# Patient Record
Sex: Female | Born: 1935 | Race: Black or African American | Hispanic: No | Smoking: Former smoker
Health system: Southern US, Community
[De-identification: ages and names within clinical notes are randomized; demographics above are authoritative.]

## PROBLEM LIST (undated history)

## (undated) ENCOUNTER — Emergency Department (HOSPITAL_COMMUNITY): Admission: EM | Payer: Medicare Other

## (undated) DIAGNOSIS — F0390 Unspecified dementia without behavioral disturbance: Secondary | ICD-10-CM

## (undated) DIAGNOSIS — H5450 Low vision, one eye, unspecified eye: Secondary | ICD-10-CM

## (undated) DIAGNOSIS — I2510 Atherosclerotic heart disease of native coronary artery without angina pectoris: Secondary | ICD-10-CM

## (undated) DIAGNOSIS — M1990 Unspecified osteoarthritis, unspecified site: Secondary | ICD-10-CM

## (undated) DIAGNOSIS — I10 Essential (primary) hypertension: Secondary | ICD-10-CM

## (undated) DIAGNOSIS — E785 Hyperlipidemia, unspecified: Secondary | ICD-10-CM

## (undated) DIAGNOSIS — H5440 Blindness, one eye, unspecified eye: Secondary | ICD-10-CM

## (undated) HISTORY — DX: Blindness, one eye, unspecified eye: H54.40

## (undated) HISTORY — DX: Low vision, one eye, unspecified eye: H54.50

## (undated) HISTORY — DX: Essential (primary) hypertension: I10

## (undated) HISTORY — DX: Hyperlipidemia, unspecified: E78.5

## (undated) HISTORY — PX: CARDIAC CATHETERIZATION: SHX172

## (undated) HISTORY — PX: TONSILLECTOMY: SUR1361

## (undated) HISTORY — PX: EYE SURGERY: SHX253

## (undated) HISTORY — PX: OCULAR PROSTHESIS REMOVAL: SHX364

## (undated) HISTORY — DX: Atherosclerotic heart disease of native coronary artery without angina pectoris: I25.10

## (undated) HISTORY — PX: DILATION AND CURETTAGE OF UTERUS: SHX78

---

## 1998-10-12 ENCOUNTER — Emergency Department (HOSPITAL_COMMUNITY): Admission: EM | Admit: 1998-10-12 | Discharge: 1998-10-12 | Payer: Self-pay | Admitting: Emergency Medicine

## 1998-10-23 ENCOUNTER — Ambulatory Visit (HOSPITAL_COMMUNITY): Admission: RE | Admit: 1998-10-23 | Discharge: 1998-10-23 | Payer: Self-pay | Admitting: Family Medicine

## 1998-10-23 ENCOUNTER — Encounter: Payer: Self-pay | Admitting: Family Medicine

## 2000-01-20 ENCOUNTER — Emergency Department (HOSPITAL_COMMUNITY): Admission: EM | Admit: 2000-01-20 | Discharge: 2000-01-20 | Payer: Self-pay | Admitting: Internal Medicine

## 2000-02-29 ENCOUNTER — Ambulatory Visit (HOSPITAL_COMMUNITY): Admission: RE | Admit: 2000-02-29 | Discharge: 2000-02-29 | Payer: Self-pay | Admitting: Family Medicine

## 2000-02-29 ENCOUNTER — Encounter: Payer: Self-pay | Admitting: Family Medicine

## 2003-02-16 ENCOUNTER — Encounter: Payer: Self-pay | Admitting: Emergency Medicine

## 2003-02-16 ENCOUNTER — Emergency Department (HOSPITAL_COMMUNITY): Admission: EM | Admit: 2003-02-16 | Discharge: 2003-02-16 | Payer: Self-pay | Admitting: Emergency Medicine

## 2003-04-06 ENCOUNTER — Encounter: Payer: Self-pay | Admitting: Emergency Medicine

## 2003-04-06 ENCOUNTER — Emergency Department (HOSPITAL_COMMUNITY): Admission: EM | Admit: 2003-04-06 | Discharge: 2003-04-06 | Payer: Self-pay | Admitting: Emergency Medicine

## 2003-07-28 ENCOUNTER — Emergency Department (HOSPITAL_COMMUNITY): Admission: EM | Admit: 2003-07-28 | Discharge: 2003-07-29 | Payer: Self-pay | Admitting: Emergency Medicine

## 2003-08-02 ENCOUNTER — Emergency Department (HOSPITAL_COMMUNITY): Admission: EM | Admit: 2003-08-02 | Discharge: 2003-08-02 | Payer: Self-pay | Admitting: Emergency Medicine

## 2003-08-09 ENCOUNTER — Emergency Department (HOSPITAL_COMMUNITY): Admission: EM | Admit: 2003-08-09 | Discharge: 2003-08-09 | Payer: Self-pay | Admitting: Emergency Medicine

## 2003-08-31 ENCOUNTER — Ambulatory Visit (HOSPITAL_COMMUNITY): Admission: RE | Admit: 2003-08-31 | Discharge: 2003-08-31 | Payer: Self-pay | Admitting: Specialist

## 2003-10-12 ENCOUNTER — Encounter: Admission: RE | Admit: 2003-10-12 | Discharge: 2003-10-31 | Payer: Self-pay | Admitting: Specialist

## 2004-03-14 ENCOUNTER — Ambulatory Visit (HOSPITAL_COMMUNITY): Admission: RE | Admit: 2004-03-14 | Discharge: 2004-03-14 | Payer: Self-pay | Admitting: Specialist

## 2007-04-03 ENCOUNTER — Inpatient Hospital Stay (HOSPITAL_COMMUNITY): Admission: EM | Admit: 2007-04-03 | Discharge: 2007-04-04 | Payer: Self-pay | Admitting: Emergency Medicine

## 2007-04-03 ENCOUNTER — Ambulatory Visit: Payer: Self-pay | Admitting: Hospitalist

## 2007-04-03 ENCOUNTER — Ambulatory Visit: Payer: Self-pay | Admitting: Cardiology

## 2007-04-09 ENCOUNTER — Ambulatory Visit: Payer: Self-pay

## 2007-04-22 ENCOUNTER — Encounter (INDEPENDENT_AMBULATORY_CARE_PROVIDER_SITE_OTHER): Payer: Self-pay | Admitting: Internal Medicine

## 2007-04-22 ENCOUNTER — Ambulatory Visit: Payer: Self-pay | Admitting: Internal Medicine

## 2007-04-22 ENCOUNTER — Ambulatory Visit: Payer: Self-pay | Admitting: Cardiology

## 2007-04-22 DIAGNOSIS — Z8679 Personal history of other diseases of the circulatory system: Secondary | ICD-10-CM | POA: Insufficient documentation

## 2007-04-22 DIAGNOSIS — H409 Unspecified glaucoma: Secondary | ICD-10-CM | POA: Insufficient documentation

## 2007-04-22 DIAGNOSIS — E785 Hyperlipidemia, unspecified: Secondary | ICD-10-CM | POA: Insufficient documentation

## 2007-04-22 DIAGNOSIS — I10 Essential (primary) hypertension: Secondary | ICD-10-CM | POA: Insufficient documentation

## 2007-04-22 LAB — CONVERTED CEMR LAB
BUN: 16 mg/dL (ref 6–23)
CO2: 26 meq/L (ref 19–32)
Calcium: 8.9 mg/dL (ref 8.4–10.5)
Creatinine, Ser: 0.98 mg/dL (ref 0.40–1.20)
Glucose, Bld: 91 mg/dL (ref 70–99)

## 2007-04-30 ENCOUNTER — Encounter (INDEPENDENT_AMBULATORY_CARE_PROVIDER_SITE_OTHER): Payer: Self-pay | Admitting: Internal Medicine

## 2007-04-30 ENCOUNTER — Ambulatory Visit: Payer: Self-pay | Admitting: Internal Medicine

## 2007-04-30 LAB — CONVERTED CEMR LAB
ALT: 16 units/L (ref 0–35)
AST: 16 units/L (ref 0–37)
Alkaline Phosphatase: 132 units/L — ABNORMAL HIGH (ref 39–117)
Cholesterol: 172 mg/dL (ref 0–200)
Creatinine, Ser: 1.03 mg/dL (ref 0.40–1.20)
LDL Cholesterol: 95 mg/dL (ref 0–99)
Sodium: 141 meq/L (ref 135–145)
Total Bilirubin: 0.5 mg/dL (ref 0.3–1.2)
Total CHOL/HDL Ratio: 2.9
Total Protein: 7 g/dL (ref 6.0–8.3)
VLDL: 17 mg/dL (ref 0–40)

## 2007-05-12 ENCOUNTER — Ambulatory Visit: Payer: Self-pay | Admitting: Internal Medicine

## 2007-05-12 ENCOUNTER — Encounter (INDEPENDENT_AMBULATORY_CARE_PROVIDER_SITE_OTHER): Payer: Self-pay | Admitting: Internal Medicine

## 2007-05-12 LAB — CONVERTED CEMR LAB
BUN: 19 mg/dL (ref 6–23)
Potassium: 3.8 meq/L (ref 3.5–5.3)
Sodium: 143 meq/L (ref 135–145)

## 2007-06-15 ENCOUNTER — Ambulatory Visit: Payer: Self-pay | Admitting: *Deleted

## 2007-06-24 ENCOUNTER — Encounter: Admission: RE | Admit: 2007-06-24 | Discharge: 2007-08-26 | Payer: Self-pay | Admitting: Ophthalmology

## 2007-10-05 ENCOUNTER — Encounter (INDEPENDENT_AMBULATORY_CARE_PROVIDER_SITE_OTHER): Payer: Self-pay | Admitting: Infectious Diseases

## 2007-10-05 ENCOUNTER — Ambulatory Visit: Payer: Self-pay | Admitting: Hospitalist

## 2007-10-05 LAB — CONVERTED CEMR LAB
AST: 21 units/L (ref 0–37)
Albumin: 4.3 g/dL (ref 3.5–5.2)
Alkaline Phosphatase: 110 units/L (ref 39–117)
Calcium: 9.5 mg/dL (ref 8.4–10.5)
Chloride: 107 meq/L (ref 96–112)
Glucose, Bld: 89 mg/dL (ref 70–99)
Potassium: 3.8 meq/L (ref 3.5–5.3)
Sodium: 144 meq/L (ref 135–145)
Total Protein: 7 g/dL (ref 6.0–8.3)

## 2008-03-29 ENCOUNTER — Ambulatory Visit: Payer: Self-pay | Admitting: Infectious Diseases

## 2008-03-29 DIAGNOSIS — G4700 Insomnia, unspecified: Secondary | ICD-10-CM | POA: Insufficient documentation

## 2008-03-30 ENCOUNTER — Telehealth: Payer: Self-pay | Admitting: *Deleted

## 2008-04-04 ENCOUNTER — Ambulatory Visit (HOSPITAL_COMMUNITY): Admission: RE | Admit: 2008-04-04 | Discharge: 2008-04-04 | Payer: Self-pay | Admitting: Internal Medicine

## 2008-04-04 ENCOUNTER — Encounter (INDEPENDENT_AMBULATORY_CARE_PROVIDER_SITE_OTHER): Payer: Self-pay | Admitting: Internal Medicine

## 2008-04-20 ENCOUNTER — Encounter (INDEPENDENT_AMBULATORY_CARE_PROVIDER_SITE_OTHER): Payer: Self-pay | Admitting: Internal Medicine

## 2008-06-02 ENCOUNTER — Telehealth (INDEPENDENT_AMBULATORY_CARE_PROVIDER_SITE_OTHER): Payer: Self-pay | Admitting: Internal Medicine

## 2008-06-10 ENCOUNTER — Ambulatory Visit: Payer: Self-pay | Admitting: Internal Medicine

## 2008-10-07 ENCOUNTER — Encounter (INDEPENDENT_AMBULATORY_CARE_PROVIDER_SITE_OTHER): Payer: Self-pay | Admitting: Internal Medicine

## 2008-10-07 ENCOUNTER — Ambulatory Visit: Payer: Self-pay | Admitting: Internal Medicine

## 2008-10-10 ENCOUNTER — Ambulatory Visit: Payer: Self-pay | Admitting: *Deleted

## 2008-10-10 ENCOUNTER — Telehealth (INDEPENDENT_AMBULATORY_CARE_PROVIDER_SITE_OTHER): Payer: Self-pay | Admitting: Internal Medicine

## 2008-10-10 LAB — CONVERTED CEMR LAB
AST: 18 units/L (ref 0–37)
Albumin: 4.4 g/dL (ref 3.5–5.2)
Alkaline Phosphatase: 113 units/L (ref 39–117)
LDL Cholesterol: 114 mg/dL — ABNORMAL HIGH (ref 0–99)
MCHC: 33.7 g/dL (ref 30.0–36.0)
Potassium: 4.1 meq/L (ref 3.5–5.3)
RBC: 4.5 M/uL (ref 3.87–5.11)
RDW: 13.4 % (ref 11.5–15.5)
Sodium: 142 meq/L (ref 135–145)
Total Protein: 7.2 g/dL (ref 6.0–8.3)

## 2009-02-14 ENCOUNTER — Ambulatory Visit: Payer: Self-pay | Admitting: Internal Medicine

## 2009-02-14 ENCOUNTER — Encounter (INDEPENDENT_AMBULATORY_CARE_PROVIDER_SITE_OTHER): Payer: Self-pay | Admitting: *Deleted

## 2009-02-14 DIAGNOSIS — I739 Peripheral vascular disease, unspecified: Secondary | ICD-10-CM | POA: Insufficient documentation

## 2009-02-14 LAB — CONVERTED CEMR LAB
ALT: 13 units/L (ref 0–35)
CO2: 26 meq/L (ref 19–32)
Calcium: 9 mg/dL (ref 8.4–10.5)
Chloride: 103 meq/L (ref 96–112)
GFR calc Af Amer: 52 mL/min — ABNORMAL LOW (ref >60)
HDL: 61 mg/dL (ref >39)
LDL Cholesterol: 80 mg/dL (ref 0–99)
Potassium: 3.4 meq/L — ABNORMAL LOW (ref 3.5–5.3)
Sodium: 143 meq/L (ref 135–145)
Total Protein: 7.5 g/dL (ref 6.0–8.3)

## 2009-02-20 ENCOUNTER — Encounter (INDEPENDENT_AMBULATORY_CARE_PROVIDER_SITE_OTHER): Payer: Self-pay | Admitting: *Deleted

## 2009-02-20 ENCOUNTER — Ambulatory Visit: Payer: Self-pay | Admitting: Vascular Surgery

## 2009-02-20 ENCOUNTER — Ambulatory Visit (HOSPITAL_COMMUNITY): Admission: RE | Admit: 2009-02-20 | Discharge: 2009-02-20 | Payer: Self-pay | Admitting: *Deleted

## 2009-03-01 ENCOUNTER — Encounter: Payer: Self-pay | Admitting: Internal Medicine

## 2009-03-01 ENCOUNTER — Ambulatory Visit: Payer: Self-pay | Admitting: Internal Medicine

## 2009-03-01 LAB — CONVERTED CEMR LAB
CO2: 25 meq/L (ref 19–32)
Calcium: 9.6 mg/dL (ref 8.4–10.5)
Chloride: 103 meq/L (ref 96–112)
Sodium: 142 meq/L (ref 135–145)

## 2009-03-24 ENCOUNTER — Ambulatory Visit: Payer: Self-pay | Admitting: Internal Medicine

## 2009-03-24 LAB — CONVERTED CEMR LAB
ALT: 29 units/L (ref 0–35)
CO2: 24 meq/L (ref 19–32)
Calcium: 9.3 mg/dL (ref 8.4–10.5)
Chloride: 104 meq/L (ref 96–112)
Creatinine, Ser: 1.01 mg/dL (ref 0.40–1.20)

## 2009-04-04 ENCOUNTER — Encounter: Admission: RE | Admit: 2009-04-04 | Discharge: 2009-05-04 | Payer: Self-pay | Admitting: Internal Medicine

## 2009-04-19 ENCOUNTER — Encounter: Payer: Self-pay | Admitting: Internal Medicine

## 2009-05-03 ENCOUNTER — Encounter: Payer: Self-pay | Admitting: Internal Medicine

## 2009-05-25 ENCOUNTER — Ambulatory Visit: Payer: Self-pay | Admitting: Internal Medicine

## 2009-10-10 ENCOUNTER — Ambulatory Visit: Payer: Self-pay | Admitting: Internal Medicine

## 2009-10-10 LAB — CONVERTED CEMR LAB
CO2: 26 meq/L (ref 19–32)
Calcium: 9.7 mg/dL (ref 8.4–10.5)
Cholesterol: 181 mg/dL (ref 0–200)
HDL: 70 mg/dL (ref >39)
Sodium: 142 meq/L (ref 135–145)
Total CHOL/HDL Ratio: 2.6
Triglycerides: 77 mg/dL (ref <150)

## 2009-10-25 ENCOUNTER — Ambulatory Visit (HOSPITAL_COMMUNITY): Admission: RE | Admit: 2009-10-25 | Discharge: 2009-10-25 | Payer: Self-pay | Admitting: Internal Medicine

## 2009-11-01 ENCOUNTER — Telehealth: Payer: Self-pay | Admitting: Internal Medicine

## 2009-12-27 ENCOUNTER — Telehealth: Payer: Self-pay | Admitting: Internal Medicine

## 2009-12-29 ENCOUNTER — Ambulatory Visit: Payer: Self-pay | Admitting: Internal Medicine

## 2010-03-22 ENCOUNTER — Ambulatory Visit: Payer: Self-pay | Admitting: Internal Medicine

## 2010-03-22 DIAGNOSIS — G5600 Carpal tunnel syndrome, unspecified upper limb: Secondary | ICD-10-CM | POA: Insufficient documentation

## 2010-04-25 ENCOUNTER — Telehealth: Payer: Self-pay | Admitting: Internal Medicine

## 2010-06-28 ENCOUNTER — Telehealth: Payer: Self-pay | Admitting: Internal Medicine

## 2010-08-10 ENCOUNTER — Ambulatory Visit: Payer: Self-pay | Admitting: Internal Medicine

## 2010-08-10 DIAGNOSIS — R42 Dizziness and giddiness: Secondary | ICD-10-CM | POA: Insufficient documentation

## 2010-09-25 NOTE — Assessment & Plan Note (Signed)
Summary: increased BP/pcp-magick/hla   Vital Signs:  Patient profile:   75 year old female Height:      64 inches (162.56 cm) Weight:      171.06 pounds (77.75 kg) BMI:     29.47 Temp:     97 degrees F (36.11 degrees C) oral BP sitting:   142 / 78  (right arm)  Vitals Entered By: Angelina Ok RN (Dec 29, 2009 1:57 PM) CC: Depression Is Patient Diabetic? No Pain Assessment Patient in pain? no      Nutritional Status BMI of 25 - 29 = overweight  Have you ever been in a relationship where you felt threatened, hurt or afraid?No   Does patient need assistance? Functional Status Self care Comments Gets stressed when she leaves the hous fro anything.  Had gone to [facility] the day her B/P was up.  Says that her head feels funny.  Here to have blood pressure checked.   Primary Care Provider:  Mliss Sax MD  CC:  Depression.  History of Present Illness: 15 year woman with pmh signifcant for HTN, HLD, Glaucoma, and non-obstructive CAD (nl myoview 2008) who presents to the clinic for the following.  Patient checked her BP at [facility] recently and her BP was is 150's and got scared and scheduled an appointment. She denies any other complaints. She reports that she has an appointment with ocularis this money and reports that she will get a new eye ball to her righteye.  No other complaints or concerns.   Depression History:      The patient denies a depressed mood most of the day and a diminished interest in her usual daily activities.        The patient denies that she feels like life is not worth living, denies that she wishes that she were dead, and denies that she has thought about ending her life.        Comments:  Down about blindness but goes on.   Preventive Screening-Counseling & Management  Alcohol-Tobacco     Smoking Status: quit     Year Quit: 22 YEARS AGO     Passive Smoke Exposure: no  Problems Prior to Update: 1)  Onychomycosis  (ICD-110.1) 2)  Intermittent  Claudication, Left Leg  (ICD-443.9) 3)  Uri  (ICD-465.9) 4)  Plantar Fasciitis, Bilateral  (ICD-728.71) 5)  Insomnia  (ICD-780.52) 6)  Back Pain  (ICD-724.5) 7)  Postmenopausal Status  (ICD-627.2) 8)  Chest Pain, Hx of  (ICD-V12.50) 9)  Glaucoma Nos  (ICD-365.9) 10)  Hypertension  (ICD-401.9) 11)  Hyperlipidemia  (ICD-272.4) 12)  Aftercare, Long-term Use, Medications Nec  (ICD-V58.69)  Medications Prior to Update: 1)  Hydrochlorothiazide 25 Mg  Tabs (Hydrochlorothiazide) .... Take 1 Tablet By Mouth Once A Day 2)  Atenolol 25 Mg  Tabs (Atenolol) .... Take 1 Tablet By Mouth Once A Day 3)  Vytorin 10-20 Mg Tabs (Ezetimibe-Simvastatin) .... Take 1 Tablet By Mouth Once A Day 4)  Alphagan P 0.1 %  Soln (Brimonidine Tartrate) .[name].. 1 Drop On Each Eye Twice A Day 5)  Pataday 0.2 %  Soln (Olopatadine Hcl) .... One Dropin Ight Eye Once A Day 6)  Cosopt 2-0.5 %  Soln (Dorzolamide-Timolol) .[name].. 1 Drop in Left Eye Twice A Day 7)  Lumigan 0.03 %  Soln (Bimatoprost) .[name].. 1 Drop in Left Eye Bedtime 8)  Nitroquick 0.4 Mg  Subl (Nitroglycerin) .... As Needed For Chest Pain Every 5 Minutes 9)  Norvasc 5 Mg  Tabs (Amlodipine Besylate) .... Take 1 Tablet By Mouth Once A Day 10)  Tylenol Extra Strength 500 Mg Tabs (Acetaminophen) .... Take One Pill Every 6 Hours As Needed For Pain. 11)  Flexeril 5 Mg Tabs (Cyclobenzaprine Hcl) .... Take One Pill Every By Mouth At Bedtime 12)  Arthrotec 75 75-200 Mg-Mcg Tabs (Diclofenac-Misoprostol) .... Take 1 Tablet By Mouth Two Times A Day 13)  Terbinafine Hcl 1 % Crea (Terbinafine Hcl) .... Please Apply A Small Amount Under Nail of Both Thumbs, Rub in Thoroughly Twice Daily.  Current Medications (verified): 1)  Hydrochlorothiazide 25 Mg  Tabs (Hydrochlorothiazide) .... Take 1 Tablet By Mouth Once A Day 2)  Atenolol 25 Mg  Tabs (Atenolol) .... Take 1 Tablet By Mouth Once A Day 3)  Vytorin 10-20 Mg Tabs (Ezetimibe-Simvastatin) .... Take 1 Tablet By Mouth Once A Day 4)   Alphagan P 0.1 %  Soln (Brimonidine Tartrate) .[name].. 1 Drop On Each Eye Twice A Day 5)  Pataday 0.2 %  Soln (Olopatadine Hcl) .... One Dropin Ight Eye Once A Day 6)  Cosopt 2-0.5 %  Soln (Dorzolamide-Timolol) .[name].. 1 Drop in Left Eye Twice A Day 7)  Lumigan 0.03 %  Soln (Bimatoprost) .[name].. 1 Drop in Left Eye Bedtime 8)  Nitroquick 0.4 Mg  Subl (Nitroglycerin) .... As Needed For Chest Pain Every 5 Minutes 9)  Norvasc 5 Mg  Tabs (Amlodipine Besylate) .... Take 1 Tablet By Mouth Once A Day 10)  Tylenol Extra Strength 500 Mg Tabs (Acetaminophen) .... Take One Pill Every 6 Hours As Needed For Pain. 11)  Flexeril 5 Mg Tabs (Cyclobenzaprine Hcl) .... Take One Pill Every By Mouth At Bedtime 12)  Arthrotec 75 75-200 Mg-Mcg Tabs (Diclofenac-Misoprostol) .... Take 1 Tablet By Mouth Two Times A Day 13)  Terbinafine Hcl 1 % Crea (Terbinafine Hcl) .... Please Apply A Small Amount Under Nail of Both Thumbs, Rub in Thoroughly Twice Daily.  Allergies (verified): 1)  ! Asa 2)  ! Codeine  Past History:  Risk Factors: Exercise: no (05/25/2009)  Risk Factors: Smoking Status: quit (12/29/2009) Passive Smoke Exposure: no (12/29/2009)  Past Medical History: Reviewed history from 06/15/2007 and no changes required. Hyperlipidemia Hypertension Nonobstructive CAD - myoview nl 03/2007 by [facility], EF = 75%  Review of Systems      See HPI  Physical Exam  General:  alert and well-developed.  legally blind, wearing subglasses.  Head:  normocephalic and atraumatic.   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 Mouth:  pharynx pink and moist.   Neck:  supple, full ROM, no masses, and no thyromegaly.   Lungs:  normal respiratory effort and normal breath sounds.   Heart:  normal rate, regular rhythm, no murmur, no gallop, no rub, and no JVD.   Abdomen:  soft, non-tender, normal bowel sounds, no distention, no masses, no guarding, and no rigidity.   Pulses:  R radial  normal.   Extremities:  no lower extremity edema  Neurologic:  alert & oriented X3, strength normal in all extremities, and gait normal.     Impression & Recommendations:  Problem # 1:  HYPERTENSION (ICD-401.9) Well controlled. Continue current regimen. Explained her about the positional changes in BP and adised her not to worry for values of 140or 150's.  Her updated medication list for this problem includes:    Hydrochlorothiazide 25 Mg Tabs (Hydrochlorothiazide) .[name]... Take 1 tablet by mouth once a day    Atenolol 25 Mg Tabs (  Atenolol) .[name]... Take 1 tablet by mouth once a day    Norvasc 5 Mg Tabs (Amlodipine besylate) .[name]... Take 1 tablet by mouth once a day  BP today: 142/78 Prior BP: 170/82 (10/10/2009)  Labs Reviewed: K+: 3.9 (10/10/2009) Creat: : 0.85 (10/10/2009)   Chol: 181 (10/10/2009)   HDL: 70 (10/10/2009)   LDL: 96 (10/10/2009)   TG: 77 (10/10/2009)  Problem # 2:  GLAUCOMA NOS (ICD-365.9) Follows with opthalmology in [place] and ocularis at [facility], No active issues.  Complete Medication List: 1)  Hydrochlorothiazide 25 Mg Tabs (Hydrochlorothiazide) .... Take 1 tablet by mouth once a day 2)  Atenolol 25 Mg Tabs (Atenolol) .... Take 1 tablet by mouth once a day 3)  Vytorin 10-20 Mg Tabs (Ezetimibe-simvastatin) .... Take 1 tablet by mouth once a day 4)  Alphagan P 0.1 % Soln (Brimonidine tartrate) .[name].. 1 drop on each eye twice a day 5)  Pataday 0.2 % Soln (Olopatadine hcl) .... One dropin ight eye once a day 6)  Cosopt 2-0.5 % Soln (Dorzolamide-timolol) .[name].. 1 drop in left eye twice a day 7)  Lumigan 0.03 % Soln (Bimatoprost) .[name].. 1 drop in left eye bedtime 8)  Nitroquick 0.4 Mg Subl (Nitroglycerin) .... As needed for chest pain every 5 minutes 9)  Norvasc 5 Mg Tabs (Amlodipine besylate) .... Take 1 tablet by mouth once a day 10)  Tylenol Extra Strength 500 Mg Tabs (Acetaminophen) .... Take one pill every 6 hours as needed for pain. 11)  Flexeril 5 Mg Tabs  (Cyclobenzaprine hcl) .... Take one pill every by mouth at bedtime 12)  Arthrotec 75 75-200 Mg-mcg Tabs (Diclofenac-misoprostol) .... Take 1 tablet by mouth two times a day 13)  Terbinafine Hcl 1 % Crea (Terbinafine hcl) .... Please apply a small amount under nail of both thumbs, rub in thoroughly twice daily.  Patient Instructions: 1)  Please schedule a follow-up appointment in 3 months. 2)  Take all the medications as advised below.   Vital Signs:  Patient profile:   75 year old female Height:      64 inches (162.56 cm) Weight:      171.06 pounds (77.75 kg) BMI:     29.47 Temp:     97 degrees F (36.11 degrees C) oral BP sitting:   142 / 78  (right arm)  Vitals Entered By: Angelina Ok RN (Dec 29, 2009 1:57 PM)   Prevention & Chronic Care Immunizations   Influenza vaccine: Fluvax 3+  (05/25/2009)   Influenza vaccine deferral: Deferred  (03/01/2009)   Influenza vaccine due: 04/26/2009    Tetanus booster: Not documented   Td booster deferral: Deferred  (03/01/2009)    Pneumococcal vaccine: Not documented   Pneumococcal vaccine deferral: Deferred  (03/01/2009)    H. zoster vaccine: Not documented   H. zoster vaccine deferral: Deferred  (03/01/2009)  Colorectal Screening   Hemoccult: Not documented   Hemoccult action/deferral: Deferred  (03/01/2009)    Colonoscopy: Not documented   Colonoscopy action/deferral: Refused  (10/10/2009)  Other Screening   Pap smear: Not documented   Pap smear action/deferral: Refused  (10/10/2009)    Mammogram: ASSESSMENT: Negative - BI-RADS 1^MM DIGITAL SCREENING  (10/25/2009)   Mammogram action/deferral: Ordered  (10/10/2009)   Mammogram due: 03/2009    DXA bone density scan: Not documented   Smoking status: quit  (12/29/2009)  Lipids   Total Cholesterol: 181  (10/10/2009)   Lipid panel action/deferral: Lipid Panel ordered   LDL: 96  (10/10/2009)  LDL Direct: Not documented   HDL: 70  (10/10/2009)   Triglycerides: 77   (10/10/2009)    SGOT (AST): 25  (03/24/2009)   SGPT (ALT): 29  (03/24/2009)   Alkaline phosphatase: 114  (03/24/2009)   Total bilirubin: 0.4  (03/24/2009)  Hypertension   Last Blood Pressure: 142 / 78  (12/29/2009)   Serum creatinine: 0.85  (10/10/2009)   BMP action: Ordered   Serum potassium 3.9  (10/10/2009)  Self-Management Support :   Personal Goals (by the next clinic visit) :      Personal blood pressure goal: 140/90  (10/10/2009)     Personal LDL goal: 70  (10/10/2009)    Patient will work on the following items until the next clinic visit to reach self-care goals:     Medications and monitoring: take my medicines every day, bring all of my medications to every visit  (12/29/2009)     Eating: drink diet soda or water instead of juice or soda, eat more vegetables, use fresh or frozen vegetables, eat foods that are low in salt, eat baked foods instead of fried foods, eat fruit for snacks and desserts, limit or avoid alcohol  (12/29/2009)     Activity: take a 30 minute walk every day  (12/29/2009)     Other: trying to watch salt but really misses it- treadmill for 1 hour a day  (05/25/2009)    Hypertension self-management support: Written self-care plan, Education handout, Pre-printed educational material, Resources for patients handout  (12/29/2009)   Hypertension self-care plan printed.   Hypertension education handout printed    Lipid self-management support: Written self-care plan, Education handout, Pre-printed educational material, Resources for patients handout  (12/29/2009)   Lipid self-care plan printed.   Lipid education handout printed      Resource handout printed.

## 2010-09-25 NOTE — Progress Notes (Signed)
Summary: immobilizer removal/ hla  Phone Note Call from Patient   Summary of Call: pt presents for appt scheduled 8/30, she states she thought it was today, she states this was a f/u from appt 7/28, back to remove immobilizer from left hand/forearm. she states hand is much improved, denies pain today, states a twinge every now and then but " no where near what it was", she desires to take immobilizer off, spoke w/ dr Josem Kaufmann, will allow pt to remove immobilizer w/ instructions that if pain starts again or any other problems arise w/ L hand and f/a she is to call clinic or go to ED or urg care. if no problems she will f/u w/ pcp at next available clinic day, that appt is scheduled today. pt is agreeable. she removed the immobilizer herself as i observed and states it feels fine. she is happy with not seeing a physician today. Initial call taken by: Marin Roberts RN,  April 25, 2010 3:40 PM  Follow-up for Phone Call        I spoke with Ms. Alfredo Bach concerning Ms. Hlavaty.  The plan for the visit yesterday was to reassess and possibly discontinue the shoulder immobilizer.  Ms. Kage reportedly is symptomatically improved so it is OK to discontinue it and follow symptoms.  If her shoulder pain recurs she should make an appointment to be seen in the clinic.  Otherwise she can follow-up with her PCP at the next available appointment. Follow-up by: Doneen Poisson MD,  April 25, 2010 4:56 PM

## 2010-09-25 NOTE — Progress Notes (Signed)
Summary: refill/ hla  Phone Note Refill Request Message from:  Fax from Pharmacy on November 01, 2009 2:04 PM  Refills Requested: Medication #1:  ARTHROTEC 75 75-200 MG-MCG TABS Take 1 tablet by mouth two times a day   Last Refilled: 2/15  Medication #2:  FLEXERIL 5 MG TABS Take one pill every by mouth at bedtime   Last Refilled: 2/15 last visit 2/15  Initial call taken by: Marin Roberts RN,  November 01, 2009 2:04 PM  Follow-up for Phone Call        completed Follow-up by: Mliss Sax MD,  November 01, 2009 2:50 PM    Prescriptions: FLEXERIL 5 MG TABS (CYCLOBENZAPRINE HCL) Take one pill every by mouth at bedtime  #14 x 3   Entered and Authorized by:   Mliss Sax MD   Signed by:   Mliss Sax MD on 11/01/2009   Method used:   Electronically to        [facility] [address] [id number]* (retail)       [address] [place], [place]  [address]       Ph: [phone]       Fax: [phone]   RxID:   [id number] ARTHROTEC 75 75-200 MG-MCG TABS (DICLOFENAC-MISOPROSTOL) Take 1 tablet by mouth two times a day  #30 x 3   Entered and Authorized by:   Mliss Sax MD   Signed by:   Mliss Sax MD on 11/01/2009   Method used:   Electronically to        [facility] [address] [id number]* (retail)       [address] [place], [place]  [address]       Ph: [phone]       Fax: [phone]   RxID:   [id number]

## 2010-09-25 NOTE — Assessment & Plan Note (Signed)
Summary: ACUTE-BACK PAIN/LEG PAIN(MAGICK)/CFB   Vital Signs:  Patient profile:   75 year old female Height:      64 inches (162.56 cm) Weight:      167.8 pounds (76.27 kg) BMI:     28.91 Temp:     98.6 degrees F (37.00 degrees C) Pulse rate:   72 / minute BP sitting:   170 / 82  (left arm) Cuff size:   large  Vitals Entered By: Krystal Eaton Duncan Dull) (October 10, 2009 10:58 AM) CC: pt c/o low back/right hip pain x5-6 days Is Patient Diabetic? No Pain Assessment Patient in pain? yes     Location: right hip/low back Intensity: 4 Type: dull Onset of pain  intermittent  x 5-6 days mostly with activity Nutritional Status BMI of 25 - 29 = overweight  Have you ever been in a relationship where you felt threatened, hurt or afraid?No   Does patient need assistance? Functional Status Cook/clean, Shopping Ambulation Impaired:Risk for fall Comments almost completely blind   Primary Care Provider:  Carlus Pavlov MD  CC:  pt c/o low back/right hip pain x5-6 days.  History of Present Illness: [name] is a 75 year old woman with pmh signifcant for HTN, HLD, Glaucoma, and non-obstructive CAD (nl myoview 2008) who presents today for general check up and back pain.  Patient has a long-standing history of back pain and has received PT in the past. Pt reports she was cleaning her closet and felt she had strained it. Injury occured 10/05/2009. Pt states the pain has improved, but is still causing discomfort. She also complains of skin irritation and cracking under her thumbs. She reports she washes her hands quite often and that it has been getting worse when she washes her hands.   No other complaints or concerns.   Preventive Screening-Counseling & Management  Alcohol-Tobacco     Smoking Status: quit     Year Quit: 22 YEARS AGO     Passive Smoke Exposure: no  Current Medications (verified): 1)  Hydrochlorothiazide 25 Mg  Tabs (Hydrochlorothiazide) .... Take 1/2 Tablet By  Mouth Once A Day 2)  Atenolol 25 Mg  Tabs (Atenolol) .... Take 1 Tablet By Mouth Once A Day 3)  Vytorin 10-20 Mg Tabs (Ezetimibe-Simvastatin) .... Take 1 Tablet By Mouth Once A Day 4)  Alphagan P 0.1 %  Soln (Brimonidine Tartrate) .[name].. 1 Drop On Each Eye Twice A Day 5)  Pataday 0.2 %  Soln (Olopatadine Hcl) .... One Dropin Ight Eye Once A Day 6)  Cosopt 2-0.5 %  Soln (Dorzolamide-Timolol) .[name].. 1 Drop in Left Eye Twice A Day 7)  Lumigan 0.03 %  Soln (Bimatoprost) .[name].. 1 Drop in Left Eye Bedtime 8)  Nitroquick 0.4 Mg  Subl (Nitroglycerin) .... As Needed For Chest Pain Every 5 Minutes 9)  Norvasc 5 Mg  Tabs (Amlodipine Besylate) .... Take 1 Tablet By Mouth Once A Day 10)  Tylenol Extra Strength 500 Mg Tabs (Acetaminophen) .... Take One Pill Every 6 Hours As Needed For Pain. 11)  Flexeril 5 Mg Tabs (Cyclobenzaprine Hcl) .... Take One Pill Every By Mouth At Bedtime 12)  Arthrotec 75 75-200 Mg-Mcg Tabs (Diclofenac-Misoprostol) .... Take 1 Tablet By Mouth Two Times A Day  Allergies: 1)  ! Asa 2)  ! Codeine  Past History:  Past Medical History: Last updated: 06/15/2007 Hyperlipidemia Hypertension Nonobstructive CAD - myoview nl 03/2007 by Valera [facility], EF = 75%  Risk Factors: Exercise: no (05/25/2009)  Risk Factors: Smoking Status:  quit (10/10/2009) Passive Smoke Exposure: no (10/10/2009)  Review of Systems CV:  Denies chest pain or discomfort, difficulty breathing at night, difficulty breathing while lying down, palpitations, and swelling of feet. GI:  Denies abdominal pain, constipation, diarrhea, nausea, and vomiting. MS:  Complains of low back pain. Derm:  Complains of changes in nail beds. Neuro:  Denies brief paralysis, falling down, numbness, tingling, and weakness.  Physical Exam  General:  alert and well-developed.   Head:  normocephalic and atraumatic.   Eyes:  glaucoma bilaterally, complete vision loss in right left, very little vision in left eye  Mouth:  pharynx pink  and moist.   Neck:  supple, full ROM, no masses, and no thyromegaly.   Lungs:  normal respiratory effort and normal breath sounds.   Heart:  normal rate, regular rhythm, no murmur, no gallop, no rub, and no JVD.   Abdomen:  soft, non-tender, normal bowel sounds, no distention, no masses, no guarding, and no rigidity.   Msk:  decreased ROM 2/2 to pain no joint swelling, no joint warmth, and decreased ROM.   Pulses:  R radial normal and L radial normal.   Extremities:  no lower extremity edema  Neurologic:  alert & oriented X3, cranial nerves II-XII intact, and strength normal in all extremities.   Skin:  cracking of skin under thumbs, under nail bed, no discolouration of nails   Impression & Recommendations:  Problem # 1:  BACK PAIN (ICD-724.5) Assessment Deteriorated Back pain 2/2 lumbar strain. No neurologic deficits. Advised patient to maintain activity, avoid complete bed rest, heat and ice packs. Will also give patient flexeril and arthotec as patient states these medications have helped in the past for acute back pain.   Her updated medication list for this problem includes:    Tylenol Extra Strength 500 Mg Tabs (Acetaminophen) .[name]... Take one pill every 6 hours as needed for pain.    Flexeril 5 Mg Tabs (Cyclobenzaprine hcl) .[name]... Take one pill every by mouth at bedtime    Arthrotec 75 75-200 Mg-mcg Tabs (Diclofenac-misoprostol) .[name]... Take 1 tablet by mouth two times a day  Problem # 2:  HYPERTENSION (ICD-401.9) Assessment: Deteriorated BP manually rechecked 140/90. Blood pressure is more elevated than baseline and this is likely due to pain. I instructed patient to go back to taking 1 tablet of HCTZ as opposed to 1/2 tab that she was taking before. Will check BMet for renal function and K.   Her updated medication list for this problem includes:    Hydrochlorothiazide 25 Mg Tabs (Hydrochlorothiazide) .[name]... Take 1 tablet by mouth once a day    Atenolol 25 Mg Tabs (Atenolol) .[name]...  Take 1 tablet by mouth once a day    Norvasc 5 Mg Tabs (Amlodipine besylate) .[name]... Take 1 tablet by mouth once a day  Orders: T-Basic Metabolic Panel [id number])  Problem # 3:  ONYCHOMYCOSIS (ICD-110.1) Assessment: New Cracked skin under nail bed of both thumbs. This is likely due to onychomycosis, however presents atypically, as there is no other fingernail involvement and there is no discolouration of nails. Will treat with topical terbinafine, and if it does not resolve will proceed to oral terbinafine.   Her updated medication list for this problem includes:    Terbinafine Hcl 1 % Crea (Terbinafine hcl) .[name]... Please apply a small amount under nail of both thumbs, rub in thoroughly twice daily.  Problem # 4:  HYPERLIPIDEMIA (ICD-272.4) Assessment: Improved Lipids are at goal. Will check Lipid panel today.  Her updated medication list for this problem includes:    Vytorin 10-20 Mg Tabs (Ezetimibe-simvastatin) .[name]... Take 1 tablet by mouth once a day  Orders: T-Lipid Profile [id number])  Labs Reviewed: SGOT: 25 (03/24/2009)   SGPT: 29 (03/24/2009)   HDL:61 (02/14/2009), 74 (10/07/2008)  LDL:80 (02/14/2009), 114 (46/27/0350)  Chol:160 (02/14/2009), 208 (10/07/2008)  Trig:97 (02/14/2009), 100 (10/07/2008)  Problem # 5:  Preventive Health Care (ICD-V70.0) Mammogram will be scheduled for patient.   Complete Medication List: 1)  Hydrochlorothiazide 25 Mg Tabs (Hydrochlorothiazide) .... Take 1 tablet by mouth once a day 2)  Atenolol 25 Mg Tabs (Atenolol) .... Take 1 tablet by mouth once a day 3)  Vytorin 10-20 Mg Tabs (Ezetimibe-simvastatin) .... Take 1 tablet by mouth once a day 4)  Alphagan P 0.1 % Soln (Brimonidine tartrate) .[name].. 1 drop on each eye twice a day 5)  Pataday 0.2 % Soln (Olopatadine hcl) .... One dropin ight eye once a day 6)  Cosopt 2-0.5 % Soln (Dorzolamide-timolol) .[name].. 1 drop in left eye twice a day 7)  Lumigan 0.03 % Soln (Bimatoprost) .[name].. 1 drop in left eye  bedtime 8)  Nitroquick 0.4 Mg Subl (Nitroglycerin) .... As needed for chest pain every 5 minutes 9)  Norvasc 5 Mg Tabs (Amlodipine besylate) .... Take 1 tablet by mouth once a day 10)  Tylenol Extra Strength 500 Mg Tabs (Acetaminophen) .... Take one pill every 6 hours as needed for pain. 11)  Flexeril 5 Mg Tabs (Cyclobenzaprine hcl) .... Take one pill every by mouth at bedtime 12)  Arthrotec 75 75-200 Mg-mcg Tabs (Diclofenac-misoprostol) .... Take 1 tablet by mouth two times a day 13)  Terbinafine Hcl 1 % Crea (Terbinafine hcl) .... Please apply a small amount under nail of both thumbs, rub in thoroughly twice daily.  Other Orders: Mammogram (Screening) (Mammo)  Patient Instructions: 1)  Please schedule a follow-up appointment in 6 months. 2)  Check your Blood Pressure regularly. If it is above 170: you should make an appointment. Prescriptions: TERBINAFINE HCL 1 % CREA (TERBINAFINE HCL) Please apply a small amount under nail of both thumbs, rub in thoroughly twice daily.  #1 bottle x 0   Entered and Authorized by:   Melida Quitter MD   Signed by:   Melida Quitter MD on 10/10/2009   Method used:   Electronically to        [facility] #[id number]* (retail)       [address] [place], [place]  [address]       Ph: [phone]       Fax: [phone]   RxID:   [id number] HYDROCHLOROTHIAZIDE 25 MG  TABS (HYDROCHLOROTHIAZIDE) Take 1 tablet by mouth once a day  #31 x 11   Entered and Authorized by:   Melida Quitter MD   Signed by:   Melida Quitter MD on 10/10/2009   Method used:   Electronically to        [facility] #[id number]* (retail)       [address] [place], [place]  [address]       Ph: [phone]       Fax: [phone]   RxID:   [id number] NORVASC 5 MG  TABS (AMLODIPINE BESYLATE) Take 1 tablet by mouth once a day  #31 x 11   Entered and Authorized by:   Melida Quitter MD   Signed by:   Melida Quitter MD on  10/10/2009   Method used:    Electronically to        [id number]* (retail)       [address] [place], [place]  [address]       Ph: [phone]       Fax: [phone]   RxID:   [id number] VYTORIN 10-20 MG TABS (EZETIMIBE-SIMVASTATIN) Take 1 tablet by mouth once a day  #30 x 11   Entered and Authorized by:   Melida Quitter MD   Signed by:   Melida Quitter MD on 10/10/2009   Method used:   Electronically to        [facility] #[id number]* (retail)       [address] [place], [place]  [address]       Ph: [phone]       Fax: [phone]   RxID:   [id number] ARTHROTEC 75 75-200 MG-MCG TABS (DICLOFENAC-MISOPROSTOL) Take 1 tablet by mouth two times a day  #30 x 0   Entered and Authorized by:   Melida Quitter MD   Signed by:   Melida Quitter MD on 10/10/2009   Method used:   Electronically to        [id number]* (retail)       [address] [place], [place]  [address]       Ph: [phone]       Fax: [phone]   RxID:   [id number] FLEXERIL 5 MG TABS (CYCLOBENZAPRINE HCL) Take one pill every by mouth at bedtime  #14 x 0   Entered and Authorized by:   Melida Quitter MD   Signed by:   Melida Quitter MD on 10/10/2009   Method used:   Electronically to        [facility] #[id number]* (retail)       [address] [place], [place]  [address]       Ph: [phone]       Fax: [phone]   RxID:   [id number]  Process Orders Check Orders Results:     [facility]: Check successful Tests Sent for requisitioning (October 10, 2009 12:11 PM):     10/10/2009: [facility] -- T-Lipid Profile [phone] (signed)     10/10/2009: [facility] -- T-Basic Metabolic Panel [phone] (signed)    Prevention & Chronic Care Immunizations   Influenza vaccine: Fluvax 3+  (05/25/2009)   Influenza vaccine deferral: Deferred  (03/01/2009)   Influenza vaccine due: 04/26/2009    Tetanus  booster: Not documented   Td booster deferral: Deferred  (03/01/2009)    Pneumococcal vaccine: Not documented   Pneumococcal vaccine deferral: Deferred  (03/01/2009)    H. zoster vaccine: Not documented   H. zoster vaccine deferral: Deferred  (03/01/2009)  Colorectal Screening   Hemoccult: Not documented   Hemoccult action/deferral: Deferred  (03/01/2009)    Colonoscopy: Not documented   Colonoscopy action/deferral: Refused  (10/10/2009)  Other Screening   Pap smear: Not documented   Pap smear action/deferral: Refused  (10/10/2009)    Mammogram: No specific mammographic evidence of malignancy.  Assessment: BIRADS 1.   (04/04/2008)   Mammogram action/deferral: Ordered  (10/10/2009)   Mammogram due: 03/2009    DXA bone density scan: Not documented   Smoking status: quit  (10/10/2009)  Lipids   Total Cholesterol: 160  (  02/14/2009)   Lipid panel action/deferral: Lipid Panel ordered   LDL: 80  (02/14/2009)   LDL Direct: Not documented   HDL: 61  (02/14/2009)   Triglycerides: 97  (02/14/2009)    SGOT (AST): 25  (03/24/2009)   SGPT (ALT): 29  (03/24/2009)   Alkaline phosphatase: 114  (03/24/2009)   Total bilirubin: 0.4  (03/24/2009)    Lipid flowsheet reviewed?: Yes   Progress toward LDL goal: At goal  Hypertension   Last Blood Pressure: 170 / 82  (10/10/2009)   Serum creatinine: 1.01  (03/24/2009)   BMP action: Ordered   Serum potassium 3.2  (03/24/2009)    Hypertension flowsheet reviewed?: Yes   Progress toward BP goal: Deteriorated  Self-Management Support :   Personal Goals (by the next clinic visit) :      Personal blood pressure goal: 140/90  (10/10/2009)     Personal LDL goal: 70  (10/10/2009)    Patient will work on the following items until the next clinic visit to reach self-care goals:     Medications and monitoring: take my medicines every day  (10/10/2009)     Eating: eat more vegetables, eat foods that are low in salt, eat baked foods instead of  fried foods  (10/10/2009)     Other: trying to watch salt but really misses it- treadmill for 1 hour a day  (05/25/2009)    Hypertension self-management support: BP self-monitoring log, Written self-care plan  (10/10/2009)   Hypertension self-care plan printed.    Lipid self-management support: Written self-care plan  (10/10/2009)   Lipid self-care plan printed.   Nursing Instructions: Schedule screening mammogram (see order)    Process Orders Check Orders Results:     [facility]: Check successful Tests Sent for requisitioning (October 10, 2009 12:11 PM):     10/10/2009: [facility] -- T-Lipid Profile [phone] (signed)     10/10/2009: [facility] -- T-Basic Metabolic Panel [id number] (signed)

## 2010-09-25 NOTE — Progress Notes (Signed)
Summary: refill/gg  Phone Note Refill Request  on June 28, 2010 4:39 PM  Refills Requested: Medication #1:  NORVASC 5 MG  TABS Take 1 tablet by mouth once a day  Medication #2:  VYTORIN 10-20 MG TABS Take 1 tablet by mouth once a day  Medication #3:  ATENOLOL 25 MG  TABS Take 1 tablet by mouth once a day  Medication #4:  HYDROCHLOROTHIAZIDE 25 MG  TABS Take 1 tablet by mouth once a day **New mail order pharmacy   Method Requested: Electronic Initial call taken by: Merrie Roof RN,  June 28, 2010 4:42 PM  Follow-up for Phone Call        completed refill, thank you Iskra  Follow-up by: Mliss Sax MD,  June 29, 2010 8:34 AM    Prescriptions: NORVASC 5 MG  TABS (AMLODIPINE BESYLATE) Take 1 tablet by mouth once a day  #31 x 11   Entered and Authorized by:   Mliss Sax MD   Signed by:   Mliss Sax MD on 06/29/2010   Method used:   Electronically to        [facility] (mail-order)       [facility]       [address]       [place], [place]  [address]       Ph: [phone]       Fax: [phone]   RxID:   [id number] VYTORIN 10-20 MG TABS (EZETIMIBE-SIMVASTATIN) Take 1 tablet by mouth once a day  #30 x 3   Entered and Authorized by:   Mliss Sax MD   Signed by:   Mliss Sax MD on 06/29/2010   Method used:   Electronically to        [facility]* (mail-order)       [facility]       [address]       [place], [place]  [address]       Ph: [phone]       Fax: [phone]   RxID:   [id number] ATENOLOL 25 MG  TABS (ATENOLOL) Take 1 tablet by mouth once a day  #31 x 5   Entered and Authorized by:   Mliss Sax MD   Signed by:   Mliss Sax MD on 06/29/2010   Method used:   Electronically to        [facility]* (mail-order)       [facility]       [address]       [place], [place]  [address]       Ph: [phone]       Fax: [phone]  RxID:   [id number] HYDROCHLOROTHIAZIDE 25 MG  TABS (HYDROCHLOROTHIAZIDE) Take 1 tablet by mouth once a day  #31 x 11   Entered and Authorized by:   Mliss Sax MD   Signed by:   Mliss Sax MD on 06/29/2010   Method used:   Electronically to        [facility] (mail-order)       [facility]       [address]       [place], [place]  [address]       Ph: [phone]       Fax: [phone]   RxID:   [id number]

## 2010-09-25 NOTE — Progress Notes (Signed)
Summary: bp, visit/ hla  Phone Note Call from Patient   Summary of Call: pt calls to say she checked [facility] bp at [facility] and it was 150/90, wants appt, given for 5/6 Initial call taken by: Marin Roberts RN,  Dec 27, 2009 4:54 PM  Follow-up for Phone Call        OK. will follow up. Follow-up by: Blondell Reveal MD,  Dec 28, 2009 8:40 AM

## 2010-09-25 NOTE — Assessment & Plan Note (Signed)
Summary: left hand hurting/cfb/vega   Vital Signs:  Patient profile:   75 year old female Height:      64 inches (162.56 cm) Weight:      163.2 pounds (74.18 kg) BMI:     28.11 Temp:     97.0 degrees F (36.11 degrees C) oral Pulse rate:   78 / minute BP sitting:   151 / 83  (right arm)  Vitals Entered By: Stanton Kidney Ditzler RN (March 22, 2010 3:15 PM) Is Patient Diabetic? No Pain Assessment Patient in pain? yes     Location: left hand and wrist Intensity: 8 Type: dull-sharp Onset of pain  past 3 weeks Nutritional Status BMI of 25 - 29 = overweight Nutritional Status Detail appetite good  Have you ever been in a relationship where you felt threatened, hurt or afraid?denies   Does patient need assistance? Functional Status Self care Ambulation Impaired:Risk for fall Comments Blind - limited eye sight. Ck left hand and refills on meds. Out of BP med 2-3 days.   Primary Care Provider:  Mliss Sax MD   History of Present Illness: Pt is a 75 y/o woman with PMH/problems as outlined in EMR.  Pt comes to the clinic today with c/o  - L wrist and hand pain: x 2 weeks.         Dull pain at wrist at day, worsens at night. Sharp shooting pain starting from L wrist and going to hand and fingers which sometimes happens due to some movements of wrist,  and also at night.. She woke up due to pain from sleep twice in last 2 weeks. She is retired and doesn't do any work which requires repeated wrist movements.  No redness, swelling, trauma of wrist  or hand.  Denies any fever,cough,chest pain,SOB, abd pain, N/V,diarrhea, urinary abn, anorexia, wt loss, headache.     Depression History:      The patient denies a depressed mood most of the day and a diminished interest in her usual daily activities.         Preventive Screening-Counseling & Management  Alcohol-Tobacco     Smoking Status: quit     Year Quit: 22 YEARS AGO     Passive Smoke Exposure: no  Caffeine-Diet-Exercise  Does Patient Exercise: no     Type of exercise: WALKING     Times/week: 2  Problems Prior to Update: 1)  Carpal Tunnel Syndrome  (ICD-354.0) 2)  Intermittent Claudication, Left Leg  (ICD-443.9) 3)  Insomnia  (ICD-780.52) 4)  Back Pain  (ICD-724.5) 5)  Postmenopausal Status  (ICD-627.2) 6)  Chest Pain, Hx of  (ICD-V12.50) 7)  Glaucoma Nos  (ICD-365.9) 8)  Hypertension  (ICD-401.9) 9)  Hyperlipidemia  (ICD-272.4) 10)  Aftercare, Long-term Use, Medications Nec  (ICD-V58.69)  Medications Prior to Update: 1)  Hydrochlorothiazide 25 Mg  Tabs (Hydrochlorothiazide) .... Take 1 Tablet By Mouth Once A Day 2)  Atenolol 25 Mg  Tabs (Atenolol) .... Take 1 Tablet By Mouth Once A Day 3)  Vytorin 10-20 Mg Tabs (Ezetimibe-Simvastatin) .... Take 1 Tablet By Mouth Once A Day 4)  Alphagan P 0.1 %  Soln (Brimonidine Tartrate) .[name].. 1 Drop On Each Eye Twice A Day 5)  Pataday 0.2 %  Soln (Olopatadine Hcl) .... One Dropin Ight Eye Once A Day 6)  Cosopt 2-0.5 %  Soln (Dorzolamide-Timolol) .[name].. 1 Drop in Left Eye Twice A Day 7)  Lumigan 0.03 %  Soln (Bimatoprost) .[name].. 1 Drop in Left Eye Bedtime 8)  Nitroquick 0.4 Mg  Subl (Nitroglycerin) .... As Needed For Chest Pain Every 5 Minutes 9)  Norvasc 5 Mg  Tabs (Amlodipine Besylate) .... Take 1 Tablet By Mouth Once A Day 10)  Tylenol Extra Strength 500 Mg Tabs (Acetaminophen) .... Take One Pill Every 6 Hours As Needed For Pain. 11)  Flexeril 5 Mg Tabs (Cyclobenzaprine Hcl) .... Take One Pill Every By Mouth At Bedtime 12)  Arthrotec 75 75-200 Mg-Mcg Tabs (Diclofenac-Misoprostol) .... Take 1 Tablet By Mouth Two Times A Day 13)  Terbinafine Hcl 1 % Crea (Terbinafine Hcl) .... Please Apply A Small Amount Under Nail of Both Thumbs, Rub in Thoroughly Twice Daily.  Current Medications (verified): 1)  Hydrochlorothiazide 25 Mg  Tabs (Hydrochlorothiazide) .... Take 1 Tablet By Mouth Once A Day 2)  Atenolol 25 Mg  Tabs (Atenolol) .... Take 1 Tablet By Mouth Once A Day 3)   Vytorin 10-20 Mg Tabs (Ezetimibe-Simvastatin) .... Take 1 Tablet By Mouth Once A Day 4)  Alphagan P 0.1 %  Soln (Brimonidine Tartrate) .[name].. 1 Drop On Each Eye Twice A Day 5)  Pataday 0.2 %  Soln (Olopatadine Hcl) .... One Dropin Ight Eye Once A Day 6)  Cosopt 2-0.5 %  Soln (Dorzolamide-Timolol) .[name].. 1 Drop in Left Eye Twice A Day 7)  Lumigan 0.03 %  Soln (Bimatoprost) .[name].. 1 Drop in Left Eye Bedtime 8)  Nitroquick 0.4 Mg  Subl (Nitroglycerin) .... As Needed For Chest Pain Every 5 Minutes 9)  Norvasc 5 Mg  Tabs (Amlodipine Besylate) .... Take 1 Tablet By Mouth Once A Day 10)  Tylenol Extra Strength 500 Mg Tabs (Acetaminophen) .... Take One Pill Every 6 Hours As Needed For Pain. 11)  Flexeril 5 Mg Tabs (Cyclobenzaprine Hcl) .... Take One Pill Every By Mouth At Bedtime  Allergies: 1)  ! Asa 2)  ! Codeine  Review of Systems       as per HPI.[name]  Physical Exam  General:  alert and well-developed.  legally blind, wearing sunglasses.  Head:  normocephalic and atraumatic.   Lungs:  normal respiratory effort and normal breath sounds.   Heart:  normal rate, regular rhythm, no murmur, no gallop, no rub. Abdomen:  soft, non-tender, normal bowel sounds, no distention, no masses, no guarding, and no rigidity.   Msk:  normal ROM, no joint tenderness, and no joint swelling.   Neurologic:  alert & oriented X3.     Impression & Recommendations:  Problem # 1:  CARPAL TUNNEL SYNDROME (ICD-354.0) As per HPI, she has wrist and hand pain. She doesn't have hypothyroidism symptoms or any frequent wrist use, but even from her symptoms description, she most proably has CTS. Debra Ditzler gave her a wrist splint and educated her how to use it and she went out of clinic wearing that. Told her to use it especially at night, but if comfortable can also use at day.  Will f/u in 4 weeks and reassess her.  Problem # 2:  HYPERTENSION (ICD-401.9)  She was out of her meds since last 2 days and so probably her BP was a  bit high from the preious one. Refilled all required meds. Will recheck her BP at next visit and make appropriate changes if needed.  Her updated medication list for this problem includes:    Hydrochlorothiazide 25 Mg Tabs (Hydrochlorothiazide) .[name]... Take 1 tablet by mouth once a day    Atenolol 25 Mg Tabs (Atenolol) .[name]... Take 1 tablet by mouth once a day  Norvasc 5 Mg Tabs (Amlodipine besylate) .[name]... Take 1 tablet by mouth once a day  BP today: 151/83 Prior BP: 142/78 (12/29/2009)  Labs Reviewed: K+: 3.9 (10/10/2009) Creat: : 0.85 (10/10/2009)   Chol: 181 (10/10/2009)   HDL: 70 (10/10/2009)   LDL: 96 (10/10/2009)   TG: 77 (10/10/2009)  Complete Medication List: 1)  Hydrochlorothiazide 25 Mg Tabs (Hydrochlorothiazide) .... Take 1 tablet by mouth once a day 2)  Atenolol 25 Mg Tabs (Atenolol) .... Take 1 tablet by mouth once a day 3)  Vytorin 10-20 Mg Tabs (Ezetimibe-simvastatin) .... Take 1 tablet by mouth once a day 4)  Alphagan P 0.1 % Soln (Brimonidine tartrate) .[name].. 1 drop on each eye twice a day 5)  Pataday 0.2 % Soln (Olopatadine hcl) .... One dropin ight eye once a day 6)  Cosopt 2-0.5 % Soln (Dorzolamide-timolol) .[name].. 1 drop in left eye twice a day 7)  Lumigan 0.03 % Soln (Bimatoprost) .[name].. 1 drop in left eye bedtime 8)  Nitroquick 0.4 Mg Subl (Nitroglycerin) .... As needed for chest pain every 5 minutes 9)  Norvasc 5 Mg Tabs (Amlodipine besylate) .... Take 1 tablet by mouth once a day 10)  Tylenol Extra Strength 500 Mg Tabs (Acetaminophen) .... Take one pill every 6 hours as needed for pain. 11)  Flexeril 5 Mg Tabs (Cyclobenzaprine hcl) .... Take one pill every by mouth at bedtime  Patient Instructions: 1)  Please schedule a follow-up appointment in 1 month. 2)  For your wrist pain, please use the splint at night as explained. If your pain doesn't improve or gets worse we will do some tests to check for any problems. Prescriptions: TYLENOL EXTRA STRENGTH 500 MG TABS  (ACETAMINOPHEN) Take one pill every 6 hours as needed for pain.  #100 x 1   Entered and Authorized by:   Lyn Hollingshead   Signed by:   Lyn Hollingshead on 03/22/2010   Method used:   Electronically to        [id number]* (retail)       [address] [place], [place]  [address]       Ph: [phone]       Fax: [phone]   RxID:   [id number] VYTORIN 10-20 MG TABS (EZETIMIBE-SIMVASTATIN) Take 1 tablet by mouth once a day  #30 x 3   Entered and Authorized by:   Lyn Hollingshead   Signed by:   Lyn Hollingshead on 03/22/2010   Method used:   Electronically to        [id number]* (retail)       [address] [place], [place]  [address]       Ph: [phone]       Fax: [phone]   RxID:   [id number] ATENOLOL 25 MG  TABS (ATENOLOL) Take 1 tablet by mouth once a day  #31 x 5   Entered and Authorized by:   Lyn Hollingshead   Signed by:   Lyn Hollingshead on 03/22/2010   Method used:   Electronically to        [id number]* (retail)       [address] [place], [place]  [address]       Ph: [phone]       Fax: [phone]   RxID:   [id number]    Prevention & Chronic Care Immunizations  Influenza vaccine: Fluvax 3+  (05/25/2009)   Influenza vaccine deferral: Deferred  (03/01/2009)   Influenza vaccine due: 04/26/2009    Tetanus booster: Not documented   Td booster deferral: Deferred  (03/01/2009)    Pneumococcal vaccine: Not documented   Pneumococcal vaccine deferral: Deferred  (03/01/2009)    H. zoster vaccine: Not documented   H. zoster vaccine deferral: Deferred  (03/01/2009)  Colorectal Screening   Hemoccult: Not documented   Hemoccult action/deferral: Deferred  (03/01/2009)    Colonoscopy: Not documented   Colonoscopy action/deferral: Refused  (10/10/2009)  Other Screening   Pap smear: Not documented   Pap smear action/deferral: Refused  (10/10/2009)    Mammogram: ASSESSMENT: Negative - BI-RADS 1^MM DIGITAL  SCREENING  (10/25/2009)   Mammogram action/deferral: Ordered  (10/10/2009)   Mammogram due: 03/2009    DXA bone density scan: Not documented   Smoking status: quit  (03/22/2010)  Lipids   Total Cholesterol: 181  (10/10/2009)   Lipid panel action/deferral: Lipid Panel ordered   LDL: 96  (10/10/2009)   LDL Direct: Not documented   HDL: 70  (10/10/2009)   Triglycerides: 77  (10/10/2009)    SGOT (AST): 25  (03/24/2009)   SGPT (ALT): 29  (03/24/2009)   Alkaline phosphatase: 114  (03/24/2009)   Total bilirubin: 0.4  (03/24/2009)    Lipid flowsheet reviewed?: Yes   Progress toward LDL goal: Unchanged  Hypertension   Last Blood Pressure: 151 / 83  (03/22/2010)   Serum creatinine: 0.85  (10/10/2009)   BMP action: Ordered   Serum potassium 3.9  (10/10/2009)    Hypertension flowsheet reviewed?: Yes   Progress toward BP goal: Deteriorated  Self-Management Support :   Personal Goals (by the next clinic visit) :      Personal blood pressure goal: 140/90  (10/10/2009)     Personal LDL goal: 70  (10/10/2009)    Patient will work on the following items until the next clinic visit to reach self-care goals:     Medications and monitoring: take my medicines every day, bring all of my medications to every visit, weigh myself weekly  (03/22/2010)     Eating: eat more vegetables, use fresh or frozen vegetables, eat baked foods instead of fried foods, eat fruit for snacks and desserts, limit or avoid alcohol  (03/22/2010)     Activity: take a 30 minute walk every day, take the stairs instead of the elevator  (03/22/2010)     Other: trying to watch salt but really misses it- treadmill for 1 hour a day  (05/25/2009)    Hypertension self-management support: Resources for patients handout  (03/22/2010)    Lipid self-management support: Resources for patients handout  (03/22/2010)        Resource handout printed.

## 2010-09-27 NOTE — Assessment & Plan Note (Signed)
Summary: pharm change, norvasc change, dizziness,weak/pcp-magick/hla   Vital Signs:  Patient profile:   75 year old female Height:      64 inches Weight:      150.9 pounds BMI:     26.00 Temp:     98.0 degrees F oral Pulse rate:   62 / minute BP supine:   130 / 60 BP sitting:   138 / 71  (right arm) BP standing:   130 / 60  Vitals Entered ByFilomena Jungling NT II (August 10, 2010 3:30 PM) CC: ?medications-3 days Is Patient Diabetic? No Pain Assessment Patient in pain? no       Does patient need assistance? Functional Status Self care Ambulation Impaired:Risk for fall Comments patient has one eye that she has some vision in   Primary Care Provider:  Mliss Sax MD  CC:  ?medications-3 days.  History of Present Illness: Pt is a 75 y/o woman w. pmh outlined below presenting with the following:  -Lightheadedness - pt states that this has been ongoing for the past week after her pharmarcy switched her from "Norvasc to Amlodipine." She states she noticed this more when she goes from a sitting position to standing. However, she states it has been getting better since then and wants to make sure she didn't have anything else going on. She denies having any headaches, palpitations, fever, weakness, abnormal bleeding, dark stools, increased fatigue, recent trauma, h/o falls, or other systemic symptoms.  I explained to her that Amlodipine was the generic name for Norvasc and essentially they are the same medicine in terms of their pharmacology. She then explains to me that she's been under a tremendous amount of stress lately with personal issues at home. She has also started a new diet called "food lovers for life," where she's having to eat small portions every 2-3hrs. No other complaints today.    Current Problems (verified): 1)  Dizziness  (ICD-780.4) 2)  Carpal Tunnel Syndrome  (ICD-354.0) 3)  Intermittent Claudication, Left Leg  (ICD-443.9) 4)  Insomnia  (ICD-780.52) 5)   Back Pain  (ICD-724.5) 6)  Postmenopausal Status  (ICD-627.2) 7)  Chest Pain, Hx of  (ICD-V12.50) 8)  Glaucoma Nos  (ICD-365.9) 9)  Hypertension  (ICD-401.9) 10)  Hyperlipidemia  (ICD-272.4) 11)  Aftercare, Long-term Use, Medications Nec  (ICD-V58.69)  Current Medications (verified): 1)  Hydrochlorothiazide 25 Mg  Tabs (Hydrochlorothiazide) .... Take 1 Tablet By Mouth Once A Day 2)  Atenolol 25 Mg  Tabs (Atenolol) .... Take 1 Tablet By Mouth Once A Day 3)  Vytorin 10-20 Mg Tabs (Ezetimibe-Simvastatin) .... Take 1 Tablet By Mouth Once A Day 4)  Alphagan P 0.1 %  Soln (Brimonidine Tartrate) .[name].. 1 Drop On Each Eye Twice A Day 5)  Pataday 0.2 %  Soln (Olopatadine Hcl) .... One Dropin Ight Eye Once A Day 6)  Cosopt 2-0.5 %  Soln (Dorzolamide-Timolol) .[name].. 1 Drop in Left Eye Twice A Day 7)  Lumigan 0.03 %  Soln (Bimatoprost) .[name].. 1 Drop in Left Eye Bedtime 8)  Nitroquick 0.4 Mg  Subl (Nitroglycerin) .... As Needed For Chest Pain Every 5 Minutes 9)  Norvasc 5 Mg  Tabs (Amlodipine Besylate) .... Take 1 Tablet By Mouth Once A Day 10)  Tylenol Extra Strength 500 Mg Tabs (Acetaminophen) .... Take One Pill Every 6 Hours As Needed For Pain. 11)  Flexeril 5 Mg Tabs (Cyclobenzaprine Hcl) .... Take One Pill Every By Mouth At Bedtime  Allergies (verified): 1)  ! Asa 2)  !  Codeine  Past History:  Past Medical History: Last updated: 06/15/2007 Hyperlipidemia Hypertension Nonobstructive CAD - myoview nl 03/2007 by [facility], EF = 75%  Risk Factors: Exercise: no (03/22/2010)  Risk Factors: Smoking Status: quit (03/22/2010) Passive Smoke Exposure: no (03/22/2010)  Review of Systems      See HPI  Physical Exam  General:  alert, well-developed, well-nourished, and well-hydrated.  alert, well-developed, well-nourished, and well-hydrated.   Head:  normocephalic and atraumatic.  normocephalic and atraumatic.   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   Ears:  no external deformities.  no external deformities.   Nose:  no external deformity.   Lungs:  normal respiratory effort and normal breath sounds.  normal respiratory effort and normal breath sounds.   Heart:  normal rate and regular rhythm.  normal rate and regular rhythm.   Abdomen:  soft and non-tender.  soft and non-tender.   Pulses:  normal peripheral pulses Extremities:  no cyanosis or edema Neurologic:  alert & oriented X3, strength normal in all extremities, and sensation intact to light touch.  alert & oriented X3, strength normal in all extremities, and sensation intact to light touch.   Skin:  color normal.  color normal.   Psych:  normally interactive, not anxious appearing, and not depressed appearing.  normally interactive, not anxious appearing, and not depressed appearing.     Impression & Recommendations:  Problem # 1:  DIZZINESS (ICD-780.4) More likely a type of nonspecific dizziness associated with some anxiety from social stressors at home. Also could be excercabated by her visual impairment especially in this elderly patient. Her orthostatic vital signs are also negative. Explained to her that this is not likely due to her Amlodipine, encouraged her to do her best to limit stress, stay hydrated and to be observant of these symptoms. If it persists, then she needs to call the clinic and this can be further worked up in the event that there is an organic explanation.   Problem # 2:  HYPERTENSION (ICD-401.9)  Excellent control, continue current regimen.  Her updated medication list for this problem includes:    Hydrochlorothiazide 25 Mg Tabs (Hydrochlorothiazide) .[name]... Take 1 tablet by mouth once a day    Atenolol 25 Mg Tabs (Atenolol) .[name]... Take 1 tablet by mouth once a day    Norvasc 5 Mg Tabs (Amlodipine besylate) .[name]... Take 1 tablet by mouth once a day  BP today: 138/71 Prior BP: 151/83 (03/22/2010)  Labs Reviewed: K+: 3.9 (10/10/2009) Creat: :  0.85 (10/10/2009)   Chol: 181 (10/10/2009)   HDL: 70 (10/10/2009)   LDL: 96 (10/10/2009)   TG: 77 (10/10/2009)  Her updated medication list for this problem includes:    Hydrochlorothiazide 25 Mg Tabs (Hydrochlorothiazide) .[name]... Take 1 tablet by mouth once a day    Atenolol 25 Mg Tabs (Atenolol) .[name]... Take 1 tablet by mouth once a day    Norvasc 5 Mg Tabs (Amlodipine besylate) .[name]... Take 1 tablet by mouth once a day  Complete Medication List: 1)  Hydrochlorothiazide 25 Mg Tabs (Hydrochlorothiazide) .... Take 1 tablet by mouth once a day 2)  Atenolol 25 Mg Tabs (Atenolol) .... Take 1 tablet by mouth once a day 3)  Vytorin 10-20 Mg Tabs (Ezetimibe-simvastatin) .... Take 1 tablet by mouth once a day 4)  Alphagan P 0.1 % Soln (Brimonidine tartrate) .[name].. 1 drop on each eye twice a day 5)  Pataday 0.2 % Soln (Olopatadine hcl) .... One  dropin ight eye once a day 6)  Cosopt 2-0.5 % Soln (Dorzolamide-timolol) .[name].. 1 drop in left eye twice a day 7)  Lumigan 0.03 % Soln (Bimatoprost) .[name].. 1 drop in left eye bedtime 8)  Nitroquick 0.4 Mg Subl (Nitroglycerin) .... As needed for chest pain every 5 minutes 9)  Norvasc 5 Mg Tabs (Amlodipine besylate) .... Take 1 tablet by mouth once a day 10)  Tylenol Extra Strength 500 Mg Tabs (Acetaminophen) .... Take one pill every 6 hours as needed for pain. 11)  Flexeril 5 Mg Tabs (Cyclobenzaprine hcl) .... Take one pill every by mouth at bedtime  Patient Instructions: 1)  The good news is that your blood pressure is great and you are not orthostatic. Your stress may be causing you to feel a bit dizzy. Make sure to continue to stay hydrated and if your dizziness does not improve over the next 3 to 4 weeks, please call the clinic. 2)  Make a followup appointment with your PCP (Dr. Aldine Contes) at next available.   Orders Added: 1)  Est. Patient Level II [04540]

## 2010-10-08 ENCOUNTER — Encounter: Payer: Self-pay | Admitting: Internal Medicine

## 2010-10-09 ENCOUNTER — Encounter: Payer: Self-pay | Admitting: Internal Medicine

## 2010-10-10 ENCOUNTER — Encounter: Payer: Self-pay | Admitting: Internal Medicine

## 2010-10-12 ENCOUNTER — Encounter: Payer: Self-pay | Admitting: Internal Medicine

## 2010-10-12 ENCOUNTER — Ambulatory Visit (INDEPENDENT_AMBULATORY_CARE_PROVIDER_SITE_OTHER): Payer: Medicare Other | Admitting: Internal Medicine

## 2010-10-12 VITALS — BP 147/75 | HR 65 | Temp 97.0°F | Ht 64.0 in | Wt 148.7 lb

## 2010-10-12 DIAGNOSIS — I10 Essential (primary) hypertension: Secondary | ICD-10-CM

## 2010-10-12 DIAGNOSIS — E785 Hyperlipidemia, unspecified: Secondary | ICD-10-CM

## 2010-10-12 LAB — LIPID PANEL
HDL: 56 mg/dL (ref >39)
LDL Cholesterol: 91 mg/dL (ref 0–99)
Triglycerides: 81 mg/dL (ref <150)
VLDL: 16 mg/dL (ref 0–40)

## 2010-10-12 LAB — COMPREHENSIVE METABOLIC PANEL
Alkaline Phosphatase: 110 U/L (ref 39–117)
BUN: 18 mg/dL (ref 6–23)
Creat: 0.87 mg/dL (ref 0.40–1.20)
Glucose, Bld: 96 mg/dL (ref 70–99)
Total Bilirubin: 0.4 mg/dL (ref 0.3–1.2)

## 2010-10-12 NOTE — Progress Notes (Signed)
  Subjective:    Patient ID: [name], female    DOB: 1936-07-28, 75 y.o.   MRN: [id number]  HPI   patient is a 75 year old female with past medical history outlined below who presents to clinic for regular follow up on her blood pressure and cholesterol. She was seen here approximately one month ago for dizziness which she reports now is resolved. She reports compliance with her blood pressure and cholesterol medications and also reports compliance with recommended diet. She denies recent sicknesses or hospitalizations, no episodes of chest pain, no shortness of breath, no abdominal or urinary concerns. In addition she reports being independent carrying out activities of daily living as usual. No concerns at the time.  Review of Systems  Constitutional: Negative.   HENT: Negative.   Respiratory: Negative.   Cardiovascular: Negative.   Gastrointestinal: Negative.   Genitourinary: Negative.   Musculoskeletal: Negative.   Neurological: Negative.        Objective:   Physical Exam  Constitutional: She appears well-developed and well-nourished. No distress.  HENT:  Head: Normocephalic and atraumatic.  Right Ear: External ear normal.  Left Ear: External ear normal.  Nose: Nose normal.  Mouth/Throat: Oropharynx is clear and moist. No oropharyngeal exudate.  Eyes: Conjunctivae and EOM are normal. Pupils are equal, round, and reactive to light. Right eye exhibits no discharge. Left eye exhibits no discharge. No scleral icterus.  Cardiovascular: Normal rate, regular rhythm, normal heart sounds and intact distal pulses.  Exam reveals no gallop and no friction rub.   No murmur heard. Pulmonary/Chest: Effort normal and breath sounds normal. No respiratory distress. She has no wheezes. She has no rales. She exhibits no tenderness.  Abdominal: Soft. Bowel sounds are normal. She exhibits no distension and no mass. There is no tenderness. There is no rebound and no guarding.  Musculoskeletal:  Normal range of motion. She exhibits no edema and no tenderness.  Skin: Skin is warm and dry. No rash noted. She is not diaphoretic. No erythema. No pallor.  Psychiatric: She has a normal mood and affect. Her behavior is normal. Judgment and thought content normal.          Assessment & Plan:

## 2010-10-12 NOTE — Patient Instructions (Signed)
Please schedule follow up appointment in 6 months.  

## 2010-10-12 NOTE — Assessment & Plan Note (Signed)
Great control of cholesterol. Patient denies any known side effects of the medication. We'll check fasting lipid panel today and will readjust the regimen as indicated.

## 2010-10-12 NOTE — Assessment & Plan Note (Signed)
Slightly above goal today however, patient reports checking her blood pressure regularly and the numbers are usually within normal limits. Will not make any changes to her medication regimen today, I will continue to follow up on blood pressure control. Will check electrolyte panel today to ensure that her electrolytes are within normal limits.

## 2011-01-08 NOTE — Assessment & Plan Note (Signed)
[name]                            CARDIOLOGY OFFICE NOTE   [name], [name]                       MRN:          [id number]  DATE:04/22/2007                            DOB:          1935-11-02    [name] comes back today after being discharged from the hospital  with chest discomfort.  She ruled out for myocardial infarction.   She was arranged to have an outpatient Adenosine Myoview, which she had  on April 09, 2007.  EF was 75%, no ischemia.  No scar.  Normal  contractility and thickening in all areas of the myocardium.   Her risk factors include hypertension, hyperlipidemia.   CURRENT MEDICATIONS:  1. HCTZ 25 mg a day.  2. Atenolol 25 mg a day.  3. Vytorin 10/10 daily.  4. Potassium 20 mEq a day.  5. Prinivil 5 mg a day.   PHYSICAL EXAMINATION:  Her blood pressure today is 115/79, her pulse is  69 and regular.  Weight is 165.  HEENT:  She is legally blind, otherwise nothing remarkable.  NECK:  No JVD.  Carotid upstrokes are equal bilaterally without bruits.  LUNGS:  Clear.  HEART:  Normal S1 and S2.  ABDOMEN:  Soft with good bowel sounds.  EXTREMITIES:  Trace edema.  Pulses are intact.  NEURO:  Grossly intact.   EKG is normal today except for some nonspecific ST segment changes.   Chest x-ray at the hospital showed no infiltrate or heart failure.  She  had mild cardiomegaly.  She had a calcified aortic knob.   Her labs were unremarkable.  Of note, her total cholesterol was 140 and  triglycerides 67, HDL 47, LDL 80.   ASSESSMENT/PLAN:  [name] has nonobstructive coronary disease.  Her  symptoms of chest discomfort were probably not due to her heart.  I have  gone over the findings of her stress  Myoview with her and her daughter as well as modification of risk  factors and the importance of continuing her current meds.  We will see  her back on a p.r.n. basis.     Thomas C. Daleen Squibb, MD, [facility]  Electronically  Signed    TCW/MedQ  DD: 04/22/2007  DT: 04/23/2007  Job #: [id number]   cc:   [facility]

## 2011-01-08 NOTE — Consult Note (Signed)
[name], POSS              ACCOUNT NO.:  [id number]   MEDICAL RECORD NO.:  [id number]          PATIENT TYPE:  INP   LOCATION:  3740                         FACILITY:  [facility]   PHYSICIAN:  Gerrit Friends. Dietrich Pates, MD, FACCDATE OF BIRTH:  1936-01-15   DATE OF CONSULTATION:  DATE OF DISCHARGE:                                 CONSULTATION   PRIMARY CARE:  [facility] [employer].   HISTORY OF PRESENT ILLNESS:  The patient is a 75 year old woman referred  for assessment of chest pain.  [name] has no known cardiovascular  disease.  She experienced similar symptoms approximately 20 years ago,  prompting coronary angiography that was reportedly normal.  She has not  had significant cardiopulmonary symptoms since, has not been seen by a  cardiologist and has not undergone any subsequent cardiac testing.  She  does have risk factors including hyperlipidemia and hypertension that  have been adequately treated medically.  She has longstanding blindness  related to glaucoma.   She was in her usual state of good health sinus until this morning when  she experienced the sudden onset of substernal chest pressure while she  was taking her daily walk.  The symptoms are described as having been  fairly impressive with moderate-to-severe intensity and associated  dyspnea, diaphoresis and palpitations.  Nonetheless she finished her  walk, was transported to her primary care physician by her sister and  presented for what had been a previously scheduled appointment.  She was  given sublingual nitroglycerin with improvement of her symptoms, but the  discomfort subsequently recurred and waxed and waned.  She was  transported to the emergency department by ambulance where symptoms  persisted.  EKGs at both [facility] and in the emergency department  showed no acute changes.  She was given more nitroglycerin with some  benefit.  After a number of hours, the pain gradually resolved.  Initial  cardiac markers have been negative.  The patient was not given aspirin  due to GI intolerance.   PAST MEDICAL HISTORY:  Otherwise unremarkable.   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  No excessive alcohol.   FAMILY HISTORY:  Positive for colon cancer and Parkinson in her mother  and a fatal myocardial infarction in her father at age 68.  She has 3  siblings with hypertension.   REVIEW OF SYSTEMS:  Notable for some blurred vision, some dizziness,  urinary frequency and GERD symptoms.  All other systems were reviewed  and are negative.  She does not take GI medications chronically.   PHYSICAL EXAMINATION:  GENERAL:  Pleasant, well-appearing woman.  VITAL SIGNS:  Temperature is 97.3, heart rate 60 and regular,  respirations 18, blood pressure 140/75, O2 saturation 100% on room air.  HEENT:  Dark glasses; normal oral mucosa.  NECK:  No jugular venous distension; normal carotid upstrokes without  bruits.  ENDOCRINE:  No thyromegaly.  HEMATOPOIETIC:  No adenopathy.  SKIN:  No significant lesions.  PSYCHIATRIC:  Alert and oriented; normal affect.  LUNGS:  Minimal left basilar rales.  CARDIAC:  Normal first and second heart sounds; fourth heart sound  present.  ABDOMEN:  Soft and nontender; no organomegaly; no masses; no bruits.  EXTREMITIES:  Normal distal pulses; no edema.  NEUROMUSCULAR:  Symmetric strength and tone; normal cranial nerves.   CHEST X-RAY:  Mild apical pleural thickening; calcification in the  aortic arch; no acute abnormalities.  Mild cardiomegaly.   ELECTROCARDIOGRAM:  Normal sinus rhythm; minor nonspecific ST-T wave  abnormality.   LABORATORY:  Otherwise notable for negative cardiac markers, normal  hemoglobin and hematocrit and a normal chemistry profile.  D-dimer is   also normal.   IMPRESSION:  [name] presents with impressive symptoms but no EKG  changes even during chest discomfort, a benign exam and initial negative  markers.  The options of cardiac catheterization and stress testing were  presented to her.  She strongly prefers stress testing which is  reasonable as long as her cardiac markers remain normal and her EKG does  not change.  She will stay in the hospital overnight.  If test results  remain benign, she can be discharged in the morning with a prescription  for nitroglycerin and plans for an outpatient stress test and Cardiology  followup.   The patient is complaining of a severe headache.  Nitrates are probably  not offering her benefit and will be discontinued.      Gerrit Friends. Dietrich Pates, MD, [facility]  Electronically Signed     RMR/MEDQ  D:  04/03/2007  T:  04/05/2007  Job:  [id number]

## 2011-01-08 NOTE — Discharge Summary (Signed)
[name], [name]              ACCOUNT NO.:  [id number]   MEDICAL RECORD NO.:  [id number]          PATIENT TYPE:  INP   LOCATION:  3740                         FACILITY:  [facility]   PHYSICIAN:  Eliseo Gum, M.D.   DATE OF BIRTH:  Jun 05, 1936   DATE OF ADMISSION:  04/03/2007  DATE OF DISCHARGE:  04/04/2007                               DISCHARGE SUMMARY   DISCHARGE SUMMARY:  1. Stable angina.  2. Hypertension.  3. Hyperlipidemia.  4. Coma.   DISCHARGE MEDICATIONS:  1. Hydrochlorothiazide 25 mg p.o. daily.  2. Atenolol 25 mg p.o. daily.  3. Vytorin 10/10 p.o. daily.  4. Potassium 20 mEq p.o. daily.  5. Alphagan 1 drop one eye b.i.d.  6. Pataday 1 drop right eye once a day.  7. Cosopt 1 drop left eye twice a day.  8. Lumigan 1 drop in left eye q.h.s.  9. Aspirin 81 mg p.o. daily.  10.Nitroglycerin 0.4 mg sublingual x3 every 5 minutes as needed for      chest pain.   DISPOSITION AND FOLLOWUP:  At the time of discharge the patient had no  further events of chest pain.  Repeat EKGs were normal.  Serial cardiac  enzyme labs were negative.  A repeat, again, chest x-ray showed no  abnormalities and patient continued her cardioprotective medications of  hydrochlorothiazide, atenolol, Vytorin as well as her potassium.  Followup for patient will be with Dr. Robb Matar on the 27th of August at  10:30 in the [facility].  She will be contacted by the  clinic and Dr. Robb Matar will question her on whether or not her chest pain  has resolved and she has had her Cardiolite test.  Second followup will  be with [facility] for a Cardiolite stress test to further  investigate etiology of her chest pain.   IMAGES PERFORMED:  On April 03, 2007, chest x-ray showed no cardiomegaly  and a calcified aortic knob.   PROCEDURES PERFORMED:  EKG x3 showed normal sinus rhythm with no ST  elevations.   CONSULTATIONS:  Cardiology, Dr. Dietrich Pates.   BRIEF H&P:  [name] is a 75 year old African-American female with a  past medical history of hypertension, hyperlipidemia, glaucoma and  cardiac cath, which she said were in the 1980's, who presented to the  [facility] Emergency Department after having unremitting chest pain  since 7 a.m.  The pain started while she was taking her daily morning  walk and did not subside until she rested.  She described the pain as  substernal, left side greater than right pressure that sometimes  radiated to her neck and a 6/10 on the pain scale with shortness of  breath and weakness in her legs.  She denies nausea, fever, diaphoresis  or syncopal events.  In the ED, she was in stable condition but  continued to have pain, she received nitroglycerin ointment across her  chest which relieved her chest pain.  As well, she also received 2L of  O2 by nasal cannula and was immediately worked up for chest pain  protocol which included a CBC, CMET,  cardiac enzyme panel as well as  repeat EKGs and a D-dimer.  The values of the D-dimer were 0.34.  The  CBC showed a hemoglobin of 15.3 and a hematocrit of 45.  The CMET was  within normal limits.  The EKG showed questionable baseline and possible  ST elevation and normal sinus rhythm.  Repeat EKGs showed no ST  elevation.  A chest x-ray was also taken which showed no acute  processes.   PROBLEM LIST:  1. Chest pain.  The patient presented to the [facility] Emergency      Department with several hours of new onset chest pain with and      without exertion that was relieved by nitroglycerin ointment in the      Emergency Department.  Differential diagnosis includes myocardial      infarction, pulmonary embolism, pleuritic irritation, stable      angina, unstable angina and gastroesophageal reflux disease.  Based      on the negative D-dimer as well as the 100% O2 saturation,      pulmonary embolism is very unlikely.  Pleuritic irritation could be      a cause but the chest x-ray was normal  and the patient had no acute      infection such as pneumonia.  It was most likely not      gastroesophageal reflux disease due to the fact that the patient      had not eaten during the day and has had previous episodes of      heartburn in the past and described the pain as nothing similar.      As far as myocardial infarction goes, myocardial infarction was      ruled out based on the negative laboratories for cardiac enzymes as      well as the negative EKG which did not show ST elevation.  Based on      the patient's previous medical history of stable angina that      required a cath in the 1980's as well as her physical examination      and laboratory findings, the diagnosis of stable angina is the most      likely.  Patient did not receive aspirin during her stay due to      stated intolerance, which she claimed was gastritis.  She did      receive chest pain protocol with support laboratories that included      a CBC, CMET, EKG, chest x-ray and D-dimer which showed no acute      coronary or cardiac problems.  She also received support oxygen and      was put on telemetry over night and had no unusual cardiac events.      Based on the patient's symptoms, a Cardiology consult was requested      and both Internal Medicine as well as the Cardiology consult felt      that it would be necessary or advisable to follow up with the      patient to perform a Cardiolite stress test to further investigate      the etiology of her chest pain.  2. Hypertension.  The patient was mildly hypertensive on admission      with a blood pressure of 142/78 which lowered to 131/66 with      nitroglycerin ointment.  During her hospital stay Ms. Kot      received atenolol 25 mg and hydrochlorothiazide 25 mg.  Her blood  pressure was stable and well controlled during her hospital stay.  3. Hyperlipidemia.  [name] has a history of hyperlipidemia which      she reported to be well controlled.   During her hospital course she      received her Vytorin 10/10.  Her fasting lipid panel showed a      cholesterol equal to 140, triglyceride equal to 67, HDL equal to      47, LDL equal to 80 and VLDL equal to 13.  4. Glaucoma.  Patient has a long history of glaucoma which has led to      her blindness.  She could not retell her medical regimen and her      sister stated that she would bring medications from home.   DISCHARGE LABS AND VITALS:  White blood cell count of 4.5, hemoglobin of  13.8, hematocrit of 39.7 and a platelet of 250,000.  Discharge CMET of  sodium was 141, potassium was low at 3.3, chloride of 107, bicarbonate  of 26, BUN of 13, creatinine of 0.76 and the glucose of 85, calcium 8.7.  Discharge vitals are temperature 97.5, pulse of 68, blood pressure  142/68, respiratory rate of 28 and O2 sat 97% on room air.   PHYSICAL EXAMINATION:  INTAKE VITAL SIGNS:  A temperature of 97.3, blood  pressure of 142/78, pulse of 60, respiratory rate of 18 with 100% O2 sat  on room air.  GENERAL:  The patient is an elderly African-American female who is lying  comfortably in bed in no acute distress.  HEENT:  Deferred.  NECK:  No JVD.  Trachea midline.  No carotid bruits bilaterally.  RESPIRATORY:  The patient was clear to auscultation bilaterally with  good inspiratory effort.  CARDIOVASCULAR:  Regular rate and rhythm with no rubs, murmurs or  gallops.  ABDOMEN:  Soft, nontender, bowel sounds were positive.  EXTREMITIES:  The patient had 2+ pulses in the radial and posterior  tibialis arteries bilaterally and patient did not have any discoloration  in her nail beds.  SKIN:  Deferred.  NEURO:  Grossly intact.   INITIAL LABORATORY FINDINGS:  An H&H was taken.  A hemoglobin of 15.3, a  hematocrit of 45.  The CMET readings as followed:  Sodium 139,  potassium 3.9, chloride 103, BUN of 16, glucose of 97, a venous pH of  7.398, a pCO2 venous of 47.1, bicarbonate of 29, a D-dimer of  0.34.  First cardiac marker showed a myoglobin of 89.1, a CK MB of less than 1  and a troponin of -0.5.  A second serial cardiac enzyme showed myoglobin  of 90.6, CK MB 1.2 and troponin I of less than 0.05.     ______________________________  Dewayne Shorter, M.D.  Electronically Signed    PF/MEDQ  D:  04/06/2007  T:  04/06/2007  Job:  [id number]

## 2011-02-19 ENCOUNTER — Other Ambulatory Visit (INDEPENDENT_AMBULATORY_CARE_PROVIDER_SITE_OTHER): Payer: Medicare Other | Admitting: *Deleted

## 2011-02-19 DIAGNOSIS — I10 Essential (primary) hypertension: Secondary | ICD-10-CM

## 2011-02-19 MED ORDER — ATENOLOL 25 MG PO TABS: 25.0000 mg | ORAL_TABLET | Freq: Every day | ORAL | Status: DC

## 2011-04-01 ENCOUNTER — Other Ambulatory Visit: Payer: Self-pay | Admitting: *Deleted

## 2011-04-03 MED ORDER — EZETIMIBE-SIMVASTATIN 10-20 MG PO TABS: 1.0000 | ORAL_TABLET | Freq: Every day | ORAL | Status: DC

## 2011-04-04 ENCOUNTER — Ambulatory Visit (INDEPENDENT_AMBULATORY_CARE_PROVIDER_SITE_OTHER): Payer: Medicare Other | Admitting: Internal Medicine

## 2011-04-04 ENCOUNTER — Encounter: Payer: Self-pay | Admitting: Internal Medicine

## 2011-04-04 DIAGNOSIS — I10 Essential (primary) hypertension: Secondary | ICD-10-CM

## 2011-04-04 DIAGNOSIS — E785 Hyperlipidemia, unspecified: Secondary | ICD-10-CM

## 2011-04-04 NOTE — Progress Notes (Signed)
  Subjective:    Patient ID: [name], female    DOB: 14-Jun-1936, 75 y.o.   MRN: [id number]  HPI  Patient is 75 year old female with history outlined below who presents to clinic for regular followup on her blood pressure. She reports compliance with her blood pressure medications as well as other medicines and recommended diet and exercise. She denies recent significant hospitalizations, no episodes of chest pain or shortness of breath, no abdominal or urinary concerns, no weakness or dizziness. She reports being active and staying hydrated.  Review of Systems Constitutional: Denies fever, chills, diaphoresis, appetite change and fatigue.  HEENT: Denies photophobia, eye pain, redness, hearing loss, ear pain, congestion, sore throat, rhinorrhea, sneezing, mouth sores, trouble swallowing, neck pain, neck stiffness and tinnitus.   Respiratory: Denies SOB, DOE, cough, chest tightness,  and wheezing.   Cardiovascular: Denies chest pain, palpitations and leg swelling.  Gastrointestinal: Denies nausea, vomiting, abdominal pain, diarrhea, constipation, blood in stool and abdominal distention.  Genitourinary: Denies dysuria, urgency, frequency, hematuria, flank pain and difficulty urinating.  Musculoskeletal: Denies myalgias, back pain, joint swelling, arthralgias and gait problem.  Skin: Denies pallor, rash and wound.  Neurological: Denies dizziness, seizures, syncope, weakness, light-headedness, numbness and headaches.  Hematological: Denies adenopathy. Easy bruising, personal or family bleeding history  Psychiatric/Behavioral: Denies suicidal ideation, mood changes, confusion, nervousness, sleep disturbance and agitation       Objective:   Physical Exam  Constitutional: Vital signs reviewed.  Patient is a well-developed and well-nourished in no acute distress and cooperative with exam. Alert and oriented x3.  Neck: Supple, Trachea midline normal ROM, No JVD, mass, thyromegaly, or carotid  bruit present.  Cardiovascular: RRR, S1 normal, S2 normal, no MRG, pulses symmetric and intact bilaterally Pulmonary/Chest: CTAB, no wheezes, rales, or rhonchi Abdominal: Soft. Non-tender, non-distended, bowel sounds are normal, no masses, organomegaly, or guarding present.  Neurological: A&O x3, Strenght is normal and symmetric bilaterally, cranial nerve II-XII are grossly intact, no focal motor deficit, sensory intact to light touch bilaterally.  Skin: Warm, dry and intact. No rash, cyanosis, or clubbing.  Psychiatric: Normal mood and affect. speech and behavior is normal. Judgment and thought content normal. Cognition and memory are normal.         Assessment & Plan:

## 2011-04-04 NOTE — Assessment & Plan Note (Signed)
Blood pressure is stable on current medication regimen. We'll continue the same medicines for now. Advised patient to continue checking her blood pressure at home regularly and to call his back if the numbers are higher than 140/90. Electrolytes were recently checked and were within normal limits. No need to check the panel on this visit but recommend rechecking on next visit in 6 months.

## 2011-04-04 NOTE — Assessment & Plan Note (Signed)
Cholesterol, LDL at goal on current medication regimen. We will continue the same. Patient denies any known side effects of the current medication. I recommend reassessing fasting lipid panel in 6 months.

## 2011-04-15 ENCOUNTER — Telehealth: Payer: Self-pay | Admitting: *Deleted

## 2011-04-15 NOTE — Telephone Encounter (Signed)
Pt's pharmacy does not carry the stregth of Vytorin requested.  Pt will also need a Prior Authorization for the Vytorin as well.  Pharmacy sugested a different medication if possible.  Pt said that she would like to get something else instead as well.  She does not want to get the medication from an outside pharmacy.

## 2011-04-16 MED ORDER — SIMVASTATIN 40 MG PO TABS: 40.0000 mg | ORAL_TABLET | Freq: Every day | ORAL | Status: DC

## 2011-06-10 LAB — COMPREHENSIVE METABOLIC PANEL
ALT: 18
AST: 20
Albumin: 3.7
Alkaline Phosphatase: 130 — ABNORMAL HIGH
CO2: 29
Chloride: 103
Creatinine, Ser: 0.82
GFR calc Af Amer: >60
GFR calc non Af Amer: >60
Potassium: 3.6
Total Bilirubin: 0.7

## 2011-06-10 LAB — CBC
HCT: 39.7
HCT: 40.4
Hemoglobin: 14
MCV: 87.7
Platelets: 250
Platelets: 289
RBC: 4.51
WBC: 4.5
WBC: 5.6

## 2011-06-10 LAB — CARDIAC PANEL(CRET KIN+CKTOT+MB+TROPI)
CK, MB: 0.9
Troponin I: 0.03
Troponin I: <0.01

## 2011-06-10 LAB — LIPID PANEL
HDL: 47
Total CHOL/HDL Ratio: 3
Triglycerides: 67

## 2011-06-10 LAB — I-STAT 8, (EC8 V) (CONVERTED LAB)
Acid-Base Excess: 3 — ABNORMAL HIGH
Chloride: 103
HCT: 45
Operator id: 234501
Potassium: 3.9
TCO2: 30
pCO2, Ven: 47.1

## 2011-06-10 LAB — BASIC METABOLIC PANEL
BUN: 13
CO2: 26
Chloride: 107
Glucose, Bld: 85
Potassium: 3.3 — ABNORMAL LOW

## 2011-06-10 LAB — CK TOTAL AND CKMB (NOT AT ARMC): Relative Index: 1.1

## 2011-06-10 LAB — DIFFERENTIAL
Basophils Absolute: 0.1
Basophils Relative: 1
Eosinophils Absolute: 0
Eosinophils Relative: 1
Monocytes Absolute: 0.4

## 2011-06-10 LAB — TROPONIN I: Troponin I: 0.01

## 2011-06-10 LAB — POCT I-STAT CREATININE
Creatinine, Ser: 1
Operator id: 234501

## 2011-06-10 LAB — POCT CARDIAC MARKERS: Operator id: 234501

## 2011-06-27 ENCOUNTER — Encounter: Payer: Self-pay | Admitting: Ophthalmology

## 2011-06-27 ENCOUNTER — Ambulatory Visit (INDEPENDENT_AMBULATORY_CARE_PROVIDER_SITE_OTHER): Payer: Medicare Other | Admitting: Ophthalmology

## 2011-06-27 VITALS — BP 133/71 | HR 69 | Temp 96.8°F | Ht 65.0 in | Wt 136.0 lb

## 2011-06-27 DIAGNOSIS — E785 Hyperlipidemia, unspecified: Secondary | ICD-10-CM

## 2011-06-27 DIAGNOSIS — H5440 Blindness, one eye, unspecified eye: Secondary | ICD-10-CM

## 2011-06-27 DIAGNOSIS — Z9001 Acquired absence of eye: Secondary | ICD-10-CM

## 2011-06-27 DIAGNOSIS — H409 Unspecified glaucoma: Secondary | ICD-10-CM

## 2011-06-27 DIAGNOSIS — H547 Unspecified visual loss: Secondary | ICD-10-CM

## 2011-06-27 DIAGNOSIS — Z0000 Encounter for general adult medical examination without abnormal findings: Secondary | ICD-10-CM

## 2011-06-27 DIAGNOSIS — R4681 Obsessive-compulsive behavior: Secondary | ICD-10-CM

## 2011-06-27 DIAGNOSIS — H5410 Blindness, one eye, low vision other eye, unspecified eyes: Secondary | ICD-10-CM | POA: Insufficient documentation

## 2011-06-27 DIAGNOSIS — F429 Obsessive-compulsive disorder, unspecified: Secondary | ICD-10-CM

## 2011-06-27 DIAGNOSIS — Z23 Encounter for immunization: Secondary | ICD-10-CM

## 2011-06-27 DIAGNOSIS — I10 Essential (primary) hypertension: Secondary | ICD-10-CM

## 2011-06-27 HISTORY — DX: Acquired absence of eye: Z90.01

## 2011-06-27 HISTORY — DX: Blindness, one eye, unspecified eye: H54.40

## 2011-06-27 HISTORY — DX: Blindness, one eye, low vision other eye, unspecified eyes: H54.10

## 2011-06-27 LAB — LIPID PANEL
Cholesterol: 248 mg/dL — ABNORMAL HIGH (ref 0–200)
Total CHOL/HDL Ratio: 3.7 Ratio
Triglycerides: 89 mg/dL (ref <150)
VLDL: 18 mg/dL (ref 0–40)

## 2011-06-27 MED ORDER — SIMVASTATIN 40 MG PO TABS: 40.0000 mg | ORAL_TABLET | Freq: Every day | ORAL | Status: DC

## 2011-06-27 MED ORDER — NITROGLYCERIN 0.4 MG SL SUBL: 0.4000 mg | SUBLINGUAL_TABLET | SUBLINGUAL | Status: DC | PRN

## 2011-06-27 NOTE — Progress Notes (Deleted)
  Subjective:    Patient ID: [name], female    DOB: 04-05-1936, 75 y.o.   MRN: [id number]  HPI .my   Review of Systems     Objective:   Physical Exam        Assessment & Plan:

## 2011-06-27 NOTE — Assessment & Plan Note (Addendum)
Patient's blood pressure is very well controlled. Did not check electrolytes at this visit. Please check at next visit in 6 months. It is possible she may be able to stop one medication in the future. She is perfectly at goal today so did not make any changes to regimen.

## 2011-06-27 NOTE — Patient Instructions (Signed)
If you get a call saying that your cholesterol levels are elevated please fill prescription for Zocor, which is a cholesterol medicine in place of the Vytorin. If your cholesterol is normal, you may not need any cholesterol medication. Exercise with your daughter!

## 2011-06-27 NOTE — Assessment & Plan Note (Signed)
Patient reports she will go to have her prosthesis cleaned and polished in [place]. She denies any pain in her left eye and reports taking her glaucoma drops regularly. She follows up with her outside ophthalmologist.

## 2011-06-27 NOTE — Assessment & Plan Note (Signed)
Did not address this issue at this visit aside from instructing patient to treat her dry skin symptomatically with vaseline and light gloves at night. At next visit should discuss this compulsive behavior with her and see if she desires treatment. Could try to substitute non-drying foam hand sanitizer for soap.

## 2011-06-27 NOTE — Assessment & Plan Note (Addendum)
Patient has been off statin for approximately 3 months. Will check her cholesterol today and determine if she needs to start Zocor. I prescribed it to her E-pharmacy today and told her to start it if she gets a call for an elevated cholesterol level. Patient denies any chest pain in the last few years but says that she likes to have nitroglycerin in the house as a reassurance in case her chest pain recurs. Prescribed it for her today.

## 2011-06-27 NOTE — Progress Notes (Signed)
Subjective:   Patient ID: [name] female   DOB: 16-Sep-1935 75 y.o.   MRN: [id number]  HPI: [name] is a 75 y.o. lady who is legally blind with extensive vision loss from glaucoma and HTN that is well controlled. She has been treated   Patient lost quite a bit of weight on [employer] for life diet with her daughter starting in summer 2011. Patient isn't exercising anymore, was using treadmill. She wants to start again.  Patient hasn't had cholesterol medications since around last visit in August. When she called pharmacy she was no longer prescribed Vytorin. Did not ever start Zocor that was prescribed at last visit. Will get fasting lipids and assess if patient still needs medication since she had such a significant weight loss.   Past Medical History  Diagnosis Date  . Hypertension   . HLD (hyperlipidemia)   . CAD (coronary artery disease)     nonobstructive, myoview normal 03/2007, done by Dr. Daleen Squibb - EF = 75%  . Glaucoma    Current Outpatient Prescriptions  Medication Sig Dispense Refill  . amLODipine (NORVASC) 5 MG tablet Take 5 mg by mouth daily.        [name] atenolol (TENORMIN) 25 MG tablet Take 1 tablet (25 mg total) by mouth daily.  90 tablet  11  . bimatoprost (LUMIGAN) 0.03 % ophthalmic drops Place 1 drop into the left eye at bedtime.        . brimonidine (ALPHAGAN P) 0.1 % SOLN 1 drop in left eye twice a day      . dorzolamide-timolol (COSOPT) 22.3-6.8 MG/ML ophthalmic solution 1 drop 2 (two) times daily.        . hydrochlorothiazide 25 MG tablet Take 25 mg by mouth daily.        . nitroGLYCERIN (NITROQUICK) 0.4 MG SL tablet Place 1 tablet (0.4 mg total) under the tongue every 5 (five) minutes as needed.  90 tablet  3  . simvastatin (ZOCOR) 40 MG tablet Take 1 tablet (40 mg total) by mouth at bedtime.  31 tablet  11  . DISCONTD: simvastatin (ZOCOR) 40 MG tablet Take 1 tablet (40 mg total) by mouth at bedtime.  31 tablet  11   Family History  Problem Relation  Age of Onset  . Stroke Neg Hx   . Cancer Neg Hx    History   Social History  . Marital Status: Widowed    Spouse Name: N/A    Number of Children: N/A  . Years of Education: N/A   Social History Main Topics  . Smoking status: Former Smoker    Types: Cigarettes  . Smokeless tobacco: None  . Alcohol Use: No  . Drug Use: No  . Sexually Active: None   Other Topics Concern  . None   Social History Narrative   Has a son and a daughter, granddaughter spends a lot of time at her house. Sister and she live together for many years. Sister was living with her mother and [name] was married but the mother passed away and her husband passed away so they moved in together.   Review of Systems: HEENT: Denies photophobia, eye pain, redness  Respiratory: Denies SOB, cough   Cardiovascular: Denies chest pain and leg swelling.  Gastrointestinal: Denies diarrhea, constipation, blood in stool. Genitourinary: Reports 3 times nightly nocturia.  Psychiatric: Reports frequent hand washing and skin cracking.  Objective:  Physical Exam: Filed Vitals:   06/27/11 0849  BP: 133/71  Pulse: 69  Temp: 96.8 F (36 C)  TempSrc: Oral  Height: 5\' 5"  (1.651 m)  Weight: 136 lb (61.689 kg)   Constitutional: Vital signs reviewed.  Patient is a well-developed and well-nourished woman in no acute distress and cooperative with exam. Alert and oriented x3.  Mouth: no erythema or exudates, MMM Eyes: PERRL, EOMI, conjunctivae normal, No scleral icterus.  Cardiovascular: RRR, S1 normal, S2 normal, no MRG, pulses symmetric and intact bilaterally Pulmonary/Chest: CTAB, no wheezes, rales, or rhonchi Abdominal: Soft. Non-tender, non-distended, bowel sounds are normal, no masses, organomegaly, or guarding present.  Psychiatric: Normal mood and affect. speech and behavior is normal. Judgment and thought content normal. Cognition and memory are normal.   Assessment & Plan:

## 2011-10-07 ENCOUNTER — Other Ambulatory Visit: Payer: Self-pay | Admitting: *Deleted

## 2011-10-07 DIAGNOSIS — I10 Essential (primary) hypertension: Secondary | ICD-10-CM

## 2011-10-07 NOTE — Telephone Encounter (Signed)
Pt wants to come off simvastatin because of muscle weakness.  She wants to try Vytorin again, this will require a PA.  Please advise

## 2011-10-08 ENCOUNTER — Other Ambulatory Visit: Payer: Self-pay | Admitting: Ophthalmology

## 2011-10-08 MED ORDER — PRAVASTATIN SODIUM 40 MG PO TABS: 40.0000 mg | ORAL_TABLET | Freq: Every day | ORAL | Status: DC

## 2011-10-08 MED ORDER — HYDROCHLOROTHIAZIDE 25 MG PO TABS: 25.0000 mg | ORAL_TABLET | Freq: Every day | ORAL | Status: DC

## 2011-10-08 NOTE — Progress Notes (Signed)
Will prescribe pavastatin since complaining of muscle aches with simvastatin. Will not prescribe vytorin since would be more likely to cause muscle problems. Will not need prior authorization.

## 2011-10-08 NOTE — Telephone Encounter (Signed)
If you could call for the prior authorization I am happy to write her for the scripts.

## 2011-10-08 NOTE — Telephone Encounter (Signed)
Vytorin is the same medicine (simvastatin) as Zocor. It is actually more likely to cause muscle weakness since it is combined with a second agent. I would like her to try pravastatin which is less likely to cause muscle problems.

## 2012-01-09 ENCOUNTER — Encounter: Payer: Self-pay | Admitting: Ophthalmology

## 2012-01-09 ENCOUNTER — Ambulatory Visit (INDEPENDENT_AMBULATORY_CARE_PROVIDER_SITE_OTHER): Payer: Medicare Other | Admitting: Ophthalmology

## 2012-01-09 VITALS — BP 128/68 | HR 68 | Temp 97.8°F | Ht 65.0 in | Wt 146.4 lb

## 2012-01-09 DIAGNOSIS — E785 Hyperlipidemia, unspecified: Secondary | ICD-10-CM | POA: Diagnosis not present

## 2012-01-09 DIAGNOSIS — I10 Essential (primary) hypertension: Secondary | ICD-10-CM | POA: Diagnosis not present

## 2012-01-09 DIAGNOSIS — F429 Obsessive-compulsive disorder, unspecified: Secondary | ICD-10-CM

## 2012-01-09 DIAGNOSIS — R4681 Obsessive-compulsive behavior: Secondary | ICD-10-CM

## 2012-01-09 LAB — CBC
HCT: 38.8 % (ref 36.0–46.0)
Hemoglobin: 13.2 g/dL (ref 12.0–15.0)
MCH: 29.9 pg (ref 26.0–34.0)
MCHC: 34 g/dL (ref 30.0–36.0)

## 2012-01-09 LAB — BASIC METABOLIC PANEL
CO2: 31 mEq/L (ref 19–32)
Chloride: 102 mEq/L (ref 96–112)
Sodium: 142 mEq/L (ref 135–145)

## 2012-01-09 LAB — LIPID PANEL
LDL Cholesterol: 133 mg/dL — ABNORMAL HIGH (ref 0–99)
Triglycerides: 76 mg/dL (ref <150)

## 2012-01-09 MED ORDER — FLUOXETINE HCL (PMDD) 20 MG PO CAPS: 20.0000 mg | ORAL_CAPSULE | Freq: Once | ORAL | Status: DC

## 2012-01-09 MED ORDER — AMLODIPINE BESYLATE 5 MG PO TABS: 5.0000 mg | ORAL_TABLET | Freq: Every day | ORAL | Status: DC

## 2012-01-09 NOTE — Assessment & Plan Note (Signed)
Started patient on prozac 20mg  and will plan to increase dose to 40mg  if well tolerated. Informed of common side effects and asked her to call us if any problems.

## 2012-01-09 NOTE — Assessment & Plan Note (Signed)
No lipids checked while on statin. Checked today to see if she is within range.

## 2012-01-09 NOTE — Progress Notes (Signed)
Subjective:   Patient ID: [name] female   DOB: 18-Jun-1936 76 y.o.   MRN: [id number]  HPI: [name] is a 76 y.o. woman who is here for routine follow up.   OCD- dryness of hands is much improved, she has been using vaseline and gloves at nighttime and has been using rubber gloves while washing dishes.  Patient feels that she is depressed since she has lost her vision in 1998.   HTN- well controlled on amlodipine and HCTZ  HLD- started pravachol due to side effects.   Stopped walking on the treadmill and has been snacking a lot. Was moving furniture and put treadmill. Has gained 10 lbs since last visit.   Past Medical History  Diagnosis Date  . Hypertension   . HLD (hyperlipidemia)   . CAD (coronary artery disease)     nonobstructive, myoview normal 03/2007, done by Dr. Daleen Squibb - EF = 75%  . Glaucoma    Current Outpatient Prescriptions  Medication Sig Dispense Refill  . amLODipine (NORVASC) 5 MG tablet Take 5 mg by mouth daily.        [name] atenolol (TENORMIN) 25 MG tablet Take 1 tablet (25 mg total) by mouth daily.  90 tablet  11  . bimatoprost (LUMIGAN) 0.03 % ophthalmic drops Place 1 drop into the left eye at bedtime.        . brimonidine (ALPHAGAN P) 0.1 % SOLN 1 drop in left eye twice a day      . dorzolamide-timolol (COSOPT) 22.3-6.8 MG/ML ophthalmic solution 1 drop 2 (two) times daily.        . hydrochlorothiazide (HYDRODIURIL) 25 MG tablet Take 1 tablet (25 mg total) by mouth daily.  60 tablet  4  . nitroGLYCERIN (NITROQUICK) 0.4 MG SL tablet Place 1 tablet (0.4 mg total) under the tongue every 5 (five) minutes as needed.  90 tablet  3  . pravastatin (PRAVACHOL) 40 MG tablet Take 1 tablet (40 mg total) by mouth daily.  60 tablet  4   Family History  Problem Relation Age of Onset  . Stroke Neg Hx   . Cancer Neg Hx    History   Social History  . Marital Status: Widowed    Spouse Name: N/A    Number of Children: N/A  . Years of Education: N/A   Social  History Main Topics  . Smoking status: Former Smoker    Types: Cigarettes  . Smokeless tobacco: None  . Alcohol Use: No  . Drug Use: No  . Sexually Active: None   Other Topics Concern  . None   Social History Narrative   Has a son and a daughter, granddaughter spends a lot of time at her house. Sister and she live together for many years. Sister was living with her mother and Ms. Bromwell was married but the mother passed away and her husband passed away so they moved in together.   Objective:  Physical Exam: Filed Vitals:   01/09/12 1120  BP: 128/68  Pulse: 68  Temp: 97.8 F (36.6 C)  TempSrc: Oral  Height: 5\' 5"  (1.651 m)  Weight: 146 lb 6.4 oz (66.407 kg)  SpO2: 96%   General: pleasant elderly blind woman sitting in chair with sunglasses and cane Cardiac: RRR, no rubs, murmurs or gallops Pulm: clear to auscultation bilaterally, moving normal volumes of air Ext: warm and well perfused, no pedal edema Neuro: alert and oriented X3, cranial nerves II-XII grossly intact  Assessment & Plan:

## 2012-01-09 NOTE — Patient Instructions (Signed)
-  Please take fluoxetine (prozac ) once daily and look out for side effects that are bothering you -Return in one month so we can increase the dose

## 2012-01-09 NOTE — Assessment & Plan Note (Signed)
Very well controlled currently. No changes made, refilled amlodipine.

## 2012-05-08 ENCOUNTER — Encounter: Payer: Self-pay | Admitting: Internal Medicine

## 2012-05-08 ENCOUNTER — Ambulatory Visit (INDEPENDENT_AMBULATORY_CARE_PROVIDER_SITE_OTHER): Payer: Medicare Other | Admitting: Internal Medicine

## 2012-05-08 VITALS — BP 126/70 | HR 58 | Temp 96.6°F | Ht 65.5 in | Wt 145.5 lb

## 2012-05-08 DIAGNOSIS — I10 Essential (primary) hypertension: Secondary | ICD-10-CM

## 2012-05-08 DIAGNOSIS — Z23 Encounter for immunization: Secondary | ICD-10-CM | POA: Diagnosis not present

## 2012-05-08 NOTE — Progress Notes (Signed)
Patient ID: [name], female   DOB: Dec 29, 1935, 76 y.o.   MRN: [id number]  Subjective:   Patient ID: [name] female   DOB: 06/19/1936 76 y.o.   MRN: [id number]  HPI: [name] is a 76 y.o. female w/ PMH HTN, HLD, and OCD who presents to the clinic for a blood pressure check.   Yesterday she was feeling lightheaded and dizzy and became anxious, so her daughter took her to [facility] to have her BP checked- it was 167/78. [name] was concerned and scheduled an appt to be seen in clinic. She endorses compliance with all of her medications, and states that overall she is doing really well. Her dizziness has resolved.    Past Medical History  Diagnosis Date  . Hypertension   . HLD (hyperlipidemia)   . CAD (coronary artery disease)     nonobstructive, myoview normal 03/2007, done by Dr. Daleen Squibb - EF = 75%  . Glaucoma    Current Outpatient Prescriptions  Medication Sig Dispense Refill  . amLODipine (NORVASC) 5 MG tablet Take 1 tablet (5 mg total) by mouth daily.  90 tablet  6  . atenolol (TENORMIN) 25 MG tablet Take 1 tablet (25 mg total) by mouth daily.  90 tablet  11  . bimatoprost (LUMIGAN) 0.03 % ophthalmic drops Place 1 drop into the left eye at bedtime.        . brimonidine (ALPHAGAN P) 0.1 % SOLN 1 drop in left eye twice a day      . dorzolamide-timolol (COSOPT) 22.3-6.8 MG/ML ophthalmic solution 1 drop 2 (two) times daily.        . hydrochlorothiazide (HYDRODIURIL) 25 MG tablet Take 1 tablet (25 mg total) by mouth daily.  60 tablet  4  . pravastatin (PRAVACHOL) 40 MG tablet Take 1 tablet (40 mg total) by mouth daily.  60 tablet  4  . Fluoxetine HCl, PMDD, 20 MG CAPS Take 1 capsule (20 mg total) by mouth once.  30 each  2  . nitroGLYCERIN (NITROQUICK) 0.4 MG SL tablet Place 1 tablet (0.4 mg total) under the tongue every 5 (five) minutes as needed.  90 tablet  3   Family History  Problem Relation Age of Onset  . Stroke Neg Hx   . Cancer Neg Hx    History    Social History  . Marital Status: Widowed    Spouse Name: N/A    Number of Children: N/A  . Years of Education: N/A   Social History Main Topics  . Smoking status: Former Smoker    Types: Cigarettes  . Smokeless tobacco: None  . Alcohol Use: No  . Drug Use: No  . Sexually Active: None   Other Topics Concern  . None   Social History Narrative   Has a son and a daughter, granddaughter spends a lot of time at her house. Sister and she live together for many years. Sister was living with her mother and [name] was married but the mother passed away and her husband passed away so they moved in together.   Review of Systems: Constitutional: Denies fever, chills, diaphoresis, appetite change and fatigue.  HEENT: Denies photophobia, eye pain, redness, hearing loss, ear pain, congestion, sore throat, rhinorrhea, sneezing, mouth sores, trouble swallowing, neck pain, neck stiffness and tinnitus.   Respiratory: Denies SOB, DOE, cough, chest tightness,  and wheezing.   Cardiovascular: Denies chest pain, palpitations and leg swelling.  Gastrointestinal: Denies nausea, vomiting, abdominal pain, diarrhea, constipation, blood in  stool and abdominal distention.  Genitourinary: Denies dysuria, urgency, frequency, hematuria, flank pain and difficulty urinating.  Musculoskeletal: Denies myalgias, back pain, joint swelling, arthralgias and gait problem.  Skin: Denies pallor, rash and wound.  Neurological: Endorses dizziness and light-headedness. Denies seizures, syncope, weakness, numbness and headaches.  Hematological: Denies adenopathy. Easy bruising, personal or family bleeding history  Psychiatric/Behavioral: Endorses mild anxiety w/ dizziness and lightheadedness. Denies suicidal ideation, mood changes, confusion, nervousness, sleep disturbance and agitation  Objective:  Physical Exam: Filed Vitals:   05/08/12 1319  BP: 125/70  Pulse: 58  Temp: 96.6 F (35.9 C)  TempSrc: Oral  Height:  5' 5.5" (1.664 m)  Weight: 145 lb 8 oz (65.998 kg)  SpO2: 96%   Constitutional: Vital signs reviewed.  Patient is a well-developed and well-nourished female in no acute distress and cooperative with exam. Alert and oriented x3.  Head: Normocephalic and atraumatic Mouth: No erythema or exudates, MMM Eyes: PERRL, EOMI, conjunctivae normal, No scleral icterus.  Neck: Supple, Trachea midline normal ROM, No JVD, mass, or thyromegaly. Cardiovascular: RRR, no MRG, pulses symmetric and intact bilaterally Pulmonary/Chest: CTAB, no wheezes, rales, or rhonchi Abdominal: Soft. Non-tender, non-distended, bowel sounds are normal, no masses, organomegaly, or guarding present.  GU: No CVA tenderness Musculoskeletal: No joint deformities, erythema, or stiffness, ROM full and no nontender Hematology: No appreciable adenopathy.  Neurological: A&O x3, Strength is normal and symmetric bilaterally, cranial nerve II-XII are grossly intact, no focal motor deficit, sensory intact to light touch bilaterally.  Skin: Warm, dry and intact. No rash, cyanosis, or clubbing.  Psychiatric: Normal mood and affect. speech and behavior is normal. Judgment and thought content normal. Cognition and memory are normal.   Assessment & Plan:   Please see Problem List based A&P

## 2012-05-08 NOTE — Patient Instructions (Signed)
Continue to take all of your medications as prescribed.  Follow up in 3 months or sooner if needed.

## 2012-05-08 NOTE — Progress Notes (Signed)
INTERNAL MEDICINE TEACHING ATTENDING ADDENDUM - Inez Catalina, MD: I personally saw and evaluated Ms. Hanigan in this clinic visit in conjunction with the resident, Dr. Sherrine Maples. I have discussed patient's plan of care with medical resident during this visit. I have confirmed the physical exam findings and have read and agree with the clinic note including the plan

## 2012-05-08 NOTE — Assessment & Plan Note (Addendum)
BP in the clinic is 128/68, which is normal for the pt.  Orthostatic BPs w/o change from sitting BP.   Pt to continue regular medications.

## 2012-05-19 ENCOUNTER — Other Ambulatory Visit: Payer: Self-pay | Admitting: Ophthalmology

## 2012-11-03 DIAGNOSIS — H4010X0 Unspecified open-angle glaucoma, stage unspecified: Secondary | ICD-10-CM | POA: Diagnosis not present

## 2012-11-03 DIAGNOSIS — H409 Unspecified glaucoma: Secondary | ICD-10-CM | POA: Diagnosis not present

## 2012-12-21 ENCOUNTER — Other Ambulatory Visit: Payer: Self-pay | Admitting: *Deleted

## 2012-12-21 MED ORDER — ATENOLOL 25 MG PO TABS: 25.0000 mg | ORAL_TABLET | Freq: Every day | ORAL | Status: DC

## 2013-01-03 ENCOUNTER — Other Ambulatory Visit: Payer: Self-pay | Admitting: Ophthalmology

## 2013-01-19 ENCOUNTER — Encounter: Payer: Medicare Other | Admitting: Internal Medicine

## 2013-01-25 ENCOUNTER — Encounter: Payer: Self-pay | Admitting: Internal Medicine

## 2013-01-25 ENCOUNTER — Ambulatory Visit (INDEPENDENT_AMBULATORY_CARE_PROVIDER_SITE_OTHER): Payer: Medicare Other | Admitting: Internal Medicine

## 2013-01-25 VITALS — BP 128/75 | HR 54 | Temp 97.0°F | Ht 62.75 in | Wt 151.4 lb

## 2013-01-25 DIAGNOSIS — R42 Dizziness and giddiness: Secondary | ICD-10-CM | POA: Diagnosis not present

## 2013-01-25 DIAGNOSIS — I10 Essential (primary) hypertension: Secondary | ICD-10-CM

## 2013-01-25 DIAGNOSIS — E785 Hyperlipidemia, unspecified: Secondary | ICD-10-CM

## 2013-01-25 LAB — LIPID PANEL
Cholesterol: 252 mg/dL — ABNORMAL HIGH (ref 0–200)
HDL: 67 mg/dL (ref >39)
Total CHOL/HDL Ratio: 3.8 Ratio

## 2013-01-25 LAB — CBC
MCH: 29.8 pg (ref 26.0–34.0)
MCV: 86.1 fL (ref 78.0–100.0)
Platelets: 255 10*3/uL (ref 150–400)
RBC: 4.6 MIL/uL (ref 3.87–5.11)

## 2013-01-25 LAB — COMPLETE METABOLIC PANEL WITH GFR
Albumin: 4 g/dL (ref 3.5–5.2)
Alkaline Phosphatase: 108 U/L (ref 39–117)
BUN: 15 mg/dL (ref 6–23)
GFR, Est Non African American: 59 mL/min — ABNORMAL LOW
Glucose, Bld: 93 mg/dL (ref 70–99)
Total Bilirubin: 0.4 mg/dL (ref 0.3–1.2)

## 2013-01-25 NOTE — Progress Notes (Addendum)
Subjective:     Patient ID: [name], female   DOB: August 10, 1936, 77 y.o.   MRN: [id number]  HPI [name], is a 77 yr old lady who I am seeing for the first time. She is blind in right eye due to glaucoma, has hypertension and has not been feeling right since the past 3 weeks. Has a lot of things going on in her life right now. She says she has been having difficult times (daughter lost job, diagnosed with bipolar, living with her at this moment, more bills to pay) but things are settling down. Patient has high blood pressure this visit. Has dizziness since the past 3 weeks when she has not felt right. She says this happens when her blood pressure goes up. She has not measured her blood pressure in the past 3 weeks. Denies chest pain, palpitations, shortness of breath. She has interrupted sleep because she goes to the bathroom 4-5 times at night. She has been having headaches, dull, lasting for hours every day since the past 3 weeks. Has had normal appetite like usual. She takes about 4 glasses of water every day. She does not use air conditioning at home. She has been working a lot at home recently, household chores. But she has not had any recent falls. She walks with a cane and eye shades on.   Review of Systems  Constitutional: Negative.   Eyes: Positive for visual disturbance (Glaucaoma, chronic blindness one eye).  Respiratory: Negative for apnea, cough, choking, chest tightness, shortness of breath, wheezing and stridor.   Cardiovascular: Negative for chest pain, palpitations and leg swelling.  Gastrointestinal: Negative.   Genitourinary: Negative.   Neurological: Positive for dizziness, light-headedness and headaches. Negative for seizures, syncope, speech difficulty, weakness and numbness.  Hematological: Negative.   Psychiatric/Behavioral: Negative.       Objective:   Physical Exam Sitting in chair comfortable. No acute distress Neck- no pain, ROM not limited. Eyes- conjugate  movements Heart- S1S2 RRR, no murmurs Lungs - normal breath sounds. No pedal edema.     Assessment & Plan:     Hypertension with dizziness - Repeat blood pressure - 144/76 lying; Sitting 145/77; Standing 128/75, felt a little lightheaded when she stood up. No tachycardia. Orthostatics - positive for symptoms.   Patient has mostly had a normal blood pressure in clinic before. I think she is dizzy because she might not have been drinking well, and working in heat without air conditioning. I have instructed her to drink plenty of water. I will not increase or decrease any blood pressure medications today. My other differential is anemia, electrolyte abnormalities. We have done CMP, CBC, and lipid profile today.  I would like to see her back in 1 month and follow up on dizziness.  Lab results (as of 01/26/13 at 7:16 am):     The blood work was essentially normal except for increased LDL and cholesterol. The patient is on Pravastatin 40 mg daily and we could go up on the dosage. I will talk to the patient about this in the next visit.   Results for [name] (MRN [id number]) as of 01/26/2013 07:16  10/12/2010 09:58 06/27/2011 10:29 01/09/2012 12:33 01/25/2013 11:15  Cholesterol 163 248 (H) 216 (H) 252 (H)  Triglycerides 81 89 76 82  HDL 56 67 68 67  LDL (calc) 91 163 (H) 133 (H) 169 (H)  VLDL 16 18 15 16   Total CHOL/HDL Ratio 2.9 3.7 3.2 3.8

## 2013-01-25 NOTE — Patient Instructions (Addendum)
[name],   Your blood pressure is currently stable and normal. It is possible that you are dizzy because you do not drink enough water. Please try drinking 8-12 glasses of water a day and see if you feel better regarding dizziness.  I hope the stressful situation in your family solves out and you are able to relax.  We have done some blood work today and I will call you if there are any abnormal results.   Thanks, Diamond Edouard MD MPH 01/25/2013 11:05 AM      Dehydration Dehydration means your body does not have as much fluid as it needs. Your kidneys, brain, and heart will not work properly without the right amount of fluids and salt.  HOME CARE  Ask your doctor how to replace body fluid losses (rehydrate).  Drink enough fluids to keep your pee (urine) clear or pale yellow.  Drink small amounts of fluids often if you feel sick to your stomach (nauseous) or throw up (vomit).  Eat like you normally do.  Avoid:  Foods or drinks high in sugar.  Bubbly (carbonated) drinks.  Juice.  Very hot or cold fluids.  Drinks with caffeine.  Fatty, greasy foods.  Alcohol.  Tobacco.  Eating too much.  Gelatin desserts.  Wash your hands to avoid spreading germs (bacteria, viruses).  Only take medicine as told by your doctor.  Keep all doctor visits as told. GET HELP RIGHT AWAY IF:   You cannot drink something without throwing up.  You get worse even with treatment.  Your vomit has blood in it or looks greenish.  Your poop (stool) has blood in it or looks black and tarry.  You have not peed in 6 to 8 hours.  You pee a small amount of very dark pee.  You have a fever.  You pass out (faint).  You have belly (abdominal) pain that gets worse or stays in one spot (localizes).  You have a rash, stiff neck, or bad headache.  You get easily annoyed, sleepy, or are hard to wake up.  You feel weak, dizzy, or very thirsty. MAKE SURE YOU:   Understand these  instructions.  Will watch your condition.  Will get help right away if you are not doing well or get worse.    Document Released: 06/08/2009 Document Revised: 11/04/2011 Document Reviewed: 04/01/2011 [facility] Patient Information 2014 [place], [place].

## 2013-03-26 ENCOUNTER — Other Ambulatory Visit: Payer: Self-pay | Admitting: Internal Medicine

## 2013-05-11 DIAGNOSIS — Z961 Presence of intraocular lens: Secondary | ICD-10-CM | POA: Diagnosis not present

## 2013-05-11 DIAGNOSIS — H409 Unspecified glaucoma: Secondary | ICD-10-CM | POA: Diagnosis not present

## 2013-05-11 DIAGNOSIS — H4010X0 Unspecified open-angle glaucoma, stage unspecified: Secondary | ICD-10-CM | POA: Diagnosis not present

## 2013-08-05 ENCOUNTER — Other Ambulatory Visit: Payer: Self-pay | Admitting: *Deleted

## 2013-08-05 MED ORDER — ATENOLOL 25 MG PO TABS: 25.0000 mg | ORAL_TABLET | Freq: Every day | ORAL | Status: DC

## 2013-09-20 ENCOUNTER — Other Ambulatory Visit: Payer: Self-pay | Admitting: Internal Medicine

## 2013-09-20 DIAGNOSIS — I10 Essential (primary) hypertension: Secondary | ICD-10-CM

## 2013-10-15 ENCOUNTER — Other Ambulatory Visit: Payer: Self-pay | Admitting: Internal Medicine

## 2013-10-15 NOTE — Telephone Encounter (Signed)
Please schedule patient for an appointment. Last appt > 6 months back.

## 2013-11-16 DIAGNOSIS — H409 Unspecified glaucoma: Secondary | ICD-10-CM | POA: Diagnosis not present

## 2013-11-16 DIAGNOSIS — H18419 Arcus senilis, unspecified eye: Secondary | ICD-10-CM | POA: Diagnosis not present

## 2013-11-16 DIAGNOSIS — H4010X0 Unspecified open-angle glaucoma, stage unspecified: Secondary | ICD-10-CM | POA: Diagnosis not present

## 2013-11-16 DIAGNOSIS — Z961 Presence of intraocular lens: Secondary | ICD-10-CM | POA: Diagnosis not present

## 2013-12-26 ENCOUNTER — Other Ambulatory Visit: Payer: Self-pay | Admitting: Internal Medicine

## 2013-12-27 NOTE — Telephone Encounter (Signed)
Last refill was done on the condition that the patient needs to be seen. She has not been seen in over 10 months. Please schedule an appointment.

## 2014-01-17 ENCOUNTER — Telehealth: Payer: Self-pay | Admitting: Internal Medicine

## 2014-01-17 MED ORDER — HYDROCHLOROTHIAZIDE 25 MG PO TABS: 25.0000 mg | ORAL_TABLET | Freq: Every day | ORAL | Status: DC

## 2014-01-17 NOTE — Telephone Encounter (Signed)
   Reason for call:   I received a call from Ms. Daneil Dolin at 2  PM indicating that she had run out of her HCTZ and usually gets her prescription through the mail but can't wait that long and therefore as a backup she uses a [facility] pharmacy but they told her they needed a renewed prescription. She denied any chest pain, sob, diaphoresis, orthostatics, or doe.    Pertinent Data:   Review of medications   Assessment / Plan / Recommendations:   Refill was provided as well as notification to her PCP, all questions answered  As always, pt is advised that if symptoms worsen or new symptoms arise, they should go to an urgent care facility or to to ER for further evaluation.   Christen Bame, MD   01/17/2014, 2:10 PM

## 2014-01-21 NOTE — Telephone Encounter (Signed)
Thank you Dr Burtis Junes!

## 2014-04-26 ENCOUNTER — Other Ambulatory Visit: Payer: Self-pay | Admitting: *Deleted

## 2014-04-26 MED ORDER — ATENOLOL 25 MG PO TABS: 25.0000 mg | ORAL_TABLET | Freq: Every day | ORAL | Status: DC

## 2014-05-03 ENCOUNTER — Encounter: Payer: Self-pay | Admitting: Internal Medicine

## 2014-05-03 ENCOUNTER — Ambulatory Visit (INDEPENDENT_AMBULATORY_CARE_PROVIDER_SITE_OTHER): Payer: Medicare Other | Admitting: Internal Medicine

## 2014-05-03 VITALS — BP 136/64 | HR 81 | Temp 97.7°F | Ht 62.75 in | Wt 149.2 lb

## 2014-05-03 DIAGNOSIS — R3589 Other polyuria: Secondary | ICD-10-CM

## 2014-05-03 DIAGNOSIS — Z23 Encounter for immunization: Secondary | ICD-10-CM

## 2014-05-03 DIAGNOSIS — E785 Hyperlipidemia, unspecified: Secondary | ICD-10-CM | POA: Diagnosis not present

## 2014-05-03 DIAGNOSIS — R6889 Other general symptoms and signs: Secondary | ICD-10-CM | POA: Diagnosis not present

## 2014-05-03 DIAGNOSIS — H5450 Low vision, one eye, unspecified eye: Secondary | ICD-10-CM

## 2014-05-03 DIAGNOSIS — H547 Unspecified visual loss: Secondary | ICD-10-CM

## 2014-05-03 DIAGNOSIS — I10 Essential (primary) hypertension: Secondary | ICD-10-CM | POA: Diagnosis not present

## 2014-05-03 DIAGNOSIS — H5440 Blindness, one eye, unspecified eye: Secondary | ICD-10-CM

## 2014-05-03 DIAGNOSIS — R358 Other polyuria: Secondary | ICD-10-CM

## 2014-05-03 LAB — LIPID PANEL
CHOLESTEROL: 203 mg/dL — AB (ref 0–200)
HDL: 71 mg/dL (ref >39)
LDL Cholesterol: 112 mg/dL — ABNORMAL HIGH (ref 0–99)
TRIGLYCERIDES: 101 mg/dL (ref <150)
Total CHOL/HDL Ratio: 2.9 Ratio
VLDL: 20 mg/dL (ref 0–40)

## 2014-05-03 LAB — POCT GLYCOSYLATED HEMOGLOBIN (HGB A1C): Hemoglobin A1C: 5.6

## 2014-05-03 LAB — GLUCOSE, CAPILLARY: Glucose-Capillary: 103 mg/dL — ABNORMAL HIGH (ref 70–99)

## 2014-05-03 MED ORDER — ATORVASTATIN CALCIUM 20 MG PO TABS: 20.0000 mg | ORAL_TABLET | Freq: Every day | ORAL | Status: DC

## 2014-05-03 NOTE — Assessment & Plan Note (Signed)
Changed Pravastatin 40 to Atorvastatin 20 daily based on last lipid panel. New lipid panel this year.  Lipid Panel last year     Component Value Date/Time   CHOL 252* 01/25/2013 1115   TRIG 82 01/25/2013 1115   HDL 67 01/25/2013 1115   CHOLHDL 3.8 01/25/2013 1115   VLDL 16 01/25/2013 1115   LDLCALC 169* 01/25/2013 1115

## 2014-05-03 NOTE — Progress Notes (Signed)
Subjective:     Patient ID: [name], female   DOB: 1936-05-24, 78 y.o.   MRN: [id number]  HPI Ms Tullius presents to the clinic after 1 year. She follows for hypertension, hyperlipidemia. She reports no intercurrent illnesses. She was undergoing some stress at home at the time of her last visit which is taken care of now, and she is more cheerful. She is doing well otherwise, has a new complaint of forgetfulness - occassionally. She mentions that she forgets where she placed things sometimes, and forgets appointments. She also complains of feeling like eating all the time, and urinating frequently. However she does not have nocturnal awakenings for urination, she has also not gained any weight. She otherwise complains of no chest pain, palpitations, falls, shortness of breath.   Review of Systems  Constitutional: Negative for chills, activity change, fatigue and unexpected weight change.  Eyes: Positive for visual disturbance (Right eye blindness, left eye limited vision).  Respiratory: Negative for cough, chest tightness, shortness of breath and wheezing.   Cardiovascular: Negative for chest pain, palpitations and leg swelling.  Gastrointestinal: Negative for nausea and vomiting.  Endocrine: Positive for polyphagia and polyuria. Negative for polydipsia.  Genitourinary: Negative for dysuria, urgency, decreased urine volume and difficulty urinating.  Musculoskeletal: Negative.   Neurological: Negative.        Forgetfulness       Objective:   Physical Exam  Constitutional: She is oriented to person, place, and time. She appears well-developed and well-nourished. No distress.  HENT:  Head: Normocephalic and atraumatic.  Mouth/Throat: Oropharynx is clear and moist. No oropharyngeal exudate.  Eyes:  Right: Implant Left: Iridotomy  Cardiovascular: Normal rate, regular rhythm and normal heart sounds.  Exam reveals no friction rub.   No murmur heard. Pulmonary/Chest: Effort normal and  breath sounds normal. No respiratory distress. She has no wheezes. She has no rales. She exhibits no tenderness.  Abdominal: Soft. Bowel sounds are normal.  Musculoskeletal: She exhibits no edema and no tenderness.  Neurological: She is alert and oriented to person, place, and time. She displays normal reflexes. No cranial nerve deficit. She exhibits normal muscle tone. Coordination normal.  Skin: Skin is warm. She is not diaphoretic.  Psychiatric: She has a normal mood and affect. Her speech is normal and behavior is normal. Judgment and thought content normal. Cognition and memory are normal.      Assessment & Plan:      Hypertension - Well controlled. Continue same medications.   Hyperlipidemia - changed Pravastatin 40 to Atorvastatin 20 daily based on last lipid panel. Patient agreeable to the change this year. New lipid panel this year.   Forgetfulness - Mini Cog done - 1 recall + CDT normal - no cognitive deficit screened.   Polyuria, polyphagia - A1c checked - 5.6 - no diabetes.   Health Maintenance - Flu shot today.

## 2014-05-03 NOTE — Assessment & Plan Note (Signed)
   Well controlled. Continue same medications.  BP Readings from Last 3 Encounters:  05/03/14 136/64  01/25/13 128/75  05/08/12 126/70

## 2014-05-03 NOTE — Assessment & Plan Note (Signed)
    Mini Cog done - 1 recall + CDT normal - no cognitive deficit screened.   Mini-Cog will be scanned in and kept in the records.

## 2014-05-24 DIAGNOSIS — I10 Essential (primary) hypertension: Secondary | ICD-10-CM | POA: Diagnosis not present

## 2014-05-24 DIAGNOSIS — H409 Unspecified glaucoma: Secondary | ICD-10-CM | POA: Diagnosis not present

## 2014-05-24 DIAGNOSIS — Z961 Presence of intraocular lens: Secondary | ICD-10-CM | POA: Diagnosis not present

## 2014-05-24 DIAGNOSIS — Z970 Presence of artificial eye: Secondary | ICD-10-CM | POA: Diagnosis not present

## 2014-08-09 ENCOUNTER — Encounter: Payer: Medicare Other | Admitting: Internal Medicine

## 2014-08-09 ENCOUNTER — Other Ambulatory Visit: Payer: Self-pay | Admitting: Internal Medicine

## 2014-09-20 ENCOUNTER — Encounter: Payer: Self-pay | Admitting: Internal Medicine

## 2014-09-20 ENCOUNTER — Ambulatory Visit (INDEPENDENT_AMBULATORY_CARE_PROVIDER_SITE_OTHER): Payer: Medicare Other | Admitting: Internal Medicine

## 2014-09-20 VITALS — BP 130/70 | HR 60 | Temp 97.5°F | Ht 62.0 in | Wt 152.1 lb

## 2014-09-20 DIAGNOSIS — E785 Hyperlipidemia, unspecified: Secondary | ICD-10-CM | POA: Diagnosis not present

## 2014-09-20 DIAGNOSIS — M1990 Unspecified osteoarthritis, unspecified site: Secondary | ICD-10-CM | POA: Diagnosis not present

## 2014-09-20 DIAGNOSIS — M791 Myalgia: Secondary | ICD-10-CM

## 2014-09-20 DIAGNOSIS — Z0000 Encounter for general adult medical examination without abnormal findings: Secondary | ICD-10-CM | POA: Diagnosis not present

## 2014-09-20 DIAGNOSIS — Z23 Encounter for immunization: Secondary | ICD-10-CM

## 2014-09-20 DIAGNOSIS — M7918 Myalgia, other site: Secondary | ICD-10-CM | POA: Insufficient documentation

## 2014-09-20 DIAGNOSIS — I10 Essential (primary) hypertension: Secondary | ICD-10-CM | POA: Diagnosis not present

## 2014-09-20 LAB — COMPLETE METABOLIC PANEL WITH GFR
ALK PHOS: 128 U/L — AB (ref 39–117)
ALT: 13 U/L (ref 0–35)
AST: 18 U/L (ref 0–37)
Albumin: 4.1 g/dL (ref 3.5–5.2)
BUN: 9 mg/dL (ref 6–23)
CHLORIDE: 104 meq/L (ref 96–112)
CO2: 28 meq/L (ref 19–32)
Calcium: 9.7 mg/dL (ref 8.4–10.5)
Creat: 0.78 mg/dL (ref 0.50–1.10)
GFR, Est African American: 85 mL/min
GFR, Est Non African American: 74 mL/min
Glucose, Bld: 88 mg/dL (ref 70–99)
POTASSIUM: 3.2 meq/L — AB (ref 3.5–5.3)
Sodium: 143 mEq/L (ref 135–145)
TOTAL PROTEIN: 6.7 g/dL (ref 6.0–8.3)
Total Bilirubin: 0.5 mg/dL (ref 0.2–1.2)

## 2014-09-20 LAB — CK: Total CK: 144 U/L (ref 7–177)

## 2014-09-20 MED ORDER — HYDROCHLOROTHIAZIDE 25 MG PO TABS: 25.0000 mg | ORAL_TABLET | Freq: Every day | ORAL | Status: DC

## 2014-09-20 MED ORDER — ATORVASTATIN CALCIUM 20 MG PO TABS: 20.0000 mg | ORAL_TABLET | Freq: Every day | ORAL | Status: DC

## 2014-09-20 MED ORDER — AMLODIPINE BESYLATE 5 MG PO TABS: 5.0000 mg | ORAL_TABLET | Freq: Every day | ORAL | Status: DC

## 2014-09-20 NOTE — Patient Instructions (Signed)
[name],   Please do not take PRAVASTATIN and ATORVASTATIN together. Stop PRAVASTATIN. We will do a blood test today and I will call you if results are abnormal.   Thanks, Diamond Edouard MD MPH 09/20/2014 9:54 AM   Osteoarthritis Osteoarthritis is a disease that causes soreness and inflammation of a joint. It occurs when the cartilage at the affected joint wears down. Cartilage acts as a cushion, covering the ends of bones where they meet to form a joint. Osteoarthritis is the most common form of arthritis. It often occurs in older people. The joints affected most often by this condition include those in the:  Ends of the fingers.  Thumbs.  Neck.  Lower back.  Knees.  Hips. CAUSES  Over time, the cartilage that covers the ends of bones begins to wear away. This causes bone to rub on bone, producing pain and stiffness in the affected joints.  RISK FACTORS Certain factors can increase your chances of having osteoarthritis, including:  Older age.  Excessive body weight.  Overuse of joints.  Previous joint injury. SIGNS AND SYMPTOMS   Pain, swelling, and stiffness in the joint.  Over time, the joint may lose its normal shape.  Small deposits of bone (osteophytes) may grow on the edges of the joint.  Bits of bone or cartilage can break off and float inside the joint space. This may cause more pain and damage. DIAGNOSIS  Your health care provider will do a physical exam and ask about your symptoms. Various tests may be ordered, such as:  X-rays of the affected joint.  An MRI scan.  Blood tests to rule out other types of arthritis.  Joint fluid tests. This involves using a needle to draw fluid from the joint and examining the fluid under a microscope. TREATMENT  Goals of treatment are to control pain and improve joint function. Treatment plans may include:  A prescribed exercise program that allows for rest and joint relief.  A weight control plan.  Pain  relief techniques, such as:  Properly applied heat and cold.  Electric pulses delivered to nerve endings under the skin (transcutaneous electrical nerve stimulation [TENS]).  Massage.  Certain nutritional supplements.  Medicines to control pain, such as:  Acetaminophen.  Nonsteroidal anti-inflammatory drugs (NSAIDs), such as naproxen.  Narcotic or central-acting agents, such as tramadol.  Corticosteroids. These can be given orally or as an injection.  Surgery to reposition the bones and relieve pain (osteotomy) or to remove loose pieces of bone and cartilage. Joint replacement may be needed in advanced states of osteoarthritis. HOME CARE INSTRUCTIONS   Take medicines only as directed by your health care provider.  Maintain a healthy weight. Follow your health care provider's instructions for weight control. This may include dietary instructions.  Exercise as directed. Your health care provider can recommend specific types of exercise. These may include:  Strengthening exercises. These are done to strengthen the muscles that support joints affected by arthritis. They can be performed with weights or with exercise bands to add resistance.  Aerobic activities. These are exercises, such as brisk walking or low-impact aerobics, that get your heart pumping.  Range-of-motion activities. These keep your joints limber.  Balance and agility exercises. These help you maintain daily living skills.  Rest your affected joints as directed by your health care provider.  Keep all follow-up visits as directed by your health care provider. SEEK MEDICAL CARE IF:   Your skin turns red.  You develop a rash in addition to  your joint pain.  You have worsening joint pain.  You have a fever along with joint or muscle aches. SEEK IMMEDIATE MEDICAL CARE IF:  You have a significant loss of weight or appetite.  You have night sweats. FOR MORE INFORMATION   National Institute of Arthritis  and Musculoskeletal and Skin Diseases: www.niams.http://www.myers.net/nih.gov  [employer]: https://walker.com/www.nia.nih.gov  American College of Rheumatology: www.rheumatology.org Document Released: 08/12/2005 Document Revised: 12/27/2013 Document Reviewed: 04/19/2013 [facility] Patient Information 2015 [place], [place]. This information is not intended to replace advice given to you by your health care provider. Make sure you discuss any questions you have with your health care provider.

## 2014-09-21 DIAGNOSIS — Z0000 Encounter for general adult medical examination without abnormal findings: Secondary | ICD-10-CM | POA: Insufficient documentation

## 2014-09-21 NOTE — Assessment & Plan Note (Addendum)
Has been taking 2 statins in error.  Will check CMP and CK  ADDENDUM  CK and Liver enzymes within normal limits

## 2014-09-21 NOTE — Assessment & Plan Note (Signed)
Conservative management discussed.

## 2014-09-21 NOTE — Assessment & Plan Note (Signed)
BP Readings from Last 3 Encounters:  09/20/14 130/70  05/03/14 136/64  01/25/13 128/75    Well controlled. However, patient has mildly low K likely secondary to HCTZ. I will repeat BMP in 1 week. If still lower, I will decrease HCTZ dosage.  I will also check Mg.

## 2014-09-21 NOTE — Progress Notes (Signed)
   Subjective:    Patient ID: [name], female    DOB: 16-Jun-1936, 79 y.o.   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   Hypertension - Taking Norvasc 5 mg, HCTZ 25mg , atenolol 25mg .   Dyslipidemia - Was changed from pravastatin to atorvastatin last visit. She has been taking both medications consistently. She reports some muscle aches of mild severity, however she says that she always has muscles aches. She does not have any nausea, vomiting, diarrhea, abdominal pain.     Review of Systems  Constitutional: Negative for fever.  Cardiovascular: Negative for chest pain, palpitations and leg swelling.  Gastrointestinal: Negative for nausea, vomiting, abdominal pain, diarrhea, constipation and abdominal distention.  Musculoskeletal: Positive for myalgias, back pain and arthralgias. Negative for joint swelling.  Neurological: Negative for dizziness and headaches.       Objective:   Physical Exam  Constitutional: She is oriented to person, place, and time. She appears well-developed and well-nourished. No distress.  HENT:  Head: Normocephalic and atraumatic.  Right Ear: External ear normal.  Left Ear: External ear normal.  Mouth/Throat: Oropharynx is clear and moist.  Eyes:  Right, implant; Left irodotomy  Neck: Normal range of motion.  Cardiovascular: Normal rate, regular rhythm and intact distal pulses.   Murmur heard. Pulmonary/Chest: Effort normal.  Abdominal: Soft. Bowel sounds are normal.  Musculoskeletal: She exhibits no edema.  Neurological: She is alert and oriented to person, place, and time.  Skin: Skin is warm. She is not diaphoretic.       Assessment & Plan:   Please see problem based charting.

## 2014-09-21 NOTE — Assessment & Plan Note (Signed)
Prevnar today.

## 2014-11-07 DIAGNOSIS — I10 Essential (primary) hypertension: Secondary | ICD-10-CM | POA: Diagnosis not present

## 2014-11-07 DIAGNOSIS — Z961 Presence of intraocular lens: Secondary | ICD-10-CM | POA: Diagnosis not present

## 2014-11-07 DIAGNOSIS — H18412 Arcus senilis, left eye: Secondary | ICD-10-CM | POA: Diagnosis not present

## 2014-11-07 DIAGNOSIS — H409 Unspecified glaucoma: Secondary | ICD-10-CM | POA: Diagnosis not present

## 2014-12-22 ENCOUNTER — Other Ambulatory Visit: Payer: Self-pay | Admitting: *Deleted

## 2014-12-22 MED ORDER — ATENOLOL 25 MG PO TABS: 25.0000 mg | ORAL_TABLET | Freq: Every day | ORAL | Status: DC

## 2015-01-17 ENCOUNTER — Other Ambulatory Visit: Payer: Self-pay | Admitting: *Deleted

## 2015-01-17 ENCOUNTER — Other Ambulatory Visit: Payer: Self-pay | Admitting: Internal Medicine

## 2015-01-17 DIAGNOSIS — E785 Hyperlipidemia, unspecified: Secondary | ICD-10-CM

## 2015-01-17 MED ORDER — ATORVASTATIN CALCIUM 20 MG PO TABS: 20.0000 mg | ORAL_TABLET | Freq: Every day | ORAL | Status: DC

## 2015-05-10 NOTE — Addendum Note (Signed)
Addended by: Bufford Spikes on: 05/10/2015 11:02 AM   Modules accepted: Orders

## 2015-05-12 DIAGNOSIS — H409 Unspecified glaucoma: Secondary | ICD-10-CM | POA: Diagnosis not present

## 2015-05-12 DIAGNOSIS — Z970 Presence of artificial eye: Secondary | ICD-10-CM | POA: Diagnosis not present

## 2015-05-12 DIAGNOSIS — Z961 Presence of intraocular lens: Secondary | ICD-10-CM | POA: Diagnosis not present

## 2015-05-30 ENCOUNTER — Encounter: Payer: Self-pay | Admitting: Internal Medicine

## 2015-05-30 ENCOUNTER — Ambulatory Visit (INDEPENDENT_AMBULATORY_CARE_PROVIDER_SITE_OTHER): Payer: Medicare Other | Admitting: Internal Medicine

## 2015-05-30 VITALS — BP 129/52 | HR 56 | Temp 97.7°F | Wt 145.3 lb

## 2015-05-30 DIAGNOSIS — Z79899 Other long term (current) drug therapy: Secondary | ICD-10-CM | POA: Diagnosis not present

## 2015-05-30 DIAGNOSIS — I10 Essential (primary) hypertension: Secondary | ICD-10-CM

## 2015-05-30 DIAGNOSIS — E785 Hyperlipidemia, unspecified: Secondary | ICD-10-CM

## 2015-05-30 NOTE — Assessment & Plan Note (Signed)
BP Readings from Last 3 Encounters:  05/30/15 129/52  09/20/14 130/70  05/03/14 136/64       Component Value Date/Time   NA 143 09/20/2014 1024   K 3.2* 09/20/2014 1024   CREATININE 0.78 09/20/2014 1024   CREATININE 0.85 10/10/2009 1820    Assessment:  Patient with HTN compliant with 3 class (CCB, BB, diuretic) anti-hypertensive therapy who presents today with blood pressure of 129/52. Denies headaches, chest pain, edema, dizziness or lightheadedness  Blood pressure control: controlled Progress towards BP goal: at goal Comments: patient doing well on current regimen and without any complaints  Plan:   -Medication changes: none -Labs: BMP -Other plans: at last [facility] visit, patients potassium was mildly low.  Will recheck today.  If still low, can consider decreasing HCTZ dose.  Can also consider checking magnesium level if low -Encourage home BP monitoring 3 times per week or at drug store occasionally  -Encourage regular exercise and healthy diet (decreased salt intake)  Medications after today's visit: 1. Atenolol  daily 2. Amlodipine  daily 3. HCTZ 25 mg daily

## 2015-05-30 NOTE — Assessment & Plan Note (Signed)
Assessment:  Patient with hyperlipidemia compliant with atorvastatin.  Last lipid panel was September 2015 with TC 203, TG 101, HDL 71, LDL 112  Goal LDL: 100 Progress towards LDL goal: near goal Comments: will not check lipid panel today as patient near goal LDL on atorvastatin  for primary prevention.  Previous LFTs, CK normal.  Plan:   -Medications: Atorvastatin  daily  -Labs: none -Other plans: none -Encourage lifestyle modifications to lower cardiovascular disease risk.  This includes eating a heart-healthy diet, regular aerobic exercise, maintenance of desirable body weight, and avoidance of tobacco products

## 2015-05-30 NOTE — Progress Notes (Signed)
Patient ID: [name], female   DOB: 04-01-1936, 79 y.o.   MRN: [id number]   Subjective:   Patient ID: [name] female   DOB: Oct 29, 1935 79 y.o.   MRN: [id number]  HPI: [name] is a 79 y.o. female with past medical history of familial glaucoma that has resulted in blindness of the right eye and reduced to tunnel vision of the left eye.  Patient also with history of hyperlipidemia and hypertension.  Patient is here today for follow up of her hypertension and hyperlipidemia.  She has no other complaints today.  Will provide flu vaccine today.    Past Medical History  Diagnosis Date  . Hypertension   . HLD (hyperlipidemia)   . CAD (coronary artery disease)     nonobstructive, myoview normal 03/2007, done by Dr. Daleen Squibb - EF = 75%  . Glaucoma    Current Outpatient Prescriptions  Medication Sig Dispense Refill  . amLODipine (NORVASC) 5 MG tablet Take 1 tablet (5 mg total) by mouth daily. 90 tablet 3  . atenolol (TENORMIN) 25 MG tablet Take 1 tablet (25 mg total) by mouth daily. 90 tablet 3  . atorvastatin (LIPITOR) 20 MG tablet Take 1 tablet (20 mg total) by mouth daily. 90 tablet 3  . bimatoprost (LUMIGAN) 0.03 % ophthalmic drops Place 1 drop into the left eye at bedtime.      . brimonidine (ALPHAGAN P) 0.1 % SOLN 1 drop in left eye twice a day    . dorzolamide-timolol (COSOPT) 22.3-6.8 MG/ML ophthalmic solution 1 drop 2 (two) times daily.      . Fluoxetine HCl, PMDD, 20 MG CAPS Take 1 capsule (20 mg total) by mouth once. 30 each 2  . hydrochlorothiazide (HYDRODIURIL) 25 MG tablet Take 1 tablet (25 mg total) by mouth daily. 90 tablet 3  . nitroGLYCERIN (NITROQUICK) 0.4 MG SL tablet Place 1 tablet (0.4 mg total) under the tongue every 5 (five) minutes as needed. 90 tablet 3   No current facility-administered medications for this visit.   Family History  Problem Relation Age of Onset  . Stroke Neg Hx   . Cancer Neg Hx    Social History   Social History  . Marital  Status: Widowed    Spouse Name: N/A  . Number of Children: N/A  . Years of Education: N/A   Social History Main Topics  . Smoking status: Former Smoker    Types: Cigarettes  . Smokeless tobacco: None  . Alcohol Use: No  . Drug Use: No  . Sexual Activity: Not Asked   Other Topics Concern  . None   Social History Narrative   Has a son and a daughter, granddaughter spends a lot of time at her house. Sister and she live together for many years. Sister was living with her mother and Ms. Mom was married but the mother passed away and her husband passed away so they moved in together.   Review of Systems: Review of Systems  Constitutional: Negative for fever and chills.  HENT: Negative for congestion.   Eyes:       Blindness of right eye.  Tunnel vision of left eye  Respiratory: Negative for cough and shortness of breath.   Cardiovascular: Negative for chest pain, palpitations and leg swelling.  Gastrointestinal: Negative for nausea, vomiting, abdominal pain, diarrhea and constipation.  Genitourinary: Negative for dysuria and frequency.  Musculoskeletal: Negative for back pain and joint pain.  Skin: Negative for rash.  Neurological: Negative for dizziness and headaches.  Psychiatric/Behavioral: Negative for depression. The patient does not have insomnia.     Objective:  Physical Exam: Filed Vitals:   05/30/15 0954  BP: 129/52  Pulse: 56  Temp: 97.7 F (36.5 C)  TempSrc: Oral  Weight: 145 lb 4.8 oz (65.908 kg)  SpO2: 97%   Physical Exam  Constitutional: She is oriented to person, place, and time. She appears well-developed and well-nourished.  HENT:  Head: Normocephalic and atraumatic.  Neck: Normal range of motion. Neck supple.  Cardiovascular: Regular rhythm, normal heart sounds and intact distal pulses.   Mild bradycardia  Pulmonary/Chest: Effort normal and breath sounds normal. No respiratory distress. She has no wheezes.  Abdominal: Soft. Bowel sounds are  normal.  Musculoskeletal: Normal range of motion. She exhibits no edema.  Lymphadenopathy:    She has no cervical adenopathy.  Neurological: She is alert and oriented to person, place, and time.  Skin: Skin is warm and dry.  Psychiatric: She has a normal mood and affect.    Assessment & Plan:  Please see Problem List for Assessment and Plan

## 2015-05-30 NOTE — Patient Instructions (Signed)
High Blood Pressure Your BP is well controlled Your goal is to have a BP average < 140/90 Medicine Changes: none Homework: Continue to exercise regularly and eat a well-balanced diet, especially monitoring your salt intake.  Monitor your blood pressure at home 3 times per week or occasionally at the drug store   Come back to see [place] in: 1 year, or sooner if you need to  Continue to exercise regularly and eat a well-balanced diet  Today, you will get some blood work done to make sure your electrolytes and kidney function are doing well.

## 2015-07-03 ENCOUNTER — Encounter: Payer: Self-pay | Admitting: *Deleted

## 2015-08-01 NOTE — Addendum Note (Signed)
Addended by: Kathlynn GrateWALLACE, Felton Buczynski N on: 08/01/2015 05:00 PM   Modules accepted: Orders

## 2015-08-13 ENCOUNTER — Other Ambulatory Visit: Payer: Self-pay | Admitting: Internal Medicine

## 2015-10-14 ENCOUNTER — Other Ambulatory Visit: Payer: Self-pay | Admitting: Internal Medicine

## 2015-11-10 DIAGNOSIS — Z961 Presence of intraocular lens: Secondary | ICD-10-CM | POA: Diagnosis not present

## 2015-11-10 DIAGNOSIS — I10 Essential (primary) hypertension: Secondary | ICD-10-CM | POA: Diagnosis not present

## 2015-11-10 DIAGNOSIS — H18412 Arcus senilis, left eye: Secondary | ICD-10-CM | POA: Diagnosis not present

## 2015-11-10 DIAGNOSIS — H409 Unspecified glaucoma: Secondary | ICD-10-CM | POA: Diagnosis not present

## 2015-11-30 ENCOUNTER — Other Ambulatory Visit: Payer: Self-pay | Admitting: Internal Medicine

## 2015-11-30 DIAGNOSIS — I10 Essential (primary) hypertension: Secondary | ICD-10-CM

## 2015-12-24 ENCOUNTER — Other Ambulatory Visit: Payer: Self-pay | Admitting: Internal Medicine

## 2015-12-25 NOTE — Telephone Encounter (Signed)
Last seen 05/30/2015. Told to return in 1 year.

## 2016-02-08 ENCOUNTER — Other Ambulatory Visit: Payer: Self-pay

## 2016-02-08 NOTE — Telephone Encounter (Signed)
Pt requesting all meds to be filled @ [facility].

## 2016-02-08 NOTE — Telephone Encounter (Signed)
Pt has refills on all her meds, called her and addressed each with her, she is good

## 2016-02-14 ENCOUNTER — Emergency Department (HOSPITAL_COMMUNITY)
Admission: EM | Admit: 2016-02-14 | Discharge: 2016-02-14 | Disposition: A | Discharge status: Home / Self Care | Payer: Medicare Other | Attending: Emergency Medicine | Admitting: Emergency Medicine

## 2016-02-14 ENCOUNTER — Encounter (HOSPITAL_COMMUNITY): Payer: Self-pay

## 2016-02-14 ENCOUNTER — Emergency Department (HOSPITAL_COMMUNITY): Payer: Medicare Other

## 2016-02-14 ENCOUNTER — Other Ambulatory Visit: Payer: Self-pay

## 2016-02-14 DIAGNOSIS — Z87891 Personal history of nicotine dependence: Secondary | ICD-10-CM | POA: Insufficient documentation

## 2016-02-14 DIAGNOSIS — I10 Essential (primary) hypertension: Secondary | ICD-10-CM | POA: Insufficient documentation

## 2016-02-14 DIAGNOSIS — R51 Headache: Secondary | ICD-10-CM | POA: Insufficient documentation

## 2016-02-14 DIAGNOSIS — M542 Cervicalgia: Secondary | ICD-10-CM | POA: Diagnosis not present

## 2016-02-14 DIAGNOSIS — R0789 Other chest pain: Secondary | ICD-10-CM | POA: Diagnosis not present

## 2016-02-14 DIAGNOSIS — E785 Hyperlipidemia, unspecified: Secondary | ICD-10-CM | POA: Diagnosis not present

## 2016-02-14 DIAGNOSIS — I2510 Atherosclerotic heart disease of native coronary artery without angina pectoris: Secondary | ICD-10-CM | POA: Insufficient documentation

## 2016-02-14 DIAGNOSIS — M62838 Other muscle spasm: Secondary | ICD-10-CM | POA: Diagnosis not present

## 2016-02-14 DIAGNOSIS — M62830 Muscle spasm of back: Secondary | ICD-10-CM | POA: Diagnosis not present

## 2016-02-14 LAB — I-STAT CHEM 8, ED
BUN: 15 mg/dL (ref 6–20)
CALCIUM ION: 1.07 mmol/L — AB (ref 1.13–1.30)
CHLORIDE: 98 mmol/L — AB (ref 101–111)
CREATININE: 0.8 mg/dL (ref 0.44–1.00)
Glucose, Bld: 124 mg/dL — ABNORMAL HIGH (ref 65–99)
HCT: 40 % (ref 36.0–46.0)
Hemoglobin: 13.6 g/dL (ref 12.0–15.0)
Potassium: 2.5 mmol/L — CL (ref 3.5–5.1)
Sodium: 139 mmol/L (ref 135–145)
TCO2: 27 mmol/L (ref 0–100)

## 2016-02-14 LAB — CBC WITH DIFFERENTIAL/PLATELET
Basophils Absolute: 0 10*3/uL (ref 0.0–0.1)
Basophils Relative: 1 %
EOS ABS: 0.1 10*3/uL (ref 0.0–0.7)
EOS PCT: 1 %
HCT: 39.5 % (ref 36.0–46.0)
HEMOGLOBIN: 13.6 g/dL (ref 12.0–15.0)
LYMPHS ABS: 1.7 10*3/uL (ref 0.7–4.0)
LYMPHS PCT: 27 %
MCH: 29.3 pg (ref 26.0–34.0)
MCHC: 34.4 g/dL (ref 30.0–36.0)
MCV: 85.1 fL (ref 78.0–100.0)
MONOS PCT: 6 %
Monocytes Absolute: 0.4 10*3/uL (ref 0.1–1.0)
NEUTROS PCT: 65 %
Neutro Abs: 4.1 10*3/uL (ref 1.7–7.7)
Platelets: 236 10*3/uL (ref 150–400)
RBC: 4.64 MIL/uL (ref 3.87–5.11)
RDW: 13 % (ref 11.5–15.5)
WBC: 6.4 10*3/uL (ref 4.0–10.5)

## 2016-02-14 LAB — I-STAT TROPONIN, ED: TROPONIN I, POC: 0.01 ng/mL (ref 0.00–0.08)

## 2016-02-14 MED ORDER — FENTANYL CITRATE (PF) 100 MCG/2ML IJ SOLN
50.0000 ug | Freq: Once | INTRAMUSCULAR | Status: AC
  Administered 2016-02-14: 50 ug via INTRAVENOUS
  Filled 2016-02-14: qty 2

## 2016-02-14 MED ORDER — POTASSIUM CHLORIDE CRYS ER 20 MEQ PO TBCR
40.0000 meq | EXTENDED_RELEASE_TABLET | Freq: Once | ORAL | Status: AC
  Administered 2016-02-14: 40 meq via ORAL
  Filled 2016-02-14: qty 2

## 2016-02-14 MED ORDER — HYDROCODONE-ACETAMINOPHEN 5-325 MG PO TABS
1.0000 | ORAL_TABLET | Freq: Once | ORAL | Status: AC
  Administered 2016-02-14: 1 via ORAL
  Filled 2016-02-14: qty 1

## 2016-02-14 MED ORDER — DIAZEPAM 5 MG PO TABS
5.0000 mg | ORAL_TABLET | Freq: Once | ORAL | Status: AC
  Administered 2016-02-14: 5 mg via ORAL
  Filled 2016-02-14: qty 1

## 2016-02-14 MED ORDER — METHOCARBAMOL 500 MG PO TABS: 500.0000 mg | ORAL_TABLET | Freq: Two times a day (BID) | ORAL | Status: DC

## 2016-02-14 MED ORDER — HYDROCODONE-ACETAMINOPHEN 5-325 MG PO TABS: 1.0000 | ORAL_TABLET | Freq: Four times a day (QID) | ORAL | Status: DC | PRN

## 2016-02-14 MED ORDER — HYDROCODONE-ACETAMINOPHEN 5-325 MG PO TABS
1.0000 | ORAL_TABLET | Freq: Once | ORAL | Status: AC
Start: 2016-02-14 — End: 2016-02-14
  Administered 2016-02-14: 1 via ORAL
  Filled 2016-02-14: qty 1

## 2016-02-14 MED ORDER — IOPAMIDOL (ISOVUE-370) INJECTION 76%
100.0000 mL | Freq: Once | INTRAVENOUS | Status: AC | PRN
  Administered 2016-02-14: 100 mL via INTRAVENOUS

## 2016-02-14 NOTE — ED Provider Notes (Signed)
CSN: [id number]     Arrival date & time 02/14/16  0145 History  By signing my name below, I, [name], attest that this documentation has been prepared under the direction and in the presence of Derwood Kaplan, MD. Electronically Signed: Angelene Giovanni, ED Scribe. 02/14/2016. 4:02 AM.     Chief Complaint  Patient presents with  . Neck Pain   Patient is a 80 y.o. female presenting with neck pain. The history is provided by the patient. No language interpreter was used.  Neck Pain Pain location:  L side Pain radiates to:  Does not radiate Pain severity:  Moderate Pain is:  Same all the time Onset quality:  Gradual Timing:  Constant Progression:  Worsening Chronicity:  New Context: not fall   Relieved by:  None tried Worsened by:  Nothing tried Ineffective treatments:  None tried Associated symptoms: headaches   Associated symptoms: no bladder incontinence, no bowel incontinence, no numbness and no tingling   Risk factors: no recent head injury    HPI Comments: [name] is a 80 y.o. female with a hx of HTN, HLD, and CAD who presents to the Emergency Department complaining of sudden onset of gradually worsening left sided and posterior neck pain onset yesterday afternoon. She reports associated throbbing pain to the top of her head and pain with ROM of the neck. She denies that any specific movements worsens the pain. No alleviating factors noted. Pt has not tried any medications PTA. She denies any recent falls, injuries, or trauma. She also denies any n/v, numbness, or tingling.     Past Medical History  Diagnosis Date  . Hypertension   . HLD (hyperlipidemia)   . CAD (coronary artery disease)     nonobstructive, myoview normal 03/2007, done by Dr. Daleen Squibb - EF = 75%  . Glaucoma    History reviewed. No pertinent past surgical history. Family History  Problem Relation Age of Onset  . Stroke Neg Hx   . Cancer Neg Hx    Social History  Substance Use Topics  .  Smoking status: Former Smoker    Types: Cigarettes  . Smokeless tobacco: None  . Alcohol Use: No   OB History    No data available     Review of Systems  Gastrointestinal: Negative for bowel incontinence.  Genitourinary: Negative for bladder incontinence.  Musculoskeletal: Positive for neck pain.  Neurological: Positive for headaches. Negative for tingling and numbness.   A complete 10 system review of systems was obtained and all systems are negative except as noted in the HPI and PMH.     Allergies  Aspirin and Codeine  Home Medications   Prior to Admission medications   Medication Sig Start Date End Date Taking? Authorizing Provider  acetaminophen (TYLENOL) 325 MG tablet Take 650 mg by mouth every 6 (six) hours as needed for mild pain, moderate pain or headache.   Yes Historical Provider, MD  amLODipine (NORVASC) 5 MG tablet TAKE 1 TABLET DAILY 10/16/15  Yes Tyson Alias, MD  atenolol (TENORMIN) 25 MG tablet Take 1 tablet (25 mg total) by mouth daily. 11/30/15  Yes Doneen Poisson, MD  atorvastatin (LIPITOR) 20 MG tablet TAKE 1 TABLET DAILY Patient taking differently: Take 20 mg by mouth daily 12/25/15  Yes Nischal Narendra, MD  bimatoprost (LUMIGAN) 0.03 % ophthalmic drops Place 1 drop into the left eye at bedtime.     Yes Historical Provider, MD  brimonidine (ALPHAGAN P) 0.1 % SOLN 1 drop in left eye  twice a day   Yes Historical Provider, MD  Fluoxetine HCl, PMDD, 20 MG CAPS Take 1 capsule (20 mg total) by mouth once. Patient taking differently: Take 20 mg by mouth daily.  01/09/12  Yes Novella RobSonya Blizzard, MD  hydrochlorothiazide (HYDRODIURIL) 25 MG tablet TAKE 1 TABLET DAILY Patient taking differently: Take 25 mg by mouth daily 08/14/15  Yes Tyson Aliasuncan Thomas Vincent, MD  nitroGLYCERIN (NITROQUICK) 0.4 MG SL tablet Place 1 tablet (0.4 mg total) under the tongue every 5 (five) minutes as needed. 06/27/11  Yes Novella RobSonya Blizzard, MD  HYDROcodone-acetaminophen (NORCO/VICODIN) 5-325 MG  tablet Take 1 tablet by mouth every 6 (six) hours as needed. 02/14/16   Derwood KaplanAnkit Debora Stockdale, MD  methocarbamol (ROBAXIN) 500 MG tablet Take 1 tablet (500 mg total) by mouth 2 (two) times daily. 02/14/16   Arilyn Brierley, MD   BP 136/61 mmHg  Pulse 63  Temp(Src) 97.4 F (36.3 C) (Oral)  Resp 18  Ht 5' 5.5" (1.664 m)  Wt 134 lb (60.782 kg)  BMI 21.95 kg/m2  SpO2 96% Physical Exam  Constitutional: She is oriented to person, place, and time. She appears well-developed and well-nourished.  HENT:  Head: Normocephalic and atraumatic.  Eyes: EOM are normal.  Pupil on left eye is 2 mm and reactive   Neck:  No carotid bruits  Cardiovascular: Normal rate and regular rhythm.   Pulmonary/Chest: Effort normal.  Lungs clear  Musculoskeletal:  BUE strength 4/5 No midline C-spine tenderness Pt has reproducible paraspinal tenderness to left side of neck, tenderness worse with lateral neck movement.   Neurological: She is alert and oriented to person, place, and time. No cranial nerve deficit.  Gross sensory exam upper and lower extremities normal  Skin: Skin is warm and dry.  Psychiatric: She has a normal mood and affect.  Nursing note and vitals reviewed.   ED Course  Procedures (including critical care time) DIAGNOSTIC STUDIES: Oxygen Saturation is 98% on RA, normal by my interpretation.    COORDINATION OF CARE: 3:53 AM- Pt advised of plan for treatment and pt agrees. Pt will receive Vicodin and Fentanyl. She will also receive CT neck, CT head, and lab work for further evaluation.    Labs Review Labs Reviewed  I-STAT CHEM 8, ED - Abnormal; Notable for the following:    Potassium 2.5 (*)    Chloride 98 (*)    Glucose, Bld 124 (*)    Calcium, Ion 1.07 (*)    All other components within normal limits  CBC WITH DIFFERENTIAL/PLATELET  I-STAT TROPOININ, ED    Imaging Review Ct Angio Head W/cm &/or Wo Cm  02/14/2016  CLINICAL DATA:  Posterior throbbing head pain. Neck pain and rigidity.  History of hypertension and hyperlipidemia. EXAM: CT ANGIOGRAPHY HEAD AND NECK TECHNIQUE: Multidetector CT imaging of the head and neck was performed using the standard protocol during bolus administration of intravenous contrast. Multiplanar CT image reconstructions and MIPs were obtained to evaluate the vascular anatomy. Carotid stenosis measurements (when applicable) are obtained utilizing NASCET criteria, using the distal internal carotid diameter as the denominator. CONTRAST:  100 cc Isovue 370 COMPARISON:  None. FINDINGS: CT HEAD INTRACRANIAL CONTENTS: The ventricles and sulci are normal for age. No intraparenchymal hemorrhage, mass effect nor midline shift. Patchy supratentorial white matter hypodensities are less than expected for patient's age and though non-specific likely represent chronic small vessel ischemic disease. No acute large vascular territory infarcts. No abnormal extra-axial fluid collections. Basal cisterns are patent. Mild to moderate calcific atherosclerosis of the  carotid siphons. No abnormal intracranial enhancement. ORBITS: The included ocular globes and orbital contents are non-suspicious. RIGHT ocular globe prosthesis. LEFT ocular lens implant. SINUSES: Mild RIGHT sphenoid mucosal thickening. Mastoid air cells are well aerated. SKULL/SOFT TISSUES: No skull fracture. No significant soft tissue swelling. CTA NECK (mildly motion degraded examination) Aortic arch: Normal appearance of the thoracic arch, normal branch pattern. Severe calcific atherosclerosis of the aortic arch. The origins of the innominate, left Common carotid artery and subclavian artery are widely patent. Right carotid system: Common carotid artery is widely patent, coursing in a straight line fashion. Normal appearance of the carotid bifurcation without hemodynamically significant stenosis by NASCET criteria. Mild calcific atherosclerosis RIGHT internal carotid artery. Normal appearance of the included internal carotid  artery. Left carotid system: Common carotid artery is widely patent, coursing in a straight line fashion. Normal appearance of the carotid bifurcation without hemodynamically significant stenosis by NASCET criteria. Normal appearance of the included internal carotid artery. Vertebral arteries:Bilateral vertebral arteries arise from the subclavian arteries. Calcific atherosclerosis results in mild stenosis LEFT vertebral artery origin. Nearly codominant vertebral arteries. Skeleton: No acute osseous process though bone windows have not been submitted. Multilevel moderate to severe disc height loss and uncovertebral hypertrophy compatible with degenerative discs. Multilevel moderate facet arthropathy. Other neck: Soft tissues of the neck are non-acute though, not tailored for evaluation. CTA HEAD Anterior circulation: Normal appearance of the cervical internal carotid arteries, petrous, cavernous and supra clinoid internal carotid arteries. Widely patent anterior communicating artery. Normal appearance of the anterior and middle cerebral arteries. Posterior circulation: Normal appearance of the vertebral arteries, vertebrobasilar junction and basilar artery, as well as main branch vessels. Widely patent posterior cerebral arteries with mild luminal regularity. No large vessel occlusion, hemodynamically significant stenosis, dissection, suspicious luminal irregularity, contrast extravasation or aneurysm within the anterior nor posterior circulation. IMPRESSION: CT HEAD:  Negative CT HEAD for age. CTA NECK: Severe calcific atherosclerosis of the aortic arch without hemodynamically significant stenosis or acute vascular process. Mild stenosis LEFT vertebral artery origin. CTA HEAD: No emergent large vessel occlusion or severe stenosis. Mild luminal irregularity of the posterior cerebral arteries compatible with atherosclerosis. Electronically Signed   By: Awilda Metro M.D.   On: 02/14/2016 05:50   Ct Angio Neck  W/cm &/or Wo/cm  02/14/2016  CLINICAL DATA:  Posterior throbbing head pain. Neck pain and rigidity. History of hypertension and hyperlipidemia. EXAM: CT ANGIOGRAPHY HEAD AND NECK TECHNIQUE: Multidetector CT imaging of the head and neck was performed using the standard protocol during bolus administration of intravenous contrast. Multiplanar CT image reconstructions and MIPs were obtained to evaluate the vascular anatomy. Carotid stenosis measurements (when applicable) are obtained utilizing NASCET criteria, using the distal internal carotid diameter as the denominator. CONTRAST:  100 cc Isovue 370 COMPARISON:  None. FINDINGS: CT HEAD INTRACRANIAL CONTENTS: The ventricles and sulci are normal for age. No intraparenchymal hemorrhage, mass effect nor midline shift. Patchy supratentorial white matter hypodensities are less than expected for patient's age and though non-specific likely represent chronic small vessel ischemic disease. No acute large vascular territory infarcts. No abnormal extra-axial fluid collections. Basal cisterns are patent. Mild to moderate calcific atherosclerosis of the carotid siphons. No abnormal intracranial enhancement. ORBITS: The included ocular globes and orbital contents are non-suspicious. RIGHT ocular globe prosthesis. LEFT ocular lens implant. SINUSES: Mild RIGHT sphenoid mucosal thickening. Mastoid air cells are well aerated. SKULL/SOFT TISSUES: No skull fracture. No significant soft tissue swelling. CTA NECK (mildly motion degraded examination) Aortic arch: Normal appearance of  the thoracic arch, normal branch pattern. Severe calcific atherosclerosis of the aortic arch. The origins of the innominate, left Common carotid artery and subclavian artery are widely patent. Right carotid system: Common carotid artery is widely patent, coursing in a straight line fashion. Normal appearance of the carotid bifurcation without hemodynamically significant stenosis by NASCET criteria. Mild  calcific atherosclerosis RIGHT internal carotid artery. Normal appearance of the included internal carotid artery. Left carotid system: Common carotid artery is widely patent, coursing in a straight line fashion. Normal appearance of the carotid bifurcation without hemodynamically significant stenosis by NASCET criteria. Normal appearance of the included internal carotid artery. Vertebral arteries:Bilateral vertebral arteries arise from the subclavian arteries. Calcific atherosclerosis results in mild stenosis LEFT vertebral artery origin. Nearly codominant vertebral arteries. Skeleton: No acute osseous process though bone windows have not been submitted. Multilevel moderate to severe disc height loss and uncovertebral hypertrophy compatible with degenerative discs. Multilevel moderate facet arthropathy. Other neck: Soft tissues of the neck are non-acute though, not tailored for evaluation. CTA HEAD Anterior circulation: Normal appearance of the cervical internal carotid arteries, petrous, cavernous and supra clinoid internal carotid arteries. Widely patent anterior communicating artery. Normal appearance of the anterior and middle cerebral arteries. Posterior circulation: Normal appearance of the vertebral arteries, vertebrobasilar junction and basilar artery, as well as main branch vessels. Widely patent posterior cerebral arteries with mild luminal regularity. No large vessel occlusion, hemodynamically significant stenosis, dissection, suspicious luminal irregularity, contrast extravasation or aneurysm within the anterior nor posterior circulation. IMPRESSION: CT HEAD:  Negative CT HEAD for age. CTA NECK: Severe calcific atherosclerosis of the aortic arch without hemodynamically significant stenosis or acute vascular process. Mild stenosis LEFT vertebral artery origin. CTA HEAD: No emergent large vessel occlusion or severe stenosis. Mild luminal irregularity of the posterior cerebral arteries compatible with  atherosclerosis. Electronically Signed   By: Awilda Metro M.D.   On: 02/14/2016 05:50     Derwood Kaplan, MD has personally reviewed and evaluated these images and lab results as part of his medical decision-making.   EKG Interpretation None      MDM   Final diagnoses:  Neck muscle spasm    I personally performed the services described in this documentation, which was scribed in my presence. The recorded information has been reviewed and is accurate.  PT comes in with cc of neck pain. Pt reports that the pain started at 4 pm yday, sudden onset that has gradually gotten worse. No anterior chest pain. No numbness, tingling associated with this. PT has some reproducible neck pain with palpation on the paraspinal region L side and with her turning the head to the R side. Pt had chest pain at night, which was central, but resolved by the time EMS arrived. Pt has no hx of pain like this and there is no specific evoking factor. Her bedside neuro exam is normal. Pt has neck stiffness with lateral movement only. She has no carotid bruits, and pulse are equal bilaterally.  Will get CT angio neck and head to r/o dissection, given sudden onset pain that is severe and has no specific evoking factor. If CT is neg, we will focus on more musculoskeletal causes. She is chest pain free right now. Trop ordered.   Derwood Kaplan, MD 02/14/16 [phone]

## 2016-02-14 NOTE — ED Notes (Signed)
Pt family standing in the hallway asking passersby for pain medicine. I explained to the family member that the doctor needs to evaluate the pt before that.

## 2016-02-14 NOTE — ED Notes (Signed)
BIB EMS from home. Pt complaining of neck pain on the L side radiating to the back of her neck. Denies recent fall or injury. Pt is able to touch chin to chest without any additional pain. Pt was complaining of centralized chest pressure was EMS that lasted approximately 40 mins, but resolved before arrival to the ED. 324 of aspirin given en route. A&Ox4.

## 2016-02-14 NOTE — ED Notes (Signed)
Pt able to ambulate to the restroom with stand by assistance. Pt needs to follow due to limited vision

## 2016-02-14 NOTE — ED Notes (Signed)
Pt ambulated to the restroom with standby assist.

## 2016-02-14 NOTE — ED Notes (Signed)
Bed: [id number] Expected date:  Expected time:  Means of arrival:  Comments: 80yo chest pain neck pressure HTN

## 2016-02-14 NOTE — Discharge Instructions (Signed)
We saw you in the ER for the neck pain. All the results in the ER are normal, labs and imaging. We are not sure what is causing your symptoms. The workup in the ER is not complete, and is limited to screening for life threatening and emergent conditions only, so please see a primary care doctor for further evaluation.  We recommend seeing your primary doctor soon, you might benefit from some physical therapy or consultation with a specialist.   Acute Torticollis Torticollis is a condition in which the muscles of the neck tighten (contract) abnormally, causing the neck to twist and the head to move into an unnatural position. Torticollis that develops suddenly is called acute torticollis. If torticollis becomes chronic and is left untreated, the face and neck can become deformed. CAUSES This condition may be caused by:  Sleeping in an awkward position (common).  Extending or twisting the neck muscles beyond their normal position.  Infection. In some cases, the cause may not be known. SYMPTOMS Symptoms of this condition include:  An unnatural position of the head.  Neck pain.  A limited ability to move the neck.  Twisting of the neck to one side. DIAGNOSIS This condition is diagnosed with a physical exam. You may also have imaging tests, such as an X-ray, CT scan, or MRI. TREATMENT Treatment for this condition involves trying to relax the neck muscles. It may include:  Medicines or shots.  Physical therapy.  Surgery. This may be done in severe cases. HOME CARE INSTRUCTIONS  Take medicines only as directed by your health care provider.  Do stretching exercises and massage your neck as directed by your health care provider.  Keep all follow-up visits as directed by your health care provider. This is important. SEEK MEDICAL CARE IF:  You develop a fever. SEEK IMMEDIATE MEDICAL CARE IF:  You develop difficulty breathing.  You develop noisy breathing (stridor).  You  start drooling.  You have trouble swallowing or have pain with swallowing.  You develop numbness or weakness in your hands or feet.  You have changes in your speech, understanding, or vision.  Your pain gets worse.   This information is not intended to replace advice given to you by your health care provider. Make sure you discuss any questions you have with your health care provider.   Document Released: 08/09/2000 Document Revised: 12/27/2014 Document Reviewed: 08/08/2014 Elsevier Interactive Patient Education [employer]2016 [employer] Inc[employer]

## 2016-02-14 NOTE — ED Notes (Signed)
MD made aware of pt hypokalemia.

## 2016-02-26 ENCOUNTER — Ambulatory Visit: Payer: Medicare Other

## 2016-02-26 ENCOUNTER — Encounter: Payer: Self-pay | Admitting: Student in an Organized Health Care Education/Training Program

## 2016-03-07 ENCOUNTER — Ambulatory Visit (INDEPENDENT_AMBULATORY_CARE_PROVIDER_SITE_OTHER): Payer: Medicare Other | Admitting: Internal Medicine

## 2016-03-07 ENCOUNTER — Encounter: Payer: Self-pay | Admitting: Internal Medicine

## 2016-03-07 VITALS — BP 150/68 | HR 68 | Temp 98.0°F | Wt 148.1 lb

## 2016-03-07 DIAGNOSIS — E876 Hypokalemia: Secondary | ICD-10-CM | POA: Diagnosis not present

## 2016-03-07 DIAGNOSIS — I10 Essential (primary) hypertension: Secondary | ICD-10-CM

## 2016-03-07 LAB — BASIC METABOLIC PANEL
ANION GAP: 6 (ref 5–15)
BUN: 11 mg/dL (ref 6–20)
CALCIUM: 9.4 mg/dL (ref 8.9–10.3)
CO2: 32 mmol/L (ref 22–32)
Chloride: 103 mmol/L (ref 101–111)
Creatinine, Ser: 0.88 mg/dL (ref 0.44–1.00)
GFR calc Af Amer: >60 mL/min (ref >60)
GFR calc non Af Amer: >60 mL/min (ref >60)
GLUCOSE: 105 mg/dL — AB (ref 65–99)
Potassium: 3.1 mmol/L — ABNORMAL LOW (ref 3.5–5.1)
Sodium: 141 mmol/L (ref 135–145)

## 2016-03-07 MED ORDER — TRIAMTERENE-HCTZ 37.5-25 MG PO TABS: 1.0000 | ORAL_TABLET | Freq: Every day | ORAL | Status: DC

## 2016-03-07 MED ORDER — POTASSIUM CHLORIDE CRYS ER 10 MEQ PO TBCR
60.0000 meq | EXTENDED_RELEASE_TABLET | Freq: Once | ORAL | Status: AC
  Administered 2016-03-07: 60 meq via ORAL

## 2016-03-07 NOTE — Patient Instructions (Signed)
Thank you for your visit today Please change your hydrochlorothiazide blood pressure meds to Hydrochlorothiazide-triamterene daily Please follow up in 1 month

## 2016-03-07 NOTE — Assessment & Plan Note (Addendum)
K was 2.5 in ER K was 3.1 today. She is asymptomatic.  Given 60 meQ kdur Also switched hctz to maxzide   Follow up in 1 month

## 2016-03-07 NOTE — Progress Notes (Signed)
Patient ID: [name], female   DOB: Sep 23, 1935, 80 y.o.   MRN: [id number]    CC: follow up from ER for neck pain and hypertension HPI: [name] is a 80 y.o. woman with ER follow up for neck pain and hypertension. She has no neck pain and no complaints today  CT angio iNE R was neg for aortic dissection  Please see Problem List/A&P for the status of the patient's chronic medical problems   Past Medical History  Diagnosis Date  . Hypertension   . HLD (hyperlipidemia)   . CAD (coronary artery disease)     nonobstructive, myoview normal 03/2007, done by Dr. Daleen SquibbWall - EF = 75%  . Glaucoma     Review of Systems:  Negative except as per HPI  Physical Exam: Filed Vitals:   03/07/16 0916  BP: 150/68  Pulse: 68  Temp: 98 F (36.7 C)  TempSrc: Oral  Weight: 148 lb 1.6 oz (67.178 kg)  SpO2: 98%    General: A&O, in NAD, wearing black glasses CV: RRR, normal s1, s2, no m/r/g, Resp: equal and symmetric breath sounds, no wheezing or crackles  Abdomen: soft, nontender, nondistended, +BS Skin: warm, dry, intact,  Extremities: pulses intact b/l, no edema,   Assessment & Plan:   See encounters tab for problem based medical decision making. Patient discussed with Dr. Oswaldo DoneVincent

## 2016-03-07 NOTE — Progress Notes (Signed)
Internal Medicine Clinic Attending  Case discussed with Dr. Saraiya at the time of the visit.  We reviewed the resident's history and exam and pertinent patient test results.  I agree with the assessment, diagnosis, and plan of care documented in the resident's note.  

## 2016-03-07 NOTE — Assessment & Plan Note (Signed)
BP Readings from Last 3 Encounters:  03/07/16 150/68  02/14/16 136/61  05/30/15 129/52   BP 150/68. She has had chronic hypokalemia in the past. She is on atenolol, amlodipine, and hctz. Likely her hypokalemia is due to the potassium wasting effects of HCTZ.  Changed to Maxzide-25 which is K sparing  Follow up in 1 month

## 2016-03-18 ENCOUNTER — Other Ambulatory Visit: Payer: Self-pay | Admitting: *Deleted

## 2016-03-18 ENCOUNTER — Other Ambulatory Visit: Payer: Self-pay

## 2016-03-18 NOTE — Telephone Encounter (Signed)
Pt calls and wants pain med refilled, called pt back and she states she was given some pain medicine at [facility] and it helped her leg pain, she states she thought her daughter ask for some when she saw dr Johnny Bridge recently, I do not see in the note anything about pain med, I explained that she would need an appt to discuss this and sch appt 8/28 w/ dr Oswaldo Done, she was agreeable and I assured her that I would send dr Oswaldo Done a note

## 2016-03-18 NOTE — Telephone Encounter (Signed)
Requesting pain med to be filled.  

## 2016-03-19 NOTE — Telephone Encounter (Signed)
Yes, I agree. Narcotic refill not appropriate until I can evaluate her in a clinic visit. If the patient feels this is urgent you can add her onto the end of my clinic morning on Monday.

## 2016-03-20 NOTE — Telephone Encounter (Signed)
This was addressed in an other enc.

## 2016-03-21 NOTE — Telephone Encounter (Signed)
Pt aware.

## 2016-04-04 ENCOUNTER — Encounter: Payer: Self-pay | Admitting: Internal Medicine

## 2016-04-04 ENCOUNTER — Telehealth: Payer: Self-pay | Admitting: Student in an Organized Health Care Education/Training Program

## 2016-04-04 ENCOUNTER — Encounter (INDEPENDENT_AMBULATORY_CARE_PROVIDER_SITE_OTHER): Payer: Self-pay

## 2016-04-04 ENCOUNTER — Ambulatory Visit (INDEPENDENT_AMBULATORY_CARE_PROVIDER_SITE_OTHER): Payer: Medicare Other | Admitting: Internal Medicine

## 2016-04-04 VITALS — BP 160/70 | HR 87 | Temp 98.3°F | Ht 65.5 in | Wt 143.8 lb

## 2016-04-04 DIAGNOSIS — M79605 Pain in left leg: Secondary | ICD-10-CM | POA: Insufficient documentation

## 2016-04-04 DIAGNOSIS — M79662 Pain in left lower leg: Secondary | ICD-10-CM

## 2016-04-04 DIAGNOSIS — Z87891 Personal history of nicotine dependence: Secondary | ICD-10-CM | POA: Diagnosis not present

## 2016-04-04 DIAGNOSIS — I10 Essential (primary) hypertension: Secondary | ICD-10-CM | POA: Diagnosis not present

## 2016-04-04 DIAGNOSIS — E876 Hypokalemia: Secondary | ICD-10-CM | POA: Diagnosis not present

## 2016-04-04 MED ORDER — CYCLOBENZAPRINE HCL 5 MG PO TABS: 5.0000 mg | ORAL_TABLET | Freq: Three times a day (TID) | ORAL | 0 refills | Status: DC | PRN

## 2016-04-04 MED ORDER — NAPROXEN 500 MG PO TABS: 500.0000 mg | ORAL_TABLET | Freq: Two times a day (BID) | ORAL | 0 refills | Status: DC

## 2016-04-04 MED ORDER — KETOROLAC TROMETHAMINE 30 MG/ML IJ SOLN
30.0000 mg | Freq: Once | INTRAMUSCULAR | Status: AC
  Administered 2016-04-04: 30 mg via INTRAMUSCULAR

## 2016-04-04 MED ORDER — KETOROLAC TROMETHAMINE 30 MG/ML IJ SOLN: 30.0000 mg | Freq: Once | INTRAMUSCULAR | Status: DC

## 2016-04-04 NOTE — Assessment & Plan Note (Signed)
BP Readings from Last 3 Encounters:  04/04/16 (!) 160/70  03/07/16 (!) 150/68  02/14/16 136/61    Lab Results  Component Value Date   NA 141 03/07/2016   K 3.1 (L) 03/07/2016   CREATININE 0.88 03/07/2016    Assessment: Blood pressure control:  Uncontrolled Progress toward BP goal:   Deteriorated Comments: Likely elevated in setting of acute pain or due to recent medication changes. Patient reports compliance with triamterene-HCTZ 37.5-25 mg daily, atenolol 25 mg QD and amlodipine 5 mg daily.   Plan: Medications:  continue current medications Other plans: check BMET, repeat in one week

## 2016-04-04 NOTE — Telephone Encounter (Signed)
asking for refills  HYDROcodone-acetaminophen (NORCO/VICODIN) 5-325 MG tablet methocarbamol (ROBAXIN) 500 MG tablet

## 2016-04-04 NOTE — Patient Instructions (Signed)
FOR YOUR PAIN, TAKE NAPROXEN 500 MG TWICE A DAY WITH FOOD AND USE FLEXERIL 5 MG THREE TIMES A DAY AS NEEDED. WE WILL CHECK YOUR POTASSIUM AND CHECK AN ULTRASOUND OF YOUR LEG.   YOU WILL BE CALLED ABOUT THE LEG ULTRASOUND FOR TOMORROW.

## 2016-04-04 NOTE — Assessment & Plan Note (Signed)
Patient presents with a four day history of left lower extremity described as a tightness extending from her posterior ankle to posterior hamstring and with involvement of anterior leg as well. Pain was initially intermittent and now has become more constant. She denies any sharp pain, numbness or tingling. She is unsure what make the pain better or worse. She denies any weakness, swelling, erythema to her lower extremity. She denies any recent trauma or injury. She denies pain in her back. She hasn't tried anything for the pain. Currently, pain is a 9/10 and causes difficult ambulation. Pain is not better with elevation or rest. It is not worse on ambulation. Patient does have a history of spondylosis L4-5 and at L5-S1 but denies back pain or radiation of pain from low back. She denies a history of blood clots or family history of blood clots. Physical exam shows no lower extremity edema bilaterally, pedal pulses symmetric and intact bilaterally. + calf tenderness on left. No erythema or increased warmth. Strength is normal and symmetric bilaterally including flexion, extension, plantarflexion and dorsiflexion, no palpable cord, sensory intact to light touch bilaterally. Difficult ambulation.  Assessment: Left Leg Pain. Differentials include DVT, Myalgia 2/2 hypokalemia, radiculopathy, gastrocnemius tear or injury  Plan: -Duplex US of LLE -Toradol 30 mg IM once -Naproxen 500 mg BID WC -Flexeril 5 mg TID prn  -BMET -Follow up in one week

## 2016-04-04 NOTE — Telephone Encounter (Signed)
Tried to call pt, dr Oswaldo Donevincent had stated he could not order these until he evaluated pt, this will be done at her coming appt, got no answer at either ph#

## 2016-04-04 NOTE — Progress Notes (Signed)
    CC: leg pain  HPI: [name] is a 80 y.o. female wDoristine Counterith PMHx of hypokalemia, HLD, HTN, and OA who presents to the clinic for leg pain.   Patient presents with a four day history of left lower extremity described as a tightness extending from her posterior ankle to posterior hamstring and with involvement of anterior leg as well. Pain was initially intermittent and now has become more constant. She denies any sharp pain, numbness or tingling. She is unsure what make the pain better or worse. She denies any weakness, swelling, erythema to her lower extremity. She denies any recent trauma or injury. She denies pain in her back. She hasn't tried anything for the pain. Currently, pain is a 9/10 and causes difficult ambulation. Pain is not better with elevation or rest. It is not worse on ambulation. Patient does have a history of spondylosis L4-5 and at L5-S1 but denies back pain or radiation of pain from low back. She denies a history of blood clots or family history of blood clots.  Past Medical History:  Diagnosis Date  . CAD (coronary artery disease)    nonobstructive, myoview normal 03/2007, done by Dr. Daleen SquibbWall - EF = 75%  . Glaucoma   . HLD (hyperlipidemia)   . Hypertension     Review of Systems: A complete ROS was negative except as noted in HPI.   Physical Exam: Vitals:   04/04/16 1614  BP: (!) 160/70  Pulse: 87  Temp: 98.3 F (36.8 C)  TempSrc: Oral  SpO2: 97%  Weight: 143 lb 12.8 oz (65.2 kg)  Height: 5' 5.5" (1.664 m)   General: Vital signs reviewed.  Patient is elderly, in mild acute distress and cooperative with exam.  Cardiovascular: RRR Pulmonary/Chest: Clear to auscultation bilaterally, no wheezes, rales, or rhonchi. Abdominal: Soft, non-tender, non-distended, BS + Musculoskeletal: No pain on palpation of SI joints and lumbar spine. Extremities: No lower extremity edema bilaterally, pedal pulses symmetric and intact bilaterally. + calf tenderness on left. No  erythema or increased warmth. Strength is normal and symmetric bilaterally including flexion, extension, plantarflexion and dorsiflexion, no palpable cord, sensory intact to light touch bilaterally. Difficult ambulation Skin: Warm, dry and intact. No rashes or erythema. Psychiatric: Normal mood and affect. speech and behavior is normal. Cognition and memory are normal.   Assessment & Plan:  See encounters tab for problem based medical decision making. Patient discussed with Dr. Criselda PeachesMullen

## 2016-04-04 NOTE — Assessment & Plan Note (Signed)
Repeat BMET today for hypokalemia.

## 2016-04-05 ENCOUNTER — Ambulatory Visit (HOSPITAL_COMMUNITY)
Admission: RE | Admit: 2016-04-05 | Discharge: 2016-04-05 | Disposition: A | Discharge status: Home / Self Care | Payer: Medicare Other | Source: Ambulatory Visit | Attending: Oncology | Admitting: Oncology

## 2016-04-05 DIAGNOSIS — M79605 Pain in left leg: Secondary | ICD-10-CM | POA: Diagnosis not present

## 2016-04-05 LAB — BMP8+ANION GAP
Anion Gap: 21 mmol/L — ABNORMAL HIGH (ref 10.0–18.0)
BUN / CREAT RATIO: 12 (ref 12–28)
BUN: 13 mg/dL (ref 8–27)
CO2: 23 mmol/L (ref 18–29)
Calcium: 10 mg/dL (ref 8.7–10.3)
Chloride: 98 mmol/L (ref 96–106)
Creatinine, Ser: 1.07 mg/dL — ABNORMAL HIGH (ref 0.57–1.00)
GFR, EST AFRICAN AMERICAN: 57 mL/min/{1.73_m2} — AB (ref >59)
GFR, EST NON AFRICAN AMERICAN: 50 mL/min/{1.73_m2} — AB (ref >59)
GLUCOSE: 102 mg/dL — AB (ref 65–99)
POTASSIUM: 3.5 mmol/L (ref 3.5–5.2)
SODIUM: 142 mmol/L (ref 134–144)

## 2016-04-05 NOTE — Progress Notes (Signed)
*  PRELIMINARY RESULTS* Vascular Ultrasound Left lower extremity venous duplex has been completed.  Preliminary findings: No evidence of DVT or baker's cyst.   Called results to [employer].   Farrel DemarkJill Eunice, RDMS, RVT  04/05/2016, 5:00 PM

## 2016-04-05 NOTE — Telephone Encounter (Signed)
Saw dr burns in clinic

## 2016-04-08 ENCOUNTER — Telehealth: Payer: Self-pay

## 2016-04-08 DIAGNOSIS — M79605 Pain in left leg: Secondary | ICD-10-CM

## 2016-04-08 MED ORDER — TRAMADOL HCL 50 MG PO TABS: 50.0000 mg | ORAL_TABLET | Freq: Three times a day (TID) | ORAL | 0 refills | Status: DC | PRN

## 2016-04-08 NOTE — Telephone Encounter (Signed)
Patient continues to have ongoing severe left lower extremity pain, primarily in calf area that is worst with ambulation and better with rest. Vascular US was negative for DVT and BKoreaaker's Cyst. Potassium was normal. Presentation and history are not classic for PAD and patient had 2+ pedal pulses on exam. However, given history of severe aortic calcification, ongoing pain with ambulation, I do have a clinical suspicion for PAD. I have ordered ABIs of the left lower extremity and prescribed tramadol 50 mg Q8H prn for pain. Patient has a follow up appointment in one week with her PCP.   Karlene LinemanAlexa Jahari Billy, DO PGY-3 Internal Medicine Resident Pager # [phone] 04/08/2016 11:32 AM

## 2016-04-08 NOTE — Telephone Encounter (Signed)
Requesting a stronger pain med. Please call back.

## 2016-04-08 NOTE — Telephone Encounter (Signed)
Rx for Tramadol phoned into pharmacy.  ABIs (left) scheduled for tomorrow at 4pm here @ [facility]-pt to arrive 15 mins early to register in admitting.  Phone call complete.Kingsley SpittleGoldston, Darlene Cassady8/14/20172:57 PM

## 2016-04-09 ENCOUNTER — Ambulatory Visit (HOSPITAL_COMMUNITY)
Admission: RE | Admit: 2016-04-09 | Discharge: 2016-04-09 | Disposition: A | Discharge status: Home / Self Care | Payer: Medicare Other | Source: Ambulatory Visit | Attending: Internal Medicine | Admitting: Internal Medicine

## 2016-04-09 DIAGNOSIS — M79605 Pain in left leg: Secondary | ICD-10-CM | POA: Insufficient documentation

## 2016-04-09 NOTE — Progress Notes (Signed)
VASCULAR LAB PRELIMINARY  ARTERIAL  ABI completed:    RIGHT    LEFT    PRESSURE WAVEFORM  PRESSURE WAVEFORM  BRACHIAL 145 Triphasic BRACHIAL 146 Triphasic  DP 138 Triphasic DP 145 Triphasic  PT 137 Triphasic PT 155 Triphasic    RIGHT LEFT  ABI 0.95 1.06   ABIs and Doppler waveforms are within normal limits bilaterally at rest. Patient is partially blind and was not able to perform the exercise portion of the study.  Evanna Washinton, RVS 04/09/2016, 5:09 PM

## 2016-04-11 ENCOUNTER — Other Ambulatory Visit: Payer: Self-pay | Admitting: Internal Medicine

## 2016-04-11 DIAGNOSIS — M79605 Pain in left leg: Secondary | ICD-10-CM

## 2016-04-15 ENCOUNTER — Ambulatory Visit (INDEPENDENT_AMBULATORY_CARE_PROVIDER_SITE_OTHER): Payer: Medicare Other | Admitting: Student in an Organized Health Care Education/Training Program

## 2016-04-15 VITALS — BP 120/60 | HR 78 | Temp 98.3°F | Ht 62.0 in | Wt 147.3 lb

## 2016-04-15 DIAGNOSIS — R296 Repeated falls: Secondary | ICD-10-CM

## 2016-04-15 DIAGNOSIS — Z23 Encounter for immunization: Secondary | ICD-10-CM

## 2016-04-15 DIAGNOSIS — R319 Hematuria, unspecified: Secondary | ICD-10-CM | POA: Diagnosis not present

## 2016-04-15 DIAGNOSIS — N390 Urinary tract infection, site not specified: Secondary | ICD-10-CM

## 2016-04-15 DIAGNOSIS — M79605 Pain in left leg: Secondary | ICD-10-CM

## 2016-04-15 DIAGNOSIS — Z9181 History of falling: Secondary | ICD-10-CM | POA: Diagnosis not present

## 2016-04-15 DIAGNOSIS — B9689 Other specified bacterial agents as the cause of diseases classified elsewhere: Secondary | ICD-10-CM

## 2016-04-15 DIAGNOSIS — Z87891 Personal history of nicotine dependence: Secondary | ICD-10-CM | POA: Diagnosis not present

## 2016-04-15 DIAGNOSIS — N3001 Acute cystitis with hematuria: Secondary | ICD-10-CM

## 2016-04-15 DIAGNOSIS — R3915 Urgency of urination: Secondary | ICD-10-CM | POA: Diagnosis not present

## 2016-04-15 DIAGNOSIS — I700 Atherosclerosis of aorta: Secondary | ICD-10-CM | POA: Insufficient documentation

## 2016-04-15 DIAGNOSIS — I10 Essential (primary) hypertension: Secondary | ICD-10-CM

## 2016-04-15 HISTORY — DX: Repeated falls: R29.6

## 2016-04-15 NOTE — Assessment & Plan Note (Addendum)
Sensation of urinary urgency for several days. No dysuria. Plan to check urinalysis to rule out urinary tract infection today.'  ADDENDUM: urinalysis returned with pyuria, bacteruria, and hematuria consistent with UTI. No previous micro in our system for her. I have added on a urine culture and prescribed bactrim DS bid for three days.

## 2016-04-15 NOTE — Assessment & Plan Note (Signed)
Blood pressure was elevated initially, on recheck it was much more appropriate. I med rec we noticed that she was taking 50 mg of hydrochlorothiazide because of confusion. We clarified and she will take just the combination triamterene-HCTZ, and amlodipine.

## 2016-04-15 NOTE — Patient Instructions (Signed)
1. Stop taking Cyclobenzaprine, Hydrochlorathiazide, and Atorvastatin  2. I have prescribed you a rolling walker to help prevent a fall at home.   3. I hope stopping the atorvastatin will help your leg pain. Call me if it gets worse or does not resolve within a few weeks.   4. We will check your urine today to make sure you do not have a urine infection.

## 2016-04-15 NOTE — Progress Notes (Signed)
Internal Medicine Clinic Attending  Case discussed with Dr. Burns soon after the resident saw the patient.  We reviewed the resident's history and exam and pertinent patient test results.  I agree with the assessment, diagnosis, and plan of care documented in the resident's note. 

## 2016-04-15 NOTE — Assessment & Plan Note (Signed)
Elderly woman who is very afraid of falling. Feels a little unsteady on her feet. No recent falls. Difficulty with sight due to glaucoma. DEXA screened appropriate. We are reducing centrally acting medications. I prescribed a rolling walker through DME.

## 2016-04-15 NOTE — Assessment & Plan Note (Signed)
Seen on CT scan in 2017. She's been treated for a long time with atorvastatin. Now she's having this myalgia and her left leg. We'll do a trial of stopping the statin to see if that will improve her leg cramps. Given her advanced age to little unclear if her statin is giving her any more benefit. If we find that the statin is unrelated to the pain, we can consider reinitiating it in the future.

## 2016-04-15 NOTE — Progress Notes (Signed)
Assessment and Plan:  See Encounters tab for problem-based medical decision making.   __________________________________________________________  HPI:  80 year old woman comes in today for follow-up of hypertension. She reports good compliance with her current medications. Her HCTZ was recently changed to a combination triamterene-HCTZ to improve her hypokalemia. On reviewing her pill bottles it seems that she still taking both formulations of HCTZ. She continues to have a left anterior thigh pain, describes it as a moderate intensity throbbing type pain. It radiates down her leg. Denies any pain in her groin or her lower back. She has a little bit of pain in her knee. It's present at rest and with exertion. She has had this pain before, reminds her of pain when she was a teenage girl, she said she had a workup with the doctors back then which also did not show the reason for it. She's been using tramadol with some help, she thinks naproxen is more helpful. She thinks Flexeril is not helpful. Otherwise doing well at home. Lives with her granddaughter. [place] daughter [place] in [place] and checks on her frequently. She is afraid of falling in her home. Denies any recent falls, just feels unsteady on her feet. Also endorses some urinary urgency over the last few days, denies burning with urination. No fevers or chills. Eating and drinking well.  __________________________________________________________  Problem List: Patient Active Problem List   Diagnosis Date Noted  . At high risk for falls 04/15/2016    Priority: High  . Calcification of aorta (HCC) 04/15/2016    Priority: Medium  . Left leg pain 04/04/2016    Priority: Medium  . GLAUCOMA NOS 04/22/2007    Priority: Medium  . Essential hypertension 04/22/2007    Priority: Medium  . Urinary urgency 04/15/2016    Priority: Low  . Health care maintenance 09/21/2014    Priority: Low  . Osteoarthritis - shoulders, small fingers, knees, elbows  09/20/2014    Priority: Low  . 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  Ext: Warm, no edema, normal joints, minimal tenderness of the right thigh with palpation Skin: No atypical appearing moles. No rashes

## 2016-04-15 NOTE — Assessment & Plan Note (Signed)
Strange presentation of a sharp pain on her left anterior thigh radiating below the knee. No groin pain, no back pain. This is been evaluated with vascular ultrasound that ruled out deep vein thrombosis and normal ankle brachial indices. I doubt this is a vascular-type pain given those normal studies. Without other localizing back pain or groin pain I also doubt osteoarthritic cause. I wonder if she's having myalgias related to her statin therapy. Plan to hold atorvastatin for now. We could consider screening for vitamin D deficiency at her next lab draw. For now to try this therapeutic discontinuation of the statin to see if it solves her muscle pain.

## 2016-04-16 ENCOUNTER — Other Ambulatory Visit: Payer: Self-pay | Admitting: Student in an Organized Health Care Education/Training Program

## 2016-04-16 LAB — URINALYSIS, COMPLETE
BILIRUBIN UA: NEGATIVE
GLUCOSE, UA: NEGATIVE
KETONES UA: NEGATIVE
Nitrite, UA: POSITIVE — AB
PROTEIN UA: NEGATIVE
SPEC GRAV UA: 1.016 (ref 1.005–1.030)
UUROB: 0.2 mg/dL (ref 0.2–1.0)
pH, UA: 5 (ref 5.0–7.5)

## 2016-04-16 LAB — MICROSCOPIC EXAMINATION: Casts: NONE SEEN /lpf

## 2016-04-16 MED ORDER — SULFAMETHOXAZOLE-TRIMETHOPRIM 800-160 MG PO TABS: 1.0000 | ORAL_TABLET | Freq: Two times a day (BID) | ORAL | 0 refills | Status: AC

## 2016-04-16 NOTE — Addendum Note (Signed)
Addended by: Erlinda HongVINCENT, Gurkaran Rahm T on: 04/16/2016 08:45 AM   Modules accepted: Orders

## 2016-04-18 LAB — URINE CULTURE

## 2016-04-18 LAB — SPECIMEN STATUS REPORT

## 2016-04-22 ENCOUNTER — Encounter: Payer: Medicare Other | Admitting: Student in an Organized Health Care Education/Training Program

## 2016-04-30 ENCOUNTER — Other Ambulatory Visit: Payer: Self-pay | Admitting: Student in an Organized Health Care Education/Training Program

## 2016-04-30 DIAGNOSIS — M79605 Pain in left leg: Secondary | ICD-10-CM

## 2016-04-30 NOTE — Telephone Encounter (Signed)
would like to talk to nurse

## 2016-04-30 NOTE — Telephone Encounter (Signed)
Pt states she still having thigh pain. Requesting additional tests to be done.. Also asking about rolling walker-asked [place], [facility]

## 2016-05-01 MED ORDER — TRAMADOL HCL 50 MG PO TABS: 50.0000 mg | ORAL_TABLET | Freq: Three times a day (TID) | ORAL | 0 refills | Status: DC | PRN

## 2016-05-01 NOTE — Telephone Encounter (Signed)
Tramadol rx called to [facility]. 

## 2016-05-06 ENCOUNTER — Emergency Department (HOSPITAL_COMMUNITY)
Admission: EM | Admit: 2016-05-06 | Discharge: 2016-05-06 | Disposition: A | Discharge status: Home / Self Care | Payer: Medicare Other | Attending: Emergency Medicine | Admitting: Emergency Medicine

## 2016-05-06 ENCOUNTER — Encounter (HOSPITAL_COMMUNITY): Payer: Self-pay | Admitting: *Deleted

## 2016-05-06 DIAGNOSIS — M542 Cervicalgia: Secondary | ICD-10-CM | POA: Diagnosis not present

## 2016-05-06 DIAGNOSIS — R52 Pain, unspecified: Secondary | ICD-10-CM | POA: Diagnosis not present

## 2016-05-06 DIAGNOSIS — Z791 Long term (current) use of non-steroidal anti-inflammatories (NSAID): Secondary | ICD-10-CM | POA: Diagnosis not present

## 2016-05-06 DIAGNOSIS — I10 Essential (primary) hypertension: Secondary | ICD-10-CM | POA: Insufficient documentation

## 2016-05-06 DIAGNOSIS — I2510 Atherosclerotic heart disease of native coronary artery without angina pectoris: Secondary | ICD-10-CM | POA: Diagnosis not present

## 2016-05-06 DIAGNOSIS — M62838 Other muscle spasm: Secondary | ICD-10-CM | POA: Diagnosis not present

## 2016-05-06 DIAGNOSIS — Z79899 Other long term (current) drug therapy: Secondary | ICD-10-CM | POA: Insufficient documentation

## 2016-05-06 DIAGNOSIS — Z87891 Personal history of nicotine dependence: Secondary | ICD-10-CM | POA: Diagnosis not present

## 2016-05-06 MED ORDER — METHOCARBAMOL 500 MG PO TABS
500.0000 mg | ORAL_TABLET | Freq: Once | ORAL | Status: AC
  Administered 2016-05-06: 500 mg via ORAL
  Filled 2016-05-06: qty 1

## 2016-05-06 MED ORDER — HYDROCODONE-ACETAMINOPHEN 5-325 MG PO TABS
1.0000 | ORAL_TABLET | Freq: Once | ORAL | Status: AC
  Administered 2016-05-06: 1 via ORAL
  Filled 2016-05-06: qty 1

## 2016-05-06 MED ORDER — HYDROCODONE-ACETAMINOPHEN 5-325 MG PO TABS: 1.0000 | ORAL_TABLET | ORAL | 0 refills | Status: DC | PRN

## 2016-05-06 MED ORDER — METHOCARBAMOL 500 MG PO TABS: 500.0000 mg | ORAL_TABLET | Freq: Two times a day (BID) | ORAL | 0 refills | Status: DC

## 2016-05-06 NOTE — ED Notes (Signed)
Awaiting evaluation by provider.  

## 2016-05-06 NOTE — ED Triage Notes (Signed)
Per EMS, pt complains of left sided neck pain. Pt has hx of neck pain since June, says pain is worsening and she is out of medication.

## 2016-05-06 NOTE — ED Triage Notes (Signed)
Family is concerned about the delay in their mother being seen. They stated that she came by ambulance 3 hours ago "and should have been seen immediately". Family was told that we would take her back to be evaluated as soon as possible.Pt was awake and aware of delay.

## 2016-05-06 NOTE — ED Provider Notes (Signed)
[facility]-EMERGENCY DEPT Provider Note   CSN: [id number] Arrival date & time: 05/06/16  1522     History   Chief Complaint Chief Complaint  Patient presents with  . Neck Pain    HPI [name] is a 80 y.o. female.  HPI  Pt preseting with c/o ongoing neck pain.  She was seen in the ED several months ago and given rx for hydrocodone as well as robaxin.  She states these meds worked well- she was diagnosed with a muscle spasm.  When she followed up with her doctor they did not prescribe more hydrocodone and tried her on naproxen. She states this did not help at all.  No weakness of arms, no arm swelling.  Symptoms have been ongoing for the past several months.  She also lost her bottle of robaxin and now pain is worsening.  No new injuries.  No change in vision or speech.  There are no other associated systemic symptoms, there are no other alleviating or modifying factors.   Past Medical History:  Diagnosis Date  . CAD (coronary artery disease)    nonobstructive, myoview normal 03/2007, done by Dr. Daleen SquibbWall - EF = 75%  . Glaucoma   . HLD (hyperlipidemia)   . Hypertension     Patient Active Problem List   Diagnosis Date Noted  . Calcification of aorta (HCC) 04/15/2016  . UTI (urinary tract infection) 04/15/2016  . At high risk for falls 04/15/2016  . Left leg pain 04/04/2016  . Health care maintenance 09/21/2014  . Osteoarthritis - shoulders, small fingers, knees, elbows 09/20/2014  . 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 GLAUCOMA NOS 04/22/2007  . Essential hypertension 04/22/2007    History reviewed. No pertinent surgical history.  OB History    No data available       Home Medications    Prior to Admission medications   Medication Sig Start Date End Date Taking? Authorizing Provider  acetaminophen (TYLENOL) 325 MG tablet Take 650 mg by mouth every 6 (six) hours as needed for mild pain, moderate pain or headache. Reported on  03/07/2016    Historical Provider, MD  amLODipine (NORVASC) 5 MG tablet TAKE 1 TABLET DAILY 10/16/15   Tyson Aliasuncan Thomas Vincent, MD  atenolol (TENORMIN) 25 MG tablet Take 1 tablet (25 mg total) by mouth daily. 11/30/15   Doneen PoissonLawrence Klima, MD  bimatoprost (LUMIGAN) 0.03 % ophthalmic drops Place 1 drop into the left eye at bedtime.      Historical Provider, MD  brimonidine (ALPHAGAN P) 0.1 % SOLN 1 drop in left eye twice a day    Historical Provider, MD  HYDROcodone-acetaminophen (NORCO/VICODIN) 5-325 MG tablet Take 1 tablet by mouth every 4 (four) hours as needed. 05/06/16   Jerelyn ScottMartha Linker, MD  methocarbamol (ROBAXIN) 500 MG tablet Take 1 tablet (500 mg total) by mouth 2 (two) times daily. 05/06/16   Jerelyn ScottMartha Linker, MD  naproxen (NAPROSYN) 500 MG tablet Take 1 tablet (500 mg total) by mouth 2 (two) times daily with a meal. 04/04/16   Alexa Lucrezia Starch Burns, MD  traMADol (ULTRAM) 50 MG tablet Take 1 tablet (50 mg total) by mouth every 8 (eight) hours as needed. 05/01/16   Tyson Aliasuncan Thomas Vincent, MD  triamterene-hydrochlorothiazide (MAXZIDE-25) 37.5-25 MG tablet Take 1 tablet by mouth daily. 03/07/16 03/07/17  Deneise LeverParth Saraiya, MD    Family History Family History  Problem Relation Age of Onset  . Stroke Neg Hx   . Cancer Neg Hx  Social History Social History  Substance Use Topics  . Smoking status: Former Smoker    Types: Cigarettes  . Smokeless tobacco: Never Used  . Alcohol use No     Allergies   Aspirin and Codeine   Review of Systems Review of Systems ROS reviewed and all otherwise negative except for mentioned in HPI  Physical Exam Updated Vital Signs BP 131/69 (BP Location: Right Arm)   Pulse 94   Temp 98.2 F (36.8 C) (Oral)   Resp 18   Ht 5\' 5"  (1.651 m)   Wt 66.7 kg   SpO2 95%   BMI 24.46 kg/m  Vitals reviewed Physical Exam Physical Examination: General appearance - alert, well appearing, and in no distress Mental status - alert, oriented to person, place, and time Eyes - no  conjunctival injection no scleral icterus Mouth - mucous membranes moist, pharynx normal without lesions Neck - supple, no significant adenopathy, no midline tenderness, ttp over left SCM distribution, pain with movement of neck,  Chest - clear to auscultation, no wheezes, rales or rhonchi, symmetric air entry Heart - normal rate, regular rhythm, normal S1, S2, no murmurs, rubs, clicks or gallops Neurological - alert, oriented, normal speech strength 5/5 in extremities x 4, sensation intactDictation #1 [id number]  [id number]  Extremities - peripheral pulses normal, no pedal edema, no clubbing or cyanosis Skin - normal coloration and turgor, no rashes  ED Treatments / Results  Labs (all labs ordered are listed, but only abnormal results are displayed) Labs Reviewed - No data to display  EKG  EKG Interpretation None       Radiology No results found.  Procedures Procedures (including critical care time)  Medications Ordered in ED Medications  HYDROcodone-acetaminophen (NORCO/VICODIN) 5-325 MG per tablet 1 tablet (1 tablet Oral Given 05/06/16 2037)  methocarbamol (ROBAXIN) tablet 500 mg (500 mg Oral Given 05/06/16 2037)     Initial Impression / Assessment and Plan / ED Course  I have reviewed the triage vital signs and the nursing notes.  Pertinent labs & imaging results that were available during my care of the patient were reviewed by me and considered in my medical decision making (see chart for details).  Clinical Course    Pt presenting with c/o left sided neck pain- pain has been ongoing for several weeks, but worse now that she lost her robaxin and finished hydrocodone.  Pt has no neuro deficits.  At first visit had negative CT neck for dissection.  I do not feel imaging is warranted at this time.  Pt given small rx for norco and also robaxin.  Pt to f/u with PMD.  Discharged with strict return precautions.  Pt agreeable with plan.  Final Clinical Impressions(s) /  ED Diagnoses   Final diagnoses:  Muscle spasms of neck    New Prescriptions Discharge Medication List as of 05/06/2016  9:00 PM    START taking these medications   Details  HYDROcodone-acetaminophen (NORCO/VICODIN) 5-325 MG tablet Take 1 tablet by mouth every 4 (four) hours as needed., Starting Mon 05/06/2016, Print    methocarbamol (ROBAXIN) 500 MG tablet Take 1 tablet (500 mg total) by mouth 2 (two) times daily., Starting Mon 05/06/2016, Print      ml   Jerelyn Scott, MD 05/07/16 2229

## 2016-05-06 NOTE — Discharge Instructions (Signed)
Return to the ED with any concerns including weakness of arms, swelling of arms, or any other alarming symptoms

## 2016-05-11 ENCOUNTER — Other Ambulatory Visit: Payer: Self-pay | Admitting: Student in an Organized Health Care Education/Training Program

## 2016-05-27 ENCOUNTER — Other Ambulatory Visit: Payer: Self-pay | Admitting: Student in an Organized Health Care Education/Training Program

## 2016-05-27 DIAGNOSIS — N3001 Acute cystitis with hematuria: Secondary | ICD-10-CM

## 2016-05-27 DIAGNOSIS — R3915 Urgency of urination: Secondary | ICD-10-CM

## 2016-05-28 NOTE — Telephone Encounter (Signed)
It was a computer request from the pharmacy. I have tried to call pt and have left a message for a rtc

## 2016-05-28 NOTE — Telephone Encounter (Signed)
What is the reason for this bactrim request? I don't see a phone note so not sure if this came from the pharmacy or the patient. If she is having return of urinary symptoms I would prefer to have her come in for a urinalysis and culture before prescribing another round of antibiotic.

## 2016-06-03 ENCOUNTER — Encounter: Payer: Self-pay | Admitting: Student in an Organized Health Care Education/Training Program

## 2016-06-03 ENCOUNTER — Encounter: Payer: Medicare Other | Admitting: Student in an Organized Health Care Education/Training Program

## 2016-06-05 ENCOUNTER — Ambulatory Visit (INDEPENDENT_AMBULATORY_CARE_PROVIDER_SITE_OTHER): Payer: Medicare Other | Admitting: Internal Medicine

## 2016-06-05 VITALS — BP 134/55 | HR 66 | Temp 97.7°F | Wt 148.0 lb

## 2016-06-05 DIAGNOSIS — Z09 Encounter for follow-up examination after completed treatment for conditions other than malignant neoplasm: Secondary | ICD-10-CM | POA: Diagnosis not present

## 2016-06-05 DIAGNOSIS — Z87891 Personal history of nicotine dependence: Secondary | ICD-10-CM

## 2016-06-05 DIAGNOSIS — Z79899 Other long term (current) drug therapy: Secondary | ICD-10-CM | POA: Diagnosis not present

## 2016-06-05 DIAGNOSIS — I10 Essential (primary) hypertension: Secondary | ICD-10-CM

## 2016-06-05 DIAGNOSIS — Z87440 Personal history of urinary (tract) infections: Secondary | ICD-10-CM | POA: Diagnosis not present

## 2016-06-05 DIAGNOSIS — N3001 Acute cystitis with hematuria: Secondary | ICD-10-CM

## 2016-06-05 LAB — POCT URINALYSIS DIPSTICK
Bilirubin, UA: NEGATIVE
GLUCOSE UA: NEGATIVE
Ketones, UA: NEGATIVE
Leukocytes, UA: NEGATIVE
NITRITE UA: NEGATIVE
PH UA: 5
Protein, UA: NEGATIVE
RBC UA: NEGATIVE
SPEC GRAV UA: 1.01
UROBILINOGEN UA: 0.2

## 2016-06-05 NOTE — Patient Instructions (Addendum)
[name] it was nice meeting you today.  -Continue taking your blood pressure medicines as instructed.  -You have an appointment with Dr. Oswaldo DoneVincent on Novemeber 6th at 9:15 am.

## 2016-06-06 NOTE — Progress Notes (Signed)
Medicine attending: Medical history, presenting problems, physical findings, and medications, reviewed with resident physician Dr Vasu Rathore on the day of the patient visit and I concur with her evaluation and management plan. 

## 2016-06-06 NOTE — Progress Notes (Signed)
   CC: Patient is here for a follow-up of a recent UTI.  HPI:  [name] is a 80 y.o. female with a past medical history of conditions listed below presenting to the clinic for a follow-up of a recent UTI. Hypertension was also discussed during this visit. Please see problem based charting for the status of the patient's current and chronic medical conditions.   Past Medical History:  Diagnosis Date  . 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 Left eye sees shadows and is bothered by bright lights. She wears specialty sunglasses which help her with sensitivity.   [name]. CAD (coronary artery disease)    nonobstructive, myoview normal 03/2007, done by Dr. Daleen SquibbWall - EF = 75%  . Glaucoma   . HLD (hyperlipidemia)   . Hypertension     Review of Systems:  Pertinent positives mentioned in HPI. Remainder of all ROS negative.   Physical Exam:  Vitals:   06/05/16 1056 06/05/16 1116  BP: (!) 154/54 (!) 134/55  Pulse: 61 66  Temp: 97.7 F (36.5 C)   TempSrc: Oral   SpO2: 97%   Weight: 148 lb (67.1 kg)    Physical Exam  Constitutional: She is oriented to person, place, and time. She appears well-developed and well-nourished. No distress.  HENT:  Head: Normocephalic and atraumatic.  Mouth/Throat: Oropharynx is clear and moist.  Neck: Neck supple. No tracheal deviation present.  Cardiovascular: Normal rate, regular rhythm and intact distal pulses.   Pulmonary/Chest: Effort normal and breath sounds normal. No respiratory distress.  Abdominal: Soft. Bowel sounds are normal. She exhibits no distension. There is no tenderness. There is no guarding.  Musculoskeletal: Normal range of motion. She exhibits no edema or deformity.  Neurological: She is alert and oriented to person, place, and time.  Skin: Skin is warm and dry.    Assessment & Plan:   See Encounters Tab for  problem based charting.  Patient discussed with Dr. Cyndie ChimeGranfortuna

## 2016-06-06 NOTE — Assessment & Plan Note (Signed)
Assessment Blood pressure elevated 154/54 at this visit; was below goal during previous visit. Patient is currently taking Norvasc 5 mg daily, atenolol 25 mg daily, and triamterene-hydrochlorothiazide 37.5-25 mg daily. States she forgot to take her blood pressure medications this morning.  Plan -Advised patient to take her medications regularly -Return to clinic on November 6 (appointment with PCP)

## 2016-06-06 NOTE — Assessment & Plan Note (Signed)
History of present illness Patient was diagnosed with a UTI during previous visit on 04/16/2016 and treated with a 3 day course of Bactrim DS. At present, she denies having any dysuria, frequency, urgency, or hematuria.  Assessment Resolved urinary tract infection. Dipstick at this visit negative for nitrite, leukocytes, and blood.

## 2016-06-20 ENCOUNTER — Ambulatory Visit (INDEPENDENT_AMBULATORY_CARE_PROVIDER_SITE_OTHER): Payer: Medicare Other | Admitting: Internal Medicine

## 2016-06-20 VITALS — BP 157/61 | HR 65 | Temp 97.5°F | Wt 150.1 lb

## 2016-06-20 DIAGNOSIS — I10 Essential (primary) hypertension: Secondary | ICD-10-CM

## 2016-06-20 DIAGNOSIS — Z87891 Personal history of nicotine dependence: Secondary | ICD-10-CM | POA: Diagnosis not present

## 2016-06-20 DIAGNOSIS — R350 Frequency of micturition: Secondary | ICD-10-CM | POA: Diagnosis not present

## 2016-06-20 DIAGNOSIS — Z79899 Other long term (current) drug therapy: Secondary | ICD-10-CM | POA: Diagnosis not present

## 2016-06-20 DIAGNOSIS — N393 Stress incontinence (female) (male): Secondary | ICD-10-CM | POA: Insufficient documentation

## 2016-06-20 LAB — POCT URINALYSIS DIPSTICK
Bilirubin, UA: NEGATIVE
GLUCOSE UA: NEGATIVE
Ketones, UA: NEGATIVE
LEUKOCYTES UA: NEGATIVE
NITRITE UA: NEGATIVE
Protein, UA: NEGATIVE
RBC UA: NEGATIVE
Spec Grav, UA: 1.015
UROBILINOGEN UA: 0.2
pH, UA: 7

## 2016-06-20 MED ORDER — POTASSIUM CHLORIDE CRYS ER 20 MEQ PO TBCR: 20.0000 meq | EXTENDED_RELEASE_TABLET | Freq: Every day | ORAL | 0 refills | Status: DC

## 2016-06-20 MED ORDER — HYDROCHLOROTHIAZIDE 25 MG PO TABS: 25.0000 mg | ORAL_TABLET | Freq: Every day | ORAL | 2 refills | Status: DC

## 2016-06-20 NOTE — Progress Notes (Signed)
   CC: urinary frequency  HPI:  [name] is a 80 y.o. female with PMH as listed below who presents for evaluation of urinary frequency. She is accompanied by her daughter who provides additional history.  Patient reports 2-3 months of urinary frequency that has increased to the point of going to the bathroom every 10-20 minutes the last couple days. She reports having full stream with lighter colored urine. No hematuria, dysuria, burning on urination, fevers, chills, or flank pain. She drinks 2 glasses of water a day and 1 cup of tea in morning and at night. She has been drinking cranberry juice recently due to concern for UTI. She did have a E. Coli UTI 2 months ago treated appropriately with Bactrim and relief of her UTI symptoms. Patient returned to clinic 2 weeks ago for evaluation for UTI. Urine dipstick was negative. Urine dipstick this visit is also negative.   She does have HTN for which she takes Amlodipine 5 mg daily, Atenolol 25 mg daily, and Maxzide 37.5-25 mg daily (changed from HCTZ 3 months ago due to hypokalemia).  Past Medical History:  Diagnosis Date  . 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 Left eye sees shadows and is bothered by bright lights. She wears specialty sunglasses which help her with sensitivity.   [name]. CAD (coronary artery disease)    nonobstructive, myoview normal 03/2007, done by Dr. Daleen SquibbWall - EF = 75%  . Glaucoma   . HLD (hyperlipidemia)   . Hypertension     Review of Systems:   Review of Systems  Eyes:       Legally blind  Cardiovascular: Negative for leg swelling.  Genitourinary: Positive for frequency. Negative for dysuria, flank pain, hematuria and urgency.  Musculoskeletal: Negative for falls.  Neurological: Negative for dizziness and loss of consciousness.     Physical Exam:  Vitals:   06/20/16 1622  BP: (!)  157/61  Pulse: 65  Temp: 97.5 F (36.4 C)  TempSrc: Oral  SpO2: 99%  Weight: 150 lb 1.6 oz (68.1 kg)   Physical Exam  Constitutional: She is oriented to person, place, and time. No distress.  Cardiovascular: Normal rate and regular rhythm.   Pulmonary/Chest: Effort normal. No respiratory distress. She has no wheezes. She has no rales.  Abdominal: Soft. She exhibits no distension. There is no tenderness.  Musculoskeletal: She exhibits no edema.  Neurological: She is alert and oriented to person, place, and time.     Assessment & Plan:   See Encounters Tab for problem based charting.  Patient discussed with Dr. Josem KaufmannKlima

## 2016-06-20 NOTE — Patient Instructions (Signed)
It was a pleasure to meet you Ms. Zayas.  I do not think you have a UTI.  Your urinary frequency may be from your diuretics.  We will change the Maxzide back to Hydrochlorothiazide 25 mg daily.  We will add Potassium supplement to prevent low potassium levels.  Please follow up with us in 2 weeks for a blood pressure check and labwork to check the potassium levels.  Please call us if you have any issues.   All the best, Dr. Allena KatzPatel

## 2016-06-21 NOTE — Assessment & Plan Note (Signed)
Patient reports 2-3 months of urinary frequency that has increased to the point of going to the bathroom every 10-20 minutes the last couple days. She reports having full stream with lighter colored urine. No hematuria, dysuria, burning on urination, fevers, chills, or flank pain. She drinks 2 glasses of water a day and 1 cup of tea in morning and at night. She has been drinking cranberry juice recently due to concern for UTI. She did have a E. Coli UTI 2 months ago treated appropriately with Bactrim and relief of her UTI symptoms. Patient returned to clinic 2 weeks ago for evaluation for UTI. Urine dipstick was negative. Urine dipstick this visit is also negative.   She does have HTN for which she takes Amlodipine 5 mg daily, Atenolol 25 mg daily, and Maxzide 37.5-25 mg daily (changed from HCTZ 3 months ago due to hypokalemia).  Very low suspicion for UTI given negative dipstick and absence of other urinary symptoms. She is not reporting excess fluid intake. Symptoms likely from diuretics and increased urinary frequency seems temporally related to change in BP med to Maxzide from HCTZ. Will adjust medications to hopefully reduce frequency and follow up for BP check and assess for hypokalemia. -D/c Maxzide -Start HCTZ 25 mg daily -Start KDur 20 mEq for potassium supplementation -Continue other meds

## 2016-06-21 NOTE — Assessment & Plan Note (Signed)
See Urinary frequency on problem list for further details. Will adjust medications to hopefully reduce frequency and follow up for BP check and assess for hypokalemia. -D/c Maxzide -Start HCTZ 25 mg daily -Start KDur 20 mEq for potassium supplementation -Continue Atenolol and Amlodipine -f/u for BP and potassium check

## 2016-06-23 NOTE — Progress Notes (Signed)
Case discussed with Dr. Patel at the time of the visit.  We reviewed the resident's history and exam and pertinent patient test results.  I agree with the assessment, diagnosis, and plan of care documented in the resident's note. 

## 2016-07-01 ENCOUNTER — Encounter: Payer: Medicare Other | Admitting: Student in an Organized Health Care Education/Training Program

## 2016-07-08 ENCOUNTER — Ambulatory Visit (INDEPENDENT_AMBULATORY_CARE_PROVIDER_SITE_OTHER): Payer: Medicare Other | Admitting: Student in an Organized Health Care Education/Training Program

## 2016-07-08 ENCOUNTER — Encounter: Payer: Self-pay | Admitting: Student in an Organized Health Care Education/Training Program

## 2016-07-08 ENCOUNTER — Encounter: Payer: Medicare Other | Admitting: Student in an Organized Health Care Education/Training Program

## 2016-07-08 DIAGNOSIS — I10 Essential (primary) hypertension: Secondary | ICD-10-CM | POA: Diagnosis not present

## 2016-07-08 DIAGNOSIS — Z87891 Personal history of nicotine dependence: Secondary | ICD-10-CM | POA: Diagnosis not present

## 2016-07-08 DIAGNOSIS — Z9181 History of falling: Secondary | ICD-10-CM | POA: Diagnosis not present

## 2016-07-08 DIAGNOSIS — R350 Frequency of micturition: Secondary | ICD-10-CM | POA: Diagnosis not present

## 2016-07-08 MED ORDER — LISINOPRIL 10 MG PO TABS: 10.0000 mg | ORAL_TABLET | Freq: Every day | ORAL | 5 refills | Status: DC

## 2016-07-08 NOTE — Assessment & Plan Note (Signed)
Mild to moderate symptoms today. Her daughter is very worried about this, but the patient is not as concerned (she told me to "ignore that daughter"). I would like to avoid anticholinergics because she is at high risk for falling. Plan is for conservative measures, I told her to do timed voiding every 2 hours, reduce oral fluid intake, no fluids after 5pm. If symptoms worsen we can try low dose vesicare.

## 2016-07-08 NOTE — Assessment & Plan Note (Signed)
No additional falls since I last saw her. Several risk factors including frailty due to advanced age and legally blind. The rolling walker that I ordered in August has not been delivered, we will talk with the DME company about the delay. I'm also going to continue to cut back on centrally acting medications. She's had a couple pain medication prescriptions from providers outside of our clinic including Vicodin and Percocet, and methocarbamol. I have discontinued these medicines, she denies needing them and does not remember why they were prescribed. I asked her to please let me know if any other physician writes her a prescription for anything.

## 2016-07-08 NOTE — Progress Notes (Signed)
Assessment and Plan:  See Encounters tab for problem-based medical decision making.   __________________________________________________________  HPI:  80 year old woman who comes in today for follow-up of hypertension. The patient has been struggling over the last few months with a change in her living arrangements. She use to live with her sister in a house, but was recently moved to an apartment building at [place] by her daughter. The patient reports that she does not like the apartment holding as much. She misses her house. She feels very angry about having to move. Initially she told me she even felt a little depressed. She lives by herself in this apartment. She reports that she does not get out of the apartment to exercise. She stays in the apartment all day long. Her granddaughter does all of her shopping and brings food for her several times a week. She says the only time she leaves the department is to come to our office visits. Denies any recent falls. She reports good compliance with her medications but is unsure what she takes. She does report some urinary frequency, uses the bathroom about every 2-3 hours. Denies much incontinence. No dribbling with stress. No accidents. No fevers or chills. No recent hospitalizations or visit to the emergency department. Denies any chest pain, orthopnea, or PND. Completed a PHQ9 today with a score of 1.  __________________________________________________________  Problem List: Patient Active Problem List   Diagnosis Date Noted  . At high risk for falls 04/15/2016    Priority: High  . Calcification of aorta (HCC) 04/15/2016    Priority: Medium  . Left leg pain 04/04/2016    Priority: Medium  . GLAUCOMA NOS 04/22/2007    Priority: Medium  . Essential hypertension 04/22/2007    Priority: Medium  . Urinary frequency 06/20/2016    Priority: Low  . Health care maintenance 09/21/2014    Priority: Low  . Blindness of right eye with low  vision in contralateral eye 06/27/2011    Priority: Low    Medications: Reconciled today in Epic __________________________________________________________  Physical Exam:  Vital Signs: Vitals:   07/08/16 0824 07/08/16 0848  BP: (!) 158/61 (!) 161/64  Pulse: 67 69  Temp: 97.6 F (36.4 C)   TempSrc: Oral   SpO2: 100%   Weight: 153 lb 6.4 oz (69.6 kg)     Gen: Well appearing, older frail woman with a white walking stick Neck: No cervical LAD, No thyromegaly. CV: RRR, no murmurs Pulm: Normal effort, CTA throughout, no wheezing Ext: Warm, 1+ left > right pitting edema Skin: No atypical appearing moles. No rashes

## 2016-07-08 NOTE — Assessment & Plan Note (Signed)
Blood pressure is elevated today. It's a little hard to tell if she took her medications today or not and how good her compliance is at home. There have been a few changes to her antihypertensives over the last few months due to her urinary urgency. The triamterene-HCTZ combination tablet was changed to HCTZ alone. For now going to add lisinopril 10 mg once daily, and continue amlodipine 5, HCTZ 25, and atenolol 25. Patient should follow-up in one month for blood pressure recheck and a basic metabolic panel. I'm going to discontinue the potassium now as well as she may not need it after initiating an ACE inhibitor. I enlisted her granddaughter who accompanied her today to double check home pill bottles against our medication list from today, and to promptly inform me of any disparities.

## 2016-07-08 NOTE — Patient Instructions (Signed)
1. Take all your medications as written on this medicine list.   2. Call me if you have any new problems. I would like to know about any new prescriptions from other doctors.   3. I will see about getting you a rolling walker to be used in the house and outside.  4. Try to walk outside the house for 15 minutes every day.

## 2016-07-12 ENCOUNTER — Other Ambulatory Visit: Payer: Self-pay | Admitting: Student in an Organized Health Care Education/Training Program

## 2016-08-02 ENCOUNTER — Telehealth: Payer: Self-pay | Admitting: Student in an Organized Health Care Education/Training Program

## 2016-08-02 NOTE — Telephone Encounter (Signed)
APT. REMINDER CALL, LMTCB °

## 2016-08-05 ENCOUNTER — Encounter: Payer: Self-pay | Admitting: Student in an Organized Health Care Education/Training Program

## 2016-08-05 ENCOUNTER — Ambulatory Visit (INDEPENDENT_AMBULATORY_CARE_PROVIDER_SITE_OTHER): Payer: Medicare Other | Admitting: Student in an Organized Health Care Education/Training Program

## 2016-08-05 ENCOUNTER — Encounter (INDEPENDENT_AMBULATORY_CARE_PROVIDER_SITE_OTHER): Payer: Self-pay

## 2016-08-05 VITALS — BP 168/61 | HR 64 | Temp 97.7°F | Ht 62.0 in | Wt 153.9 lb

## 2016-08-05 DIAGNOSIS — R413 Other amnesia: Secondary | ICD-10-CM | POA: Diagnosis not present

## 2016-08-05 DIAGNOSIS — Z79899 Other long term (current) drug therapy: Secondary | ICD-10-CM

## 2016-08-05 DIAGNOSIS — F0283 Dementia in other diseases classified elsewhere, unspecified severity, with mood disturbance: Secondary | ICD-10-CM | POA: Insufficient documentation

## 2016-08-05 DIAGNOSIS — G3184 Mild cognitive impairment, so stated: Secondary | ICD-10-CM | POA: Insufficient documentation

## 2016-08-05 DIAGNOSIS — Z9181 History of falling: Secondary | ICD-10-CM

## 2016-08-05 DIAGNOSIS — I10 Essential (primary) hypertension: Secondary | ICD-10-CM | POA: Diagnosis not present

## 2016-08-05 DIAGNOSIS — R350 Frequency of micturition: Secondary | ICD-10-CM | POA: Diagnosis not present

## 2016-08-05 DIAGNOSIS — F02B11 Dementia in other diseases classified elsewhere, moderate, with agitation: Secondary | ICD-10-CM | POA: Insufficient documentation

## 2016-08-05 DIAGNOSIS — Z87891 Personal history of nicotine dependence: Secondary | ICD-10-CM

## 2016-08-05 HISTORY — DX: Dementia in other diseases classified elsewhere, unspecified severity, with mood disturbance: F02.83

## 2016-08-05 MED ORDER — LISINOPRIL 20 MG PO TABS: 20.0000 mg | ORAL_TABLET | Freq: Every day | ORAL | 3 refills | Status: DC

## 2016-08-05 NOTE — Progress Notes (Signed)
Assessment and Plan:  See Encounters tab for problem-based medical decision making.   __________________________________________________________  HPI:  80 year old woman here for follow-up of hypertension. In the office last month her blood pressure was above goal, we added on ACE inhibitor. She reports good compliance with her medications. Does not check blood pressure at home. Lives by herself, independent in activities of daily living. Still reports having urinary frequency throughout the day. Says she has an urge to urinate every 45 minutes. Occasionally deals like she's unable to pass urine. Feels some pain at the end of the urine stream, denies burning with urination. She is accompanied by her daughter today who is worried about her memory. Daughter reports that she is forgetting many details about the day, like what she ate that morning for breakfast.   __________________________________________________________  Problem List: Patient Active Problem List   Diagnosis Date Noted  . At high risk for falls 04/15/2016    Priority: High  . Mild cognitive impairment 08/05/2016    Priority: Medium  . Calcification of aorta (HCC) 04/15/2016    Priority: Medium  . GLAUCOMA NOS 04/22/2007    Priority: Medium  . Essential hypertension 04/22/2007    Priority: Medium  . Urinary frequency 06/20/2016    Priority: Low  . Health care maintenance 09/21/2014    Priority: Low  . 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  Ext: Warm, no edema, normal joints Skin: No atypical appearing moles. No rashes

## 2016-08-05 NOTE — Patient Instructions (Signed)
1. Stop Hyrdochlorathiazide, which is a diuretic blood pressure medicine.  2. Increase Lisinopril to 20mg  once a day.   3. Take your blood pressure medicines every day, especially on days you come to our office for BP checks.   4. We will follow up your urine urgency issue at our next visit, hopefully it will be improved with stopping the diuretic.

## 2016-08-05 NOTE — Assessment & Plan Note (Signed)
Blood pressure is not at goal today. She again did not take her medications this morning. We are going to have to stop the thiazide diuretic because of urinary frequency. Plan to increase lisinopril from 10 mg to 20 mg once daily, continue atenolol 25 mg daily, and stop HCTZ. Check be met today he comes ACE inhibitor was started last month. Follow-up with me in 1 month for another blood pressure check, and we may have to increase the ACE inhibitor further.

## 2016-08-05 NOTE — Assessment & Plan Note (Addendum)
Patient's daughter has noticed worsening memory over the last several months. Patient continues to live by herself and is independent in her activities of daily living. On my exam the patient has good working [employer] fast streamermemory. Our office does not have access to the blind version of MOCA. She added nickel, dime, and quarter; she spelled world backwards; good working memory here. This is consistent with MCI and not dementia at this point. Plan will be for careful monitoring. I continue to recommend reducing centrally acting medications like oxycodone and muscle relaxers.

## 2016-08-05 NOTE — Assessment & Plan Note (Signed)
Persistent urinary frequency. Timed voiding has not helped her. She has clinical features of overactive bladder. Plan is to stop thiazide diuretic now. I am still very trepidations at starting the anticholinergics because of the increased fall risk. Also recommended stopping oxycodone for urinary retention symptoms. I think if stopping the diuretic and narcotic does not work, we will have to refer her to gynecology.

## 2016-08-06 ENCOUNTER — Encounter: Payer: Self-pay | Admitting: Student in an Organized Health Care Education/Training Program

## 2016-08-06 LAB — BMP8+ANION GAP
Anion Gap: 19 mmol/L — ABNORMAL HIGH (ref 10.0–18.0)
BUN / CREAT RATIO: 12 (ref 12–28)
BUN: 9 mg/dL (ref 8–27)
CHLORIDE: 102 mmol/L (ref 96–106)
CO2: 25 mmol/L (ref 18–29)
Calcium: 9.7 mg/dL (ref 8.7–10.3)
Creatinine, Ser: 0.78 mg/dL (ref 0.57–1.00)
GFR calc non Af Amer: 73 mL/min/{1.73_m2} (ref >59)
GFR, EST AFRICAN AMERICAN: 84 mL/min/{1.73_m2} (ref >59)
GLUCOSE: 96 mg/dL (ref 65–99)
POTASSIUM: 3.5 mmol/L (ref 3.5–5.2)
SODIUM: 146 mmol/L — AB (ref 134–144)

## 2016-09-16 ENCOUNTER — Ambulatory Visit (INDEPENDENT_AMBULATORY_CARE_PROVIDER_SITE_OTHER): Payer: Medicare Other | Admitting: Student in an Organized Health Care Education/Training Program

## 2016-09-16 ENCOUNTER — Encounter: Payer: Self-pay | Admitting: Student in an Organized Health Care Education/Training Program

## 2016-09-16 VITALS — BP 156/70 | HR 59 | Temp 97.7°F | Ht 62.0 in | Wt 158.7 lb

## 2016-09-16 DIAGNOSIS — Z87891 Personal history of nicotine dependence: Secondary | ICD-10-CM

## 2016-09-16 DIAGNOSIS — Z9181 History of falling: Secondary | ICD-10-CM | POA: Diagnosis not present

## 2016-09-16 DIAGNOSIS — R350 Frequency of micturition: Secondary | ICD-10-CM | POA: Diagnosis not present

## 2016-09-16 DIAGNOSIS — Z79899 Other long term (current) drug therapy: Secondary | ICD-10-CM

## 2016-09-16 DIAGNOSIS — I10 Essential (primary) hypertension: Secondary | ICD-10-CM | POA: Diagnosis not present

## 2016-09-16 MED ORDER — LISINOPRIL 40 MG PO TABS: 40.0000 mg | ORAL_TABLET | Freq: Every day | ORAL | 3 refills | Status: DC

## 2016-09-16 NOTE — Assessment & Plan Note (Signed)
Blood pressure is still above goal, even on recheck. We will increase lisinopril from 20 mg to 40 mg daily, and continue amlodipine and atenolol. I did note her increasing bilateral lower extremity edema and advised that we will watch this.

## 2016-09-16 NOTE — Assessment & Plan Note (Signed)
Continues to have the urge to urinate every 15-30 minutes. No stress incontinence with coughing or straining. She feels like this is much better since we stopped the thiazide diuretic. We talked briefly about anticholinergic medications like Vesicare. I'm still very worried about how they will contribute to risk of falls. I offered her referral to gynecology for discussion about newer agents. We decided to watch her symptoms for now and she will call me if the urinary urgency increases.

## 2016-09-16 NOTE — Progress Notes (Signed)
Assessment and Plan:  See Encounters tab for problem-based medical decision making.   __________________________________________________________  HPI:  81 year old woman here for follow-up of hypertension. At her last visit we increased lisinopril from 10-20 mg. She reports good compliance with this medication. No adverse side effects. No lightheadedness or dizziness with position changes. She is noting a little more increased swelling in her lower extremities. Good exertional capacity, no chest pain or dyspnea with exertion. No fevers or chills at home. No recent illness. Did take her medications this morning. Urinary urgency is somewhat improved. She reports going to the bathroom every 30 minutes, but does not urinate each time. No episodes of incontinence. Continues to have a small amount of burning pain at the very end of her stream, last for only a few seconds and then resolves. No falls recently.  __________________________________________________________  Problem List: Patient Active Problem List   Diagnosis Date Noted  . At high risk for falls 04/15/2016    Priority: High  . Mild cognitive impairment 08/05/2016    Priority: Medium  . Calcification of aorta (HCC) 04/15/2016    Priority: Medium  . GLAUCOMA NOS 04/22/2007    Priority: Medium  . Essential hypertension 04/22/2007    Priority: Medium  . Urinary frequency 06/20/2016    Priority: Low  . Health care maintenance 09/21/2014    Priority: Low  . 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  Ext: Warm, 1+ bilateral edema, normal joints Skin: No atypical appearing moles. No rashes

## 2016-09-16 NOTE — Patient Instructions (Signed)
1. Your blood pressure is still elevated. We should increase the lisinopril to 40mg  once a day.   2. Come back in two months so I can check your blood pressure again please.

## 2016-09-16 NOTE — Assessment & Plan Note (Addendum)
No falls since her last visit. We have worked hard to reduce centrally acting medications. DEXA scan in 2009 was appropriate. She will continue to use a rolling walker at home. We will have to be careful with all future medications and how it affects her fall risk.

## 2016-09-20 ENCOUNTER — Encounter (HOSPITAL_COMMUNITY): Payer: Self-pay | Admitting: *Deleted

## 2016-09-20 DIAGNOSIS — E876 Hypokalemia: Secondary | ICD-10-CM | POA: Diagnosis not present

## 2016-09-20 DIAGNOSIS — R1031 Right lower quadrant pain: Secondary | ICD-10-CM | POA: Diagnosis not present

## 2016-09-20 DIAGNOSIS — I10 Essential (primary) hypertension: Secondary | ICD-10-CM | POA: Insufficient documentation

## 2016-09-20 DIAGNOSIS — R10819 Abdominal tenderness, unspecified site: Secondary | ICD-10-CM | POA: Diagnosis not present

## 2016-09-20 DIAGNOSIS — I2510 Atherosclerotic heart disease of native coronary artery without angina pectoris: Secondary | ICD-10-CM | POA: Diagnosis not present

## 2016-09-20 DIAGNOSIS — R52 Pain, unspecified: Secondary | ICD-10-CM | POA: Diagnosis not present

## 2016-09-20 DIAGNOSIS — Z87891 Personal history of nicotine dependence: Secondary | ICD-10-CM | POA: Diagnosis not present

## 2016-09-20 LAB — CBC
HCT: 35.8 % — ABNORMAL LOW (ref 36.0–46.0)
Hemoglobin: 12.3 g/dL (ref 12.0–15.0)
MCH: 29.3 pg (ref 26.0–34.0)
MCHC: 34.4 g/dL (ref 30.0–36.0)
MCV: 85.2 fL (ref 78.0–100.0)
PLATELETS: 236 10*3/uL (ref 150–400)
RBC: 4.2 MIL/uL (ref 3.87–5.11)
RDW: 13.2 % (ref 11.5–15.5)
WBC: 7.6 10*3/uL (ref 4.0–10.5)

## 2016-09-20 MED ORDER — FENTANYL CITRATE (PF) 100 MCG/2ML IJ SOLN
50.0000 ug | INTRAMUSCULAR | Status: DC | PRN
  Filled 2016-09-20: qty 2

## 2016-09-20 NOTE — ED Notes (Signed)
Pt complains of RLQ pain for two days, worse today and when standing Pt was treated for a UTI two weeks ago, no new symptoms

## 2016-09-21 ENCOUNTER — Emergency Department (HOSPITAL_COMMUNITY)
Admission: EM | Admit: 2016-09-21 | Discharge: 2016-09-21 | Disposition: A | Discharge status: Home / Self Care | Payer: Medicare Other | Attending: Emergency Medicine | Admitting: Emergency Medicine

## 2016-09-21 DIAGNOSIS — E876 Hypokalemia: Secondary | ICD-10-CM

## 2016-09-21 DIAGNOSIS — R1031 Right lower quadrant pain: Secondary | ICD-10-CM

## 2016-09-21 LAB — COMPREHENSIVE METABOLIC PANEL
ALBUMIN: 3.9 g/dL (ref 3.5–5.0)
ALT: 13 U/L — AB (ref 14–54)
AST: 17 U/L (ref 15–41)
Alkaline Phosphatase: 121 U/L (ref 38–126)
Anion gap: 10 (ref 5–15)
BUN: 13 mg/dL (ref 6–20)
CHLORIDE: 104 mmol/L (ref 101–111)
CO2: 25 mmol/L (ref 22–32)
CREATININE: 0.7 mg/dL (ref 0.44–1.00)
Calcium: 8.7 mg/dL — ABNORMAL LOW (ref 8.9–10.3)
GFR calc Af Amer: >60 mL/min (ref >60)
GLUCOSE: 128 mg/dL — AB (ref 65–99)
POTASSIUM: 2.5 mmol/L — AB (ref 3.5–5.1)
Sodium: 139 mmol/L (ref 135–145)
Total Bilirubin: 0.4 mg/dL (ref 0.3–1.2)
Total Protein: 7 g/dL (ref 6.5–8.1)

## 2016-09-21 LAB — LIPASE, BLOOD: LIPASE: 37 U/L (ref 11–51)

## 2016-09-21 MED ORDER — POTASSIUM CHLORIDE CRYS ER 20 MEQ PO TBCR: 20.0000 meq | EXTENDED_RELEASE_TABLET | Freq: Two times a day (BID) | ORAL | 0 refills | Status: DC

## 2016-09-21 MED ORDER — POTASSIUM CHLORIDE CRYS ER 20 MEQ PO TBCR
40.0000 meq | EXTENDED_RELEASE_TABLET | Freq: Once | ORAL | Status: AC
  Administered 2016-09-21: 40 meq via ORAL
  Filled 2016-09-21: qty 2

## 2016-09-21 MED ORDER — POTASSIUM CHLORIDE 20 MEQ/15ML (10%) PO SOLN
10.0000 meq | Freq: Once | ORAL | Status: AC
  Administered 2016-09-21: 10 meq via ORAL
  Filled 2016-09-21: qty 15

## 2016-09-21 NOTE — ED Triage Notes (Signed)
CRITICAL VALUE ALERT  Critical value received:  K+2.5   Date of notification:  09/21/14  Time of notification:  0015  Critical value read back:Yes.    Nurse who received alert:  jrm  MD notified (1st page):  edp  Time of first page:  0015  MD notified (2nd page):  Time of second page:  Responding MD:  edp  Time MD responded:  [phone]

## 2016-09-21 NOTE — ED Provider Notes (Signed)
[facility]-EMERGENCY DEPT Provider Note: [name] Diamond Lucchetti, MD, FACEP  CSN: [id number] MRN: [id number] ARRIVAL: 09/20/16 at 2128 ROOM: WA17/WA17  By signing my name below, I, [name], attest that this documentation has been prepared under the direction and in the presence of Diamond LibraJohn Angle Dirusso, MD. Electronically Signed: Elder Negusussell Tapia, Scribe. 09/21/16. 3:45 AM.  CHIEF COMPLAINT  Abdominal Pain   HISTORY OF PRESENT ILLNESS  [name] is a 81 y.o. female with history of hypertension and glaucoma with R eye prosthesis who presents to the ED for evaluation of abdominal pain. This patient states that yesterday morning she developed right groin pain extending to her RLQ. The pain worsened and became severe about 8 PM and EMS was called. Pain was worse with movement or palpation. She denies any associated diarrhea or vomiting. However at interview the patient is now stating that "EMS gave me a shot" and her pain has resolved. She has no complaints at this time. She is requesting discharge. She states she has been told before that her potassium was low but is not on any potassium supplementation presently.  Past Medical History:  Diagnosis Date  . 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 Left eye sees shadows and is bothered by bright lights. She wears specialty sunglasses which help her with sensitivity.   [name]. CAD (coronary artery disease)    nonobstructive, myoview normal 03/2007, done by Dr. Daleen SquibbWall - EF = 75%  . Glaucoma   . HLD (hyperlipidemia)   . Hypertension     History reviewed. No pertinent surgical history.  Family History  Problem Relation Age of Onset  . Stroke Neg Hx   . Cancer Neg Hx     Social History  Substance Use Topics  . Smoking status: Former Smoker    Types: Cigarettes  . Smokeless tobacco: Never Used  . Alcohol use No    Prior to  Admission medications   Medication Sig Start Date End Date Taking? Authorizing Provider  amLODipine (NORVASC) 5 MG tablet TAKE 1 TABLET DAILY 07/12/16   Diamond Aliasuncan Thomas Vincent, MD  atenolol (TENORMIN) 25 MG tablet Take 1 tablet (25 mg total) by mouth daily. 11/30/15   Diamond PoissonLawrence Klima, MD  bimatoprost (LUMIGAN) 0.03 % ophthalmic drops Place 1 drop into the left eye at bedtime.      Historical Provider, MD  brimonidine (ALPHAGAN P) 0.1 % SOLN 1 drop in left eye twice a day    Historical Provider, MD  lisinopril (PRINIVIL,ZESTRIL) 40 MG tablet Take 1 tablet (40 mg total) by mouth daily. 09/16/16 09/16/17  Diamond Aliasuncan Thomas Vincent, MD    Allergies Aspirin and Codeine   REVIEW OF SYSTEMS  Negative except as noted here or in the History of Present Illness.   PHYSICAL EXAMINATION  Initial Vital Signs Blood pressure 162/68, pulse 65, temperature 98.9 F (37.2 C), temperature source Oral, resp. rate 16, SpO2 94 %.  Examination General: Well-developed, well-nourished female in no acute distress; appearance consistent with age of record HENT: normocephalic; atraumatic Eyes: R eye prothesis; L pupil is round and sluggishly reactive to light; extra-occular movements are intact on left but limited on right; left arcus senilis Neck: supple Heart: regular rate and rhythm Lungs: clear to auscultation bilaterally Abdomen: soft; nondistended; mild R groin fold tenderness without hernia or lymphadenopathy; no masses or hepatosplenomegaly; bowel sounds present Extremities: No deformity; full range of  motion; pulses normal Neurologic: Awake, alert and oriented; motor function intact in all extremities and symmetric; no facial droop; Skin: Warm and dry Psychiatric: Normal mood and affect   RESULTS  Summary of this visit's results, reviewed by myself:   EKG Interpretation  Date/Time:  Saturday September 21 2016 03:52:05 EST Ventricular Rate:  68 PR Interval:    QRS Duration: 100 QT Interval:  398 QTC  Calculation: 424 R Axis:   5 Text Interpretation:  Sinus rhythm Probable left ventricular hypertrophy No significant change was found Confirmed by Diamond Starliper  MD, [name] ([id number]) on 09/21/2016 3:54:39 AM      Laboratory Studies: Results for orders placed or performed during the hospital encounter of 09/21/16 (from the past 24 hour(s))  Lipase, blood     Status: None   Collection Time: 09/20/16 11:41 PM  Result Value Ref Range   Lipase 37 11 - 51 U/L  Comprehensive metabolic panel     Status: Abnormal   Collection Time: 09/20/16 11:41 PM  Result Value Ref Range   Sodium 139 135 - 145 mmol/L   Potassium 2.5 (LL) 3.5 - 5.1 mmol/L   Chloride 104 101 - 111 mmol/L   CO2 25 22 - 32 mmol/L   Glucose, Bld 128 (H) 65 - 99 mg/dL   BUN 13 6 - 20 mg/dL   Creatinine, Ser 5.62 0.44 - 1.00 mg/dL   Calcium 8.7 (L) 8.9 - 10.3 mg/dL   Total Protein 7.0 6.5 - 8.1 g/dL   Albumin 3.9 3.5 - 5.0 g/dL   AST 17 15 - 41 U/L   ALT 13 (L) 14 - 54 U/L   Alkaline Phosphatase 121 38 - 126 U/L   Total Bilirubin 0.4 0.3 - 1.2 mg/dL   GFR calc non Af Amer >60 >60 mL/min   GFR calc Af Amer >60 >60 mL/min   Anion gap 10 5 - 15  CBC     Status: Abnormal   Collection Time: 09/20/16 11:41 PM  Result Value Ref Range   WBC 7.6 4.0 - 10.5 K/uL   RBC 4.20 3.87 - 5.11 MIL/uL   Hemoglobin 12.3 12.0 - 15.0 g/dL   HCT 13.0 (L) 86.5 - 78.4 %   MCV 85.2 78.0 - 100.0 fL   MCH 29.3 26.0 - 34.0 pg   MCHC 34.4 30.0 - 36.0 g/dL   RDW 69.6 29.5 - 28.4 %   Platelets 236 150 - 400 K/uL   Imaging Studies: No results found.  ED COURSE  Nursing notes and initial vitals signs, including pulse oximetry, reviewed.  Vitals:   09/20/16 2245 09/21/16 0116  BP: 141/70 162/68  Pulse: 65 65  Resp: 18 16  Temp: 98.3 F (36.8 C) 98.9 F (37.2 C)  TempSrc: Oral Oral  SpO2: 99% 94%   3:56 AM EKG shows no U waves or other acute signs of hypokalemia. I suspect the patient's hypokalemia has been ongoing for some time. The patient is  insistent that she cannot stay in the ED anymore this morning. We will start her on potassium supplementation in the ED and have her follow-up with her primary care physician.  PROCEDURES    ED DIAGNOSES     ICD-9-CM ICD-10-CM   1. Hypokalemia 276.8 E87.6   2. Right groin pain 789.09 R10.31     I personally performed the services described in this documentation, which was scribed in my presence. The recorded information has been reviewed and is accurate.    Diamond Libra, MD 09/21/16  0356  

## 2016-09-22 ENCOUNTER — Other Ambulatory Visit: Payer: Self-pay | Admitting: Internal Medicine

## 2016-09-23 NOTE — Telephone Encounter (Signed)
Pt seen in ED on 09/20/2016-labs revealed potassium 2.5.  Per epic, rx was printed out for potassium 20 meq #14.  I am now receiving a refill request from pt's pharmacy for potassium 20 meq #30.  Attempted to follow up with pt to see if how she was doing and to see if she filled rx given in ED.  Number rings busy, will try again.Kingsley SpittleGoldston, Darlene Cassady1/29/20183:14 PM

## 2016-09-25 ENCOUNTER — Ambulatory Visit (INDEPENDENT_AMBULATORY_CARE_PROVIDER_SITE_OTHER): Payer: Medicare Other | Admitting: Internal Medicine

## 2016-09-25 VITALS — BP 147/56 | HR 68 | Temp 97.7°F | Ht 62.0 in | Wt 156.2 lb

## 2016-09-25 DIAGNOSIS — R252 Cramp and spasm: Secondary | ICD-10-CM

## 2016-09-25 DIAGNOSIS — Z5189 Encounter for other specified aftercare: Secondary | ICD-10-CM | POA: Diagnosis not present

## 2016-09-25 DIAGNOSIS — E876 Hypokalemia: Secondary | ICD-10-CM

## 2016-09-25 DIAGNOSIS — Z87891 Personal history of nicotine dependence: Secondary | ICD-10-CM

## 2016-09-25 NOTE — Progress Notes (Signed)
   CC: Leg pain  HPI:  [name] is a 81 y.o. ED visit on Friday for severe leg pain where she was noted to have very low potassium of 2.5.  See problem based assessment and plan below for additional details  Past Medical History:  Diagnosis Date  . 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 Left eye sees shadows and is bothered by bright lights. She wears specialty sunglasses which help her with sensitivity.   [name]. CAD (coronary artery disease)    nonobstructive, myoview normal 03/2007, done by Dr. Daleen SquibbWall - EF = 75%  . Glaucoma   . HLD (hyperlipidemia)   . Hypertension     Review of Systems:  Review of Systems  Constitutional: Negative for fever.  Cardiovascular: Negative for chest pain.  Gastrointestinal: Positive for abdominal pain.  Genitourinary: Negative for dysuria.  Musculoskeletal: Positive for myalgias. Negative for falls.  Skin: Negative for rash.  Neurological: Negative for sensory change.    Physical Exam: Physical Exam  Constitutional: She is well-developed, well-nourished, and in no distress.  HENT:  Mouth/Throat: Oropharynx is clear and moist. No oropharyngeal exudate.  Cardiovascular: Normal rate and regular rhythm.   Pulmonary/Chest: Effort normal and breath sounds normal.  Musculoskeletal:  No significant tenderness over thighs or calves to direct palpation, good ROM, no joint effusions, no peripheral edema  Skin: Skin is warm and dry.    Vitals:   09/25/16 1453  BP: (!) 147/56  Pulse: 68  Temp: 97.7 F (36.5 C)  TempSrc: Oral  SpO2: 99%  Weight: 156 lb 3.2 oz (70.9 kg)  Height: 5\' 2"  (1.575 m)    Assessment & Plan:   See Encounters Tab for problem based charting.  Patient discussed with Dr. Criselda PeachesMullen

## 2016-09-25 NOTE — Patient Instructions (Signed)
It was a pleasure to see you today [name].  I suspect your cramps are due to a metabolic problem with low potassium. The cause of recurrent low potassium is still a bit unclear so we are checking lab work to confirm these causes today. Unfortunately I do not have an obvious answer yet at this time. I think we will need to see you again in the clinic before long.  Continue taking your medications including the potassium and I will call you tomorrow about the lab results with instructions for how to adjust this medication.

## 2016-09-26 ENCOUNTER — Telehealth: Payer: Self-pay

## 2016-09-26 DIAGNOSIS — E876 Hypokalemia: Secondary | ICD-10-CM

## 2016-09-26 DIAGNOSIS — M62838 Other muscle spasm: Secondary | ICD-10-CM

## 2016-09-26 LAB — URINALYSIS, ROUTINE W REFLEX MICROSCOPIC
Bilirubin, UA: NEGATIVE
Glucose, UA: NEGATIVE
Ketones, UA: NEGATIVE
NITRITE UA: NEGATIVE
PH UA: 5.5 (ref 5.0–7.5)
Protein, UA: NEGATIVE
RBC, UA: NEGATIVE
Specific Gravity, UA: 1.011 (ref 1.005–1.030)
Urobilinogen, Ur: 0.2 mg/dL (ref 0.2–1.0)

## 2016-09-26 LAB — MICROSCOPIC EXAMINATION
Casts: NONE SEEN /lpf
WBC, UA: >30 /hpf — AB (ref 0–?)

## 2016-09-26 LAB — BMP8+ANION GAP
Anion Gap: 17 mmol/L (ref 10.0–18.0)
BUN / CREAT RATIO: 15 (ref 12–28)
BUN: 11 mg/dL (ref 8–27)
CO2: 22 mmol/L (ref 18–29)
CREATININE: 0.72 mg/dL (ref 0.57–1.00)
Calcium: 9.5 mg/dL (ref 8.7–10.3)
Chloride: 105 mmol/L (ref 96–106)
GFR calc non Af Amer: 80 mL/min/{1.73_m2} (ref >59)
GFR, EST AFRICAN AMERICAN: 93 mL/min/{1.73_m2} (ref >59)
Glucose: 96 mg/dL (ref 65–99)
Potassium: 4.2 mmol/L (ref 3.5–5.2)
Sodium: 144 mmol/L (ref 134–144)

## 2016-09-26 NOTE — Telephone Encounter (Signed)
Requesting lab result. Please call back. 

## 2016-09-27 NOTE — Assessment & Plan Note (Addendum)
HPI: [name] was recently seen in the ED for worsening bilateral leg cramps. These were occuring intermittently without obvious provoking activity. They do wake her from sleeping occasionally. She has been eating poorly and her daughter frequently has to encourage eating well. She had not suffered any diarrhea, nausea, or vomiting. She is chronically unsteady on her feet and walks with a cane so weakeness is unclear. While at the ED she was found to be significantly hypokalemic to 2.5. She was placed on supplementation with 40 mEq daily potassium and instructed to follow up with PCP office. She has a history of similar severe muscle cramping last year in June affecting mostly her neck that was also associated with hypokalemia of 2.5 at an ED visit.  A: Recurrent hypokalemia and probably associated muscle cramping The cause of this low potassium is not entirely apparent at this time. It is possibly from very poor PO intake but there is no associated evidence of dehydration or hyponatremia. Her weight is not significant decreased today. She has no history of GI losses. Renal excretion could possibly be from hyperaldosteronism that would also contribute to her hypertension although that does not explain this being an episodic problem. Renal tubular acidosis type I or II could contribute to inappropriate potassium loss.  P: -Repeat Bmet -Check magnesium -Check renin/aldosterone level -Urinalysis today to assess of acidification of urine that could suggest RTA -Continue potassium 20mEq PO BID for now until results are available -She will need follow up relatively soon with her high current fall risk worsened by this problem

## 2016-09-28 LAB — ALDOSTERONE + RENIN ACTIVITY W/ RATIO: ALDOSTERONE: 2 ng/dL (ref 0.0–30.0)

## 2016-09-30 MED ORDER — CYCLOBENZAPRINE HCL 5 MG PO TABS: 5.0000 mg | ORAL_TABLET | Freq: Three times a day (TID) | ORAL | 0 refills | Status: DC | PRN

## 2016-09-30 NOTE — Telephone Encounter (Signed)
Spoke w/ daughter, she is very upset that no one has called, paged dr rice, he states he has tried to call her back but no answer, transferred call to dr rice

## 2016-09-30 NOTE — Telephone Encounter (Signed)
I initially missed [name] on Friday with results and had not made a second attempt to contact by this time. I spoke with her today regarding her recent studies that did not strongly suggest a mechanism for her recurrent hypokalemia to me. I think it is reasonable to continue holding further potassium supplementation until she returns to the clinic this coming week and we can recheck this. If it remains normal after being off treatment for a week this may have been an episodic event or if it decreases again long term supplementation may be the best option.  She also continues to have the same problem with leg cramping despite treatment. Her side and abdominal pains are resolved but not leg cramps. She asks if she can try taking a medicine she had before for muscle spasms. On chart review, she was previously prescribed Flexeril although this was discontinued due to her symptoms improving and concern for her fall risk. Given the severity of her complaints and tolerating this medicine fairly well in the past I think a repeat trial is reasonable and called this prescription 5mg  TID PRN in to her pharmacy at this time.

## 2016-09-30 NOTE — Telephone Encounter (Signed)
Calling asking for the dr call about results.

## 2016-10-01 LAB — MAGNESIUM: MAGNESIUM: 2 mg/dL (ref 1.6–2.3)

## 2016-10-01 NOTE — Progress Notes (Signed)
Internal Medicine Clinic Attending  Case discussed with Dr. Rice soon after the resident saw the patient.  We reviewed the resident's history and exam and pertinent patient test results.  I agree with the assessment, diagnosis, and plan of care documented in the resident's note. 

## 2016-10-02 ENCOUNTER — Ambulatory Visit: Payer: Medicare Other

## 2016-10-03 ENCOUNTER — Encounter: Payer: Self-pay | Admitting: Internal Medicine

## 2016-10-03 ENCOUNTER — Ambulatory Visit (INDEPENDENT_AMBULATORY_CARE_PROVIDER_SITE_OTHER): Payer: Medicare Other | Admitting: Internal Medicine

## 2016-10-03 VITALS — BP 149/63 | HR 62 | Temp 97.6°F | Wt 157.0 lb

## 2016-10-03 DIAGNOSIS — R252 Cramp and spasm: Secondary | ICD-10-CM | POA: Diagnosis not present

## 2016-10-03 DIAGNOSIS — E876 Hypokalemia: Secondary | ICD-10-CM

## 2016-10-03 DIAGNOSIS — Z87891 Personal history of nicotine dependence: Secondary | ICD-10-CM | POA: Diagnosis not present

## 2016-10-03 DIAGNOSIS — M62838 Other muscle spasm: Secondary | ICD-10-CM

## 2016-10-03 MED ORDER — CYCLOBENZAPRINE HCL 5 MG PO TABS
5.0000 mg | ORAL_TABLET | Freq: Three times a day (TID) | ORAL | 0 refills | Status: DC | PRN
Start: 2016-10-03 — End: 2016-11-18

## 2016-10-03 NOTE — Progress Notes (Signed)
    CC: Follow-up for hypokalemia and muscle cramping  HPI: [name] is a 81 y.o. female with PMHx of hypertension, glaucoma, hypokalemia who presents to the clinic for follow-up for hypokalemia and muscle cramping.   [name] is a 81 year old female who has recently been worked up for hypokalemia. Patient was seen in the clinic one week ago after going to the emergency department for muscle cramps. In the emergency department she was found to have hypokalemia at 2.5. In the emergency department and in our clinic she has had a thorough workup for differential causes of hypokalemia. Patient does not have hyponatremia or dehydration. She denies any recent gastrointestinal losses. Patient's creatinine and aldosterone levels were as expected and were not indicative of hyperaldosteronism. Her repeat a sick metabolic panel showed improved potassium to 4.2 while taking potassium 40 mEq per day. Her urinalysis was normal. Magnesium was normal at 2.0. Patient ran out of her potassium replacement yesterday. She has not taken any this morning.  Patient continues to have muscle cramps that occur throughout the day but are more prominent at night. They occur in both lower extremities in her calf muscles, but her left lower extremity is worse than her right lower extremity. She states this is been going on for a while, at least one year. She cannot tell what seems to bring these on, they just come on randomly. They're not associated with exercise. She denies restless legs at night or feeling the urge to constantly move her legs at night. She has been taking Flexeril 5 mg 3 times a day as needed for the muscle cramping. She states that she gets good relief from this medication. She does not feel excessively fatigued, lightheaded or unsteady when she takes this medication.  Past Medical History:  Diagnosis Date  . 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 Left eye sees shadows and is bothered by bright lights. She wears specialty sunglasses which help her with sensitivity.   [name]. CAD (coronary artery disease)    nonobstructive, myoview normal 03/2007, done by Dr. Daleen SquibbWall - EF = 75%  . Glaucoma   . HLD (hyperlipidemia)   . Hypertension    Review of Systems: Please see pertinent ROS reviewed in HPI and problem based charting.   Physical Exam: Vitals:   10/03/16 0956  BP: (!) 150/62  Pulse: 68  Temp: 97.6 F (36.4 C)  TempSrc: Oral  SpO2: 98%  Weight: 157 lb (71.2 kg)   General: Vital signs reviewed.  Patient is well-developed and well-nourished, in no acute distress and cooperative with exam.  HEENT: Legally blind, wearing sunglasses. No carotid bruits bilaterally. Cardiovascular: RRR, no murmurs, gallops, or rubs. Pulmonary/Chest: Clear to auscultation bilaterally, no wheezes, rales, or rhonchi. Extremities: No lower extremity edema bilaterally, pulses symmetric and intact bilaterally.  Neurological: A&O x3, Strength is normal and symmetric bilaterally, sensory intact to light touch bilaterally.  Skin: Warm, dry and intact. No rashes or erythema.  Assessment & Plan:  See encounters tab for problem based medical decision making. Patient discussed with Dr. Criselda PeachesMullen

## 2016-10-03 NOTE — Assessment & Plan Note (Signed)
Patient continues to have muscle cramps that occur throughout the day but are more prominent at night. They occur in both lower extremities in her calf muscles, but her left lower extremity is worse than her right lower extremity. She states this is been going on for a while, at least one year. She cannot tell what seems to bring these on, they just come on randomly. They're not associated with exercise. She denies restless legs at night or feeling the urge to constantly move her legs at night. She has been taking Flexeril 5 mg 3 times a day as needed for the muscle cramping. She states that she gets good relief from this medication. She does not feel excessively fatigued, lightheaded or unsteady when she takes this medication. Her examination is normal. No tenderness or swelling in her lower extremities.  Her muscle cramps persist despite resolution of hypokalemia. I doubt that these 2 are related, however her hypokalemia may be exacerbating her muscle cramps. She states that her lower extremity muscle cramps have been going on for over a year. I doubt restless leg syndrome given that the cramping occurs during the day and she denies a restless feeling in her legs at night or sensation of needing to move her legs. She reports good tolerance of the cramping and good tolerance of Flexeril. We will continue this for now.

## 2016-10-03 NOTE — Patient Instructions (Signed)
[name],  We will check your potassium today.   For your urinary urgency, we will try bladder training. I have included instructions. If this is not helping over the next one month or so, please follow up in our clinic and we can discuss medication alternatives.  I have refilled your flexeril for spasms.

## 2016-10-03 NOTE — Assessment & Plan Note (Signed)
[name] is a 81 year old female who has recently been worked up for hypokalemia. Patient was seen in the clinic one week ago after going to the emergency department for muscle cramps. In the emergency department she was found to have hypokalemia at 2.5. In the emergency department and in our clinic she has had a thorough workup for differential causes of hypokalemia. Patient does not have hyponatremia or dehydration. She denies any recent gastrointestinal losses. Patient's creatinine and aldosterone levels were as expected and were not indicative of hyperaldosteronism. Her repeat a sick metabolic panel showed improved potassium to 4.2 while taking potassium 40 mEq per day. Her urinalysis was normal. Magnesium was normal at 2.0. Patient ran out of her potassium replacement yesterday. She has not taken any this morning.  Unclear cause of hypokalemia. Given that hypokalemia is intermittent, it may be dietary related. Patient reports poor by mouth intake most at the time. Due to concern and fear of cooking given her blindness. We discussed strategies to increase her potassium intake, with several of the foods being raw and not needing to be correct. Please include avocado, spinach, coconut milk, and dried apricots. We will recheck her basic metabolic panel today. I will refill her potassium supplementation accordingly. Patient should return in 3 months for repeat basic metabolic panel.

## 2016-10-04 ENCOUNTER — Encounter: Payer: Self-pay | Admitting: Internal Medicine

## 2016-10-04 LAB — BMP8+ANION GAP
ANION GAP: 18 mmol/L (ref 10.0–18.0)
BUN / CREAT RATIO: 12 (ref 12–28)
BUN: 10 mg/dL (ref 8–27)
CHLORIDE: 104 mmol/L (ref 96–106)
CO2: 22 mmol/L (ref 18–29)
CREATININE: 0.84 mg/dL (ref 0.57–1.00)
Calcium: 8.9 mg/dL (ref 8.7–10.3)
GFR calc Af Amer: 77 mL/min/{1.73_m2} (ref >59)
GFR calc non Af Amer: 67 mL/min/{1.73_m2} (ref >59)
GLUCOSE: 88 mg/dL (ref 65–99)
POTASSIUM: 3.5 mmol/L (ref 3.5–5.2)
SODIUM: 144 mmol/L (ref 134–144)

## 2016-10-04 MED ORDER — POTASSIUM CHLORIDE CRYS ER 20 MEQ PO TBCR: 40.0000 meq | EXTENDED_RELEASE_TABLET | Freq: Every day | ORAL | 2 refills | Status: DC

## 2016-10-04 NOTE — Addendum Note (Signed)
Addended by: Karlene LinemanBURNS, Lafreda Casebeer R on: 10/04/2016 11:41 AM   Modules accepted: Orders

## 2016-10-08 NOTE — Telephone Encounter (Signed)
See in office on 09/25/16-call complete.

## 2016-10-09 NOTE — Progress Notes (Signed)
Internal Medicine Clinic Attending  Case discussed with Dr. Burns soon after the resident saw the patient.  We reviewed the resident's history and exam and pertinent patient test results.  I agree with the assessment, diagnosis, and plan of care documented in the resident's note. 

## 2016-11-18 ENCOUNTER — Encounter (INDEPENDENT_AMBULATORY_CARE_PROVIDER_SITE_OTHER): Payer: Self-pay

## 2016-11-18 ENCOUNTER — Encounter: Payer: Self-pay | Admitting: Student in an Organized Health Care Education/Training Program

## 2016-11-18 ENCOUNTER — Ambulatory Visit (INDEPENDENT_AMBULATORY_CARE_PROVIDER_SITE_OTHER): Payer: Medicare Other | Admitting: Student in an Organized Health Care Education/Training Program

## 2016-11-18 DIAGNOSIS — Z79899 Other long term (current) drug therapy: Secondary | ICD-10-CM | POA: Diagnosis not present

## 2016-11-18 DIAGNOSIS — Z87891 Personal history of nicotine dependence: Secondary | ICD-10-CM | POA: Diagnosis not present

## 2016-11-18 DIAGNOSIS — M65331 Trigger finger, right middle finger: Secondary | ICD-10-CM | POA: Diagnosis present

## 2016-11-18 DIAGNOSIS — I10 Essential (primary) hypertension: Secondary | ICD-10-CM

## 2016-11-18 DIAGNOSIS — M6530 Trigger finger, unspecified finger: Secondary | ICD-10-CM | POA: Insufficient documentation

## 2016-11-18 DIAGNOSIS — N393 Stress incontinence (female) (male): Secondary | ICD-10-CM

## 2016-11-18 DIAGNOSIS — R350 Frequency of micturition: Secondary | ICD-10-CM

## 2016-11-18 DIAGNOSIS — R252 Cramp and spasm: Secondary | ICD-10-CM

## 2016-11-18 DIAGNOSIS — M62838 Other muscle spasm: Secondary | ICD-10-CM | POA: Diagnosis not present

## 2016-11-18 MED ORDER — AMLODIPINE BESYLATE 10 MG PO TABS: 10.0000 mg | ORAL_TABLET | Freq: Every day | ORAL | 3 refills | Status: DC

## 2016-11-18 NOTE — Progress Notes (Signed)
Assessment and Plan:  See Encounters tab for problem-based medical decision making.   __________________________________________________________  HPI:  81 year old woman here f29or follow-up of hypertension. Patient reports doing well at home recently. Good compliance with medications without side effects. No recent illnesses or hospitalizations. She is accompanied by her daughter today. No recent falls. She did visit the end of her care center for a complaint of muscle cramps. She was prescribed Flexeril, says that she tried it but reported that it made her drowsy. Also said it didn't help her cramps very much. Also prescribed potassium which she is taking. The cramps are intermittent and lasts for only a few minutes. She is also complaining of a more prolonged muscle spasm in her neck, left side of her posterior neck. It's been present for about one week and it is slowly improving with supportive care. Denies any trauma. Third acute complaint today is that of a painful and stiff right middle finger. She says that it pops open after making a fist which is painful for her. She has never had an injection into her hand before.  __________________________________________________________  Problem List: Patient Active Problem List   Diagnosis Date Noted  . At high risk for falls 04/15/2016    Priority: High  . Mild cognitive impairment 08/05/2016    Priority: Medium  . Stress incontinence 06/20/2016    Priority: Medium  . Calcification of aorta (HCC) 04/15/2016    Priority: Medium  . GLAUCOMA NOS 04/22/2007    Priority: Medium  . Essential hypertension 04/22/2007    Priority: Medium  . Muscle cramps 10/03/2016    Priority: Low  . Hypokalemia 03/07/2016    Priority: Low  . Health care maintenance 09/21/2014    Priority: Low  . 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 Trigger finger 11/18/2016    Medications: Reconciled today in  Epic __________________________________________________________  Physical Exam:  Vital Signs: Vitals:   11/18/16 0916  BP: (!) 141/55  Pulse: 69  Temp: 97.6 F (36.4 C)  TempSrc: Oral  SpO2: 99%  Weight: 156 lb 4.8 oz (70.9 kg)  Height: 5\' 2"  (1.575 m)    Gen: Well appearing, NAD ENT: OP clear without erythema or exudate.  Neck: No cervical LAD, No thyromegaly or nodules, No JVD. CV: RRR, no murmurs Pulm: Normal effort, CTA throughout, no wheezing Abd: Soft, NT, ND, normal BS.  Ext: Warm, no edema, normal joints Skin: No atypical appearing moles. No rashes    Digital Flexor Tendon Sheath Injection Procedure Note  Pre-operative diagnosis: Digital Flexor Tenosynovitis (Trigger Finger)  After risks and benefits were explained including bleeding, infection, tendon damage or rupture, allergic reaction to medications, vascular injection, and nerve damage, signed consent was obtained.  All questions were answered.    The flexor surface of the right third finger was cleaned with alcohol swabs. Using a 25 gauge 1 inch needle the flexor tendon was entered, confirmed by movement of the finger. The needle was withdrawn until resistance on the plunger decreased. The tendon sheath space was then injected with 0.5 mL of Triamcinolone 40mg /mL and 0.3 mL of 1% Lidocaine without Epi. The needle was withdrawn, the site was cleaned and covered with a band aid.   The patient did tolerate the procedure well and there were not complications.

## 2016-11-18 NOTE — Assessment & Plan Note (Signed)
Right third finger exam is consistent with a trigger finger. This has symptomatically been bothering her for many months. We discussed risks and benefits of injection into the tendon sheath. She gave consent and I provided a injection of 1 mL of triamcinolone and lidocaine into the third digit tendon sheath. She had no complications and good immediate benefit.

## 2016-11-18 NOTE — Assessment & Plan Note (Signed)
Continues to have significant urinary frequency and stress incontinence. She is now trying over-the-counter AZO supplements which she thinks is helping a little bit. We talked again about the anticholinergics, I continue to think that there risk outweighs the benefit given her high risk for falls. Plan is to refer to gynecology for evaluation of pessary.

## 2016-11-18 NOTE — Assessment & Plan Note (Signed)
Patient has intermittent lower extremity muscle cramps, usually in the evening. She is on potassium supplementation for mild hypokalemia without much benefit. Also has acute muscle spasm of the neck which has been bothering her for the last week which is slowly improving with just supportive care. She has tried Flexeril in the past with limited benefits and causes drowsiness. Patient is high risk for falls so I asked her to discontinue Flexeril. For the next spasm I recommended nonsteroidals with ibuprofen 400 mg twice a day. For the intermittent sudden onset lower extremity spasms I recommended against medication advise just a teaspoon of mustard which can be effective as needed.

## 2016-11-18 NOTE — Assessment & Plan Note (Signed)
Doing fine today, it remains borderline elevated. Plan is to increase amlodipine from 5 mg to 10 mg daily and continue lisinopril 40 mg daily and atenolol 25 mg daily.

## 2016-11-18 NOTE — Patient Instructions (Signed)
Trigger Finger Trigger finger (stenosing tenosynovitis) is a condition that causes a finger to get stuck in a bent position. Each finger has a tough, cord-like tissue that connects muscle to bone (tendon), and each tendon is surrounded by a tunnel of tissue (tendon sheath). To move your finger, your tendon needs to slide freely through the sheath. Trigger finger happens when the tendon or the sheath thickens, making it difficult to move your finger. Trigger finger can affect any finger or a thumb. It may affect more than one finger. Mild cases may clear up with rest and medicine. Severe cases require more treatment. What are the causes? Trigger finger is caused by a thickened finger tendon or tendon sheath. The cause of this thickening is not known. What increases the risk? The following factors may make you more likely to develop this condition:  Doing activities that require a strong grip.  Having rheumatoid arthritis, gout, or diabetes.  Being 40-60 years old.  Being a woman.  What are the signs or symptoms? Symptoms of this condition include:  Pain when bending or straightening your finger.  Tenderness or swelling where your finger attaches to the palm of your hand.  A lump in the palm of your hand or on the inside of your finger.  Hearing a popping sound when you try to straighten your finger.  Feeling a popping, catching, or locking sensation when you try to straighten your finger.  Being unable to straighten your finger.  How is this diagnosed? This condition is diagnosed based on your symptoms and a physical exam. How is this treated? This condition may be treated by:  Resting your finger and avoiding activities that make symptoms worse.  Wearing a finger splint to keep your finger in a slightly bent position.  Taking NSAIDs to relieve pain and swelling.  Injecting medicine (steroids) into the tendon sheath to reduce swelling and irritation. Injections may need to be  repeated.  Having surgery to open the tendon sheath. This may be done if other treatments do not work and you cannot straighten your finger. You may need physical therapy after surgery.  Follow these instructions at home:  Use moist heat to help reduce pain and swelling as told by your health care provider.  Rest your finger and avoid activities that make pain worse. Return to normal activities as told by your health care provider.  If you have a splint, wear it as told by your health care provider.  Take over-the-counter and prescription medicines only as told by your health care provider.  Keep all follow-up visits as told by your health care provider. This is important. Contact a health care provider if:  Your symptoms are not improving with home care. Summary  Trigger finger (stenosing tenosynovitis) causes your finger to get stuck in a bent position, and it can make it difficult and painful to straighten your finger.  This condition develops when a finger tendon or tendon sheath thickens.  Treatment starts with resting, wearing a splint, and taking NSAIDs.  In severe cases, surgery to open the tendon sheath may be needed. This information is not intended to replace advice given to you by your health care provider. Make sure you discuss any questions you have with your health care provider. Document Released: 06/01/2004 Document Revised: 07/23/2016 Document Reviewed: 07/23/2016 Elsevier Interactive Patient Education  2017 [employer]  

## 2016-11-19 ENCOUNTER — Other Ambulatory Visit: Payer: Self-pay

## 2016-11-19 NOTE — Telephone Encounter (Signed)
Spoke w/ pt about ibuprofen use, reviewed dr vinson's directions from visit, she was more reassured

## 2016-11-19 NOTE — Telephone Encounter (Signed)
Needs to speak with a nurse about meds. Please call pt back.  

## 2016-11-20 ENCOUNTER — Telehealth: Payer: Self-pay | Admitting: Student in an Organized Health Care Education/Training Program

## 2016-11-20 NOTE — Telephone Encounter (Signed)
I called to follow up on the injection from Monday to her right third flexor digit for trigger finger. She reports locking and discomfort are much improved, almost completely resolved. No other complications noted.

## 2016-12-05 NOTE — Telephone Encounter (Signed)
Helen can you please close this enounter °

## 2017-02-07 DIAGNOSIS — L089 Local infection of the skin and subcutaneous tissue, unspecified: Secondary | ICD-10-CM | POA: Diagnosis not present

## 2017-02-07 DIAGNOSIS — M542 Cervicalgia: Secondary | ICD-10-CM | POA: Diagnosis not present

## 2017-02-17 ENCOUNTER — Encounter: Payer: Self-pay | Admitting: Student in an Organized Health Care Education/Training Program

## 2017-02-17 ENCOUNTER — Ambulatory Visit (INDEPENDENT_AMBULATORY_CARE_PROVIDER_SITE_OTHER): Payer: Medicare Other | Admitting: Student in an Organized Health Care Education/Training Program

## 2017-02-17 VITALS — BP 158/59 | HR 79 | Temp 97.9°F | Ht 62.0 in | Wt 150.3 lb

## 2017-02-17 DIAGNOSIS — Z87891 Personal history of nicotine dependence: Secondary | ICD-10-CM | POA: Diagnosis not present

## 2017-02-17 DIAGNOSIS — M47812 Spondylosis without myelopathy or radiculopathy, cervical region: Secondary | ICD-10-CM | POA: Diagnosis not present

## 2017-02-17 DIAGNOSIS — N393 Stress incontinence (female) (male): Secondary | ICD-10-CM

## 2017-02-17 DIAGNOSIS — I10 Essential (primary) hypertension: Secondary | ICD-10-CM

## 2017-02-17 DIAGNOSIS — G3184 Mild cognitive impairment, so stated: Secondary | ICD-10-CM

## 2017-02-17 DIAGNOSIS — H547 Unspecified visual loss: Secondary | ICD-10-CM

## 2017-02-17 DIAGNOSIS — Z79899 Other long term (current) drug therapy: Secondary | ICD-10-CM | POA: Diagnosis not present

## 2017-02-17 MED ORDER — DONEPEZIL HCL 5 MG PO TABS: 5.0000 mg | ORAL_TABLET | Freq: Every day | ORAL | 2 refills | Status: DC

## 2017-02-17 MED ORDER — AMLODIPINE BESYLATE 10 MG PO TABS: 10.0000 mg | ORAL_TABLET | Freq: Every day | ORAL | 3 refills | Status: DC

## 2017-02-17 MED ORDER — ATENOLOL 25 MG PO TABS: 25.0000 mg | ORAL_TABLET | Freq: Every day | ORAL | 3 refills | Status: DC

## 2017-02-17 NOTE — Assessment & Plan Note (Signed)
Daughter brought memory impairments to my attention about 6 months ago. Our in office neuro psych testing is limited because the patient is blind, so unable to do the typical MMSE or mocha. Daughter is now telling me that memory impairments are more limiting the patient's functional status. She's noticing more difficulties with cooking and cleaning, self-care,  important things are being lost like wallets, keys, oven burner left on. I think the patient has either MCI or potentially mild dementia, either vascular or alzheimers. I think it's reasonable to do a trial of pharmacotherapy. I talked about it with the patient and daughter, gave them reasonable expectations. They are agreeable, though the patient was very upset with the daughter for bringing this up in the visit. Plan to start Donepezil 5mg  once daily. Reassess in 3 months.

## 2017-02-17 NOTE — Assessment & Plan Note (Signed)
Left-sided posterior neck pain seems most consistent with paraspinal muscle strain. There is no radiculopathy. Differential would also include cervical degenerative disc disease or posterior facet arthropathy. This flare is only going on for about 10 days. I advised natural course of this kind of pain can last for 2-6 weeks. Burst of prednisone from urgent care was of no benefit. Advised the patient to use ibuprofen 600 mg daily for one week. I advised activity as tolerated, I advised against neck bracing.

## 2017-02-17 NOTE — Patient Instructions (Signed)
1. For your neck pain, take Ibuprofen 600mg  twice a day. You can also take flexeril (cyclobenzaprine) 5mg  once at night to help you sleep. Keep active, try to stretch and walk. This can take 2-4 weeks to improve.   2. We will also start Aricept for your memory difficulties to see if it can help your functioning at home.

## 2017-02-17 NOTE — Assessment & Plan Note (Signed)
Much improved. Symptom is so much better that they have not followed up with gynecology as initially planned.

## 2017-02-17 NOTE — Assessment & Plan Note (Signed)
Blood pressure is elevated above goal today. However the patient did not take her antihypertensives today. Plan is to continue with amlodipine 10, lisinopril 40, and atenolol 25 mg once daily. Asked her to please take these medications on the day of her visit so I can assess her blood pressure control better.

## 2017-02-17 NOTE — Progress Notes (Signed)
Assessment and Plan:  See Encounters tab for problem-based medical decision making.   __________________________________________________________  HPI:  81 year old woman here for follow-up of hypertension. Patient reports visiting urgent care about 2 weeks ago. She has sudden onset of right hand pain and swelling. She was given a course of cephalexin and she reports the swelling has improved. Second complaints that she visited urgent care for was acute onset neck pain. Reports that the pain is at the base of her skull on the right side of her posterior neck. It's very tender to touch. She reports that her neck is very stiff. Pain does not radiate down her arms. Denies weakness or paresthesias. She was given a prednisone burst with taper which she has completed. Says that it was a very limited benefit. She thinks her pain is about stable, not getting worse or better. No fevers or chills. Eating and drinking well. Still lives independently at home but requiring more help from her daughter. Daughter reports that her memory impairment is worsening. She's now forgetting more important things, like where she left her wallet and keys. Daughter started to become worried about her mother's safety at home.  __________________________________________________________  Problem List: Patient Active Problem List   Diagnosis Date Noted  . At high risk for falls 04/15/2016    Priority: High  . Cervical osteoarthritis 02/17/2017    Priority: Medium  . Mild cognitive impairment 08/05/2016    Priority: Medium  . Stress incontinence 06/20/2016    Priority: Medium  . Calcification of aorta (HCC) 04/15/2016    Priority: Medium  . Glaucoma 04/22/2007    Priority: Medium  . Essential hypertension 04/22/2007    Priority: Medium  . Muscle cramps 10/03/2016    Priority: Low  . Hypokalemia 03/07/2016    Priority: Low  . Health care maintenance 09/21/2014    Priority: Low  . 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  Ext: Warm, 1+ bilateral pitting edema, right hand first MCP joint mild swelling and synovitis Skin: No atypical appearing moles. No rashes Neuro: alert and conversational. Normal strength in the upper extremities. Brisk upper extremities reflexes, probably 3+.

## 2017-03-21 ENCOUNTER — Other Ambulatory Visit: Payer: Self-pay | Admitting: *Deleted

## 2017-03-21 DIAGNOSIS — I10 Essential (primary) hypertension: Secondary | ICD-10-CM

## 2017-03-21 MED ORDER — DONEPEZIL HCL 5 MG PO TABS: 5.0000 mg | ORAL_TABLET | Freq: Every day | ORAL | 0 refills | Status: DC

## 2017-03-21 MED ORDER — ATENOLOL 25 MG PO TABS: 25.0000 mg | ORAL_TABLET | Freq: Every day | ORAL | 0 refills | Status: DC

## 2017-03-21 MED ORDER — POTASSIUM CHLORIDE CRYS ER 20 MEQ PO TBCR: 40.0000 meq | EXTENDED_RELEASE_TABLET | Freq: Every day | ORAL | 0 refills | Status: DC

## 2017-03-21 MED ORDER — LISINOPRIL 40 MG PO TABS: 40.0000 mg | ORAL_TABLET | Freq: Every day | ORAL | 0 refills | Status: DC

## 2017-03-21 MED ORDER — AMLODIPINE BESYLATE 10 MG PO TABS: 10.0000 mg | ORAL_TABLET | Freq: Every day | ORAL | 0 refills | Status: DC

## 2017-03-21 NOTE — Telephone Encounter (Signed)
Not[facility] patient's dtr [facility]

## 2017-03-21 NOTE — Telephone Encounter (Signed)
Per daughter - patient's house is infested with fleas & was evacuated. Unable to find patient's meds. She gets them from mail order pharmacy. She has not been taking them for 3 days now. Appt w/ pcp 8/13. [facility]

## 2017-04-07 ENCOUNTER — Encounter: Payer: Self-pay | Admitting: Student in an Organized Health Care Education/Training Program

## 2017-04-07 ENCOUNTER — Ambulatory Visit (INDEPENDENT_AMBULATORY_CARE_PROVIDER_SITE_OTHER): Payer: Medicare Other | Admitting: Student in an Organized Health Care Education/Training Program

## 2017-04-07 VITALS — BP 156/72 | HR 65 | Temp 97.7°F | Ht 62.0 in | Wt 149.8 lb

## 2017-04-07 DIAGNOSIS — I10 Essential (primary) hypertension: Secondary | ICD-10-CM

## 2017-04-07 DIAGNOSIS — M47812 Spondylosis without myelopathy or radiculopathy, cervical region: Secondary | ICD-10-CM

## 2017-04-07 DIAGNOSIS — Z79899 Other long term (current) drug therapy: Secondary | ICD-10-CM

## 2017-04-07 DIAGNOSIS — K2970 Gastritis, unspecified, without bleeding: Secondary | ICD-10-CM

## 2017-04-07 DIAGNOSIS — I700 Atherosclerosis of aorta: Secondary | ICD-10-CM

## 2017-04-07 DIAGNOSIS — G3184 Mild cognitive impairment, so stated: Secondary | ICD-10-CM | POA: Diagnosis not present

## 2017-04-07 DIAGNOSIS — Z87891 Personal history of nicotine dependence: Secondary | ICD-10-CM

## 2017-04-07 MED ORDER — CLONIDINE HCL 0.1 MG PO TABS: 0.1000 mg | ORAL_TABLET | Freq: Two times a day (BID) | ORAL | 11 refills | Status: DC

## 2017-04-07 MED ORDER — TRAMADOL HCL 50 MG PO TABS: 50.0000 mg | ORAL_TABLET | Freq: Every day | ORAL | 0 refills | Status: DC | PRN

## 2017-04-07 NOTE — Patient Instructions (Signed)
1. Your blood pressure is still above goal. We will add a new medicine, Clonidine, take one tablet twice a day. Call me if you have any problems with this medication. Please keep a log of your blood pressures at home.   2. We will try Tramadol for your neck pain. Please take one tablet only on the bad days to help keep you moving and active.

## 2017-04-07 NOTE — Assessment & Plan Note (Signed)
Patient with increasing pain of her neck, especially on the left side around the paraspinal muscles. There is no radicular pain and she has no weakness of her upper extremities. There are occasions when the pain does limit her functional status. I'll think she needs advanced imaging like MRI right now. She has no red flag symptoms. She finds some benefit with Tylenol, unable to take NSAIDs because of gastritis. Plan is to try tramadol 50 mg as needed, I provided #30 tabs to last about 6 weeks. Follow-up then to check on her progress.

## 2017-04-07 NOTE — Assessment & Plan Note (Signed)
Mild cognitive impairment is stable. She is tolerating donepezil well with no side effects. Plan is to continue with donepezil 5 mg once daily.

## 2017-04-07 NOTE — Assessment & Plan Note (Signed)
Patient does have atherosclerotic disease seen on CT imaging in 2017. Today she endorsed increasing pain in both of her legs, sometimes with activity. We completed in office ABIs of the lower extremities, left 1.10 and her right 1.10, essentially normal making major vessel atherosclerotic disease unlikely.we will continue with managing her septal hypertension, however I don't believe she will be able to tolerate statin therapy because of her frequent myalgias and she does not want to take aspirin for primary prevention due to frequent gastritis.

## 2017-04-07 NOTE — Assessment & Plan Note (Signed)
Blood pressure is consistently elevated the last few visits. She reports much better compliance with blood pressure medications, didn't take them today. She is unable to tolerate diuretics because of stress incontinence. Plan is to continue with amlodipine, lisinopril, and atenolol, which are all at maximum doses. At this point and add clonidine 0.1 mg twice a day. I talked to her about potential side effects and she understands. Also asked her to check home blood pressure readings, write them down, and bring them to her next visit. Follow-up in 6 weeks.

## 2017-04-07 NOTE — Progress Notes (Signed)
   Assessment and Plan:  See Encounters tab for problem-based medical decision making.   __________________________________________________________  HPI:   81 year old woman here for follow-up of hypertension. The patient reports doing fairly well, with prompting from her daughter she does admit to having increasing neck pain over the last few months. Daughter reports that the patient is having significant pain almost daily. They say that the pain is limiting her functional abilities. Daughter is interested in her having a neck brace because she thinks she might do better with more support. No recent falls or other trauma. She's had neck pain on and off for many years. Patient takes Tylenol with some benefit. She unable to take nonsteroidal anti-inflammatories because of recurrent gastritis. Denies any fevers or chills. No personal history of cancer. She reports good compliance with her other medications. Since her last visit she has started with donepezil for early signs of dementia. There reports that her memory is stable. Denies any adverse side effects. She lives with her daughter and is doing well otherwise.  __________________________________________________________  Problem List: Patient Active Problem List   Diagnosis Date Noted  . At high risk for falls 04/15/2016    Priority: High  . Cervical osteoarthritis 02/17/2017    Priority: Medium  . Mild cognitive impairment 08/05/2016    Priority: Medium  . Stress incontinence 06/20/2016    Priority: Medium  . Calcification of aorta (HCC) 04/15/2016    Priority: Medium  . Glaucoma 04/22/2007    Priority: Medium  . Essential hypertension 04/22/2007    Priority: Medium  . Muscle cramps 10/03/2016    Priority: Low  . Health care maintenance 09/21/2014    Priority: Low  . Blindness of right eye with low vision in contralateral eye 06/27/2011    Priority: Low    Medications: Reconciled today in  Epic __________________________________________________________  Physical Exam:  Vital Signs: Vitals:   04/07/17 0910 04/07/17 1024  BP: (!) 172/59 (!) 156/72  Pulse: 69 65  Temp: 97.7 F (36.5 C)   TempSrc: Oral   SpO2: 100%   Weight: 149 lb 12.8 oz (67.9 kg)   Height: 5\' 2"  (1.575 m)     Gen: Well appearing, NAD Neck: No cervical LAD, No thyromegaly or nodules, No JVD. CV: RRR, no murmurs Pulm: Normal effort, CTA throughout, no wheezing Ext: Warm, no edema, normal joints, Mild tenderness at the lateral thighs, difficult tenderness at the left cervical paraspinal muscles. Skin: No atypical appearing moles. No rashes

## 2017-04-24 ENCOUNTER — Other Ambulatory Visit: Payer: Self-pay | Admitting: Student in an Organized Health Care Education/Training Program

## 2017-04-24 NOTE — Telephone Encounter (Signed)
Needs refill lisinapril 40mg , to [facility] on [address].

## 2017-05-01 ENCOUNTER — Other Ambulatory Visit: Payer: Self-pay | Admitting: Student in an Organized Health Care Education/Training Program

## 2017-05-01 ENCOUNTER — Other Ambulatory Visit: Payer: Self-pay | Admitting: Internal Medicine

## 2017-05-07 ENCOUNTER — Other Ambulatory Visit: Payer: Self-pay | Admitting: *Deleted

## 2017-05-07 MED ORDER — TRAMADOL HCL 50 MG PO TABS: 50.0000 mg | ORAL_TABLET | Freq: Every day | ORAL | 0 refills | Status: DC | PRN

## 2017-05-07 NOTE — Telephone Encounter (Signed)
[facility] to [facility] (pharmacy verified with patient).

## 2017-05-07 NOTE — Telephone Encounter (Signed)
Pain generator is cervical spine OA, I started tramadol in August. She will see me on 9/24. Ok to call in a refill of tramadol to last until that appointment.

## 2017-05-16 ENCOUNTER — Other Ambulatory Visit: Payer: Self-pay | Admitting: *Deleted

## 2017-05-16 MED ORDER — LISINOPRIL 40 MG PO TABS: 40.0000 mg | ORAL_TABLET | Freq: Every day | ORAL | 2 refills | Status: DC

## 2017-05-16 NOTE — Telephone Encounter (Signed)
Received faxed request from [facility]- Lisinopril recently authorized for 30day supply with 5 refills, pharmacy is requesting a new rx for 90-day supply.  They are also requesting 90-day supply on [employer] 20 meq.  Potassium is no longer on medication profile, will send both requests to pcp for review, please advise.Diamond Spittle Cassady9/21/201810:19 AM

## 2017-05-19 ENCOUNTER — Ambulatory Visit (INDEPENDENT_AMBULATORY_CARE_PROVIDER_SITE_OTHER): Payer: Medicare Other | Admitting: Student in an Organized Health Care Education/Training Program

## 2017-05-19 VITALS — BP 150/70 | HR 61 | Temp 97.6°F | Ht 62.0 in | Wt 149.3 lb

## 2017-05-19 DIAGNOSIS — M47812 Spondylosis without myelopathy or radiculopathy, cervical region: Secondary | ICD-10-CM

## 2017-05-19 DIAGNOSIS — N393 Stress incontinence (female) (male): Secondary | ICD-10-CM

## 2017-05-19 DIAGNOSIS — Z87891 Personal history of nicotine dependence: Secondary | ICD-10-CM

## 2017-05-19 DIAGNOSIS — G3184 Mild cognitive impairment, so stated: Secondary | ICD-10-CM

## 2017-05-19 DIAGNOSIS — Z79899 Other long term (current) drug therapy: Secondary | ICD-10-CM

## 2017-05-19 DIAGNOSIS — I10 Essential (primary) hypertension: Secondary | ICD-10-CM | POA: Diagnosis not present

## 2017-05-19 DIAGNOSIS — Z87448 Personal history of other diseases of urinary system: Secondary | ICD-10-CM

## 2017-05-19 DIAGNOSIS — Z23 Encounter for immunization: Secondary | ICD-10-CM

## 2017-05-19 NOTE — Patient Instructions (Signed)
1. Please take all your medications as listed here.   2. I want to see you again in 6 weeks to check on your blood pressure.   3. We gave you the contact number for the Gynecology clinic for your frequent urge to urinate.

## 2017-05-19 NOTE — Assessment & Plan Note (Signed)
Blood pressure is elevated today, though patient doesn't think she took her medications this morning. At our last visit I wanted to start clonidine, however this was never filled from her pharmacy. We talked about our goals with controlling her hypertension, especially the weight it impacts her mild dementia. Plan is to continue with amlodipine 10, lisinopril 40, and atenolol 25 mg daily. We will add clonidine 0.1 mg twice a day, patient and daughter are in agreement.

## 2017-05-19 NOTE — Assessment & Plan Note (Signed)
Patient with a history of stress incontinence, we discontinued diuretics, and then when it persisted we referred her to urogynecology. However back in April the problem spontaneously resolved and she canceled her appointment with gynecology. She did well up until last month when her symptoms returned. Urinalysis today ruled out urinary tract infection. No medications that would cause urinary urgency as a side effect. Plan is to complete the referral to urogynecology for consideration of Pessary device.

## 2017-05-19 NOTE — Progress Notes (Signed)
   Assessment and Plan:  See Encounters tab for problem-based medical decision making.   __________________________________________________________  HPI:   81 year old woman here for follow-up of hypertension. Her last visit we talked about starting clonidine, however she never picked this up from the pharmacy. She does not take her blood pressures at home. Reports good compliance with her other medications. Denies headaches, chest pain, dyspnea on exertion, or PND. She reports that the cervical neck pain that improved spontaneously about a month ago. She had about a week free from symptoms, then spontaneously the pain returned about a week ago. She has been using tramadol as needed for the pain, which she says is effective. She uses about 4 tabs weekly. Denies any falls. The neck pain radiates to her head and gives her a headache. Does not wake her up from sleep. She is asking again about a neck brace. No fevers or chills.   __________________________________________________________  Problem List: Patient Active Problem List   Diagnosis Date Noted  . At high risk for falls 04/15/2016    Priority: High  . Cervical osteoarthritis 02/17/2017    Priority: Medium  . Mild cognitive impairment 08/05/2016    Priority: Medium  . Stress incontinence 06/20/2016    Priority: Medium  . Calcification of aorta (HCC) 04/15/2016    Priority: Medium  . Glaucoma 04/22/2007    Priority: Medium  . Essential hypertension 04/22/2007    Priority: Medium  . Muscle cramps 10/03/2016    Priority: Low  . Health care maintenance 09/21/2014    Priority: Low  . Blindness of right eye with low vision in contralateral eye 06/27/2011    Priority: Low    Medications: Reconciled today in Epic __________________________________________________________  Physical Exam:  Vital Signs: Vitals:   05/19/17 0927 05/19/17 0929  BP: (!) 173/64 (!) 150/70  Pulse: 61   Temp: 97.6 F (36.4 C)   TempSrc: Oral     SpO2: 99%   Weight: 149 lb 4.8 oz (67.7 kg)   Height:  (1.575 m)     Gen: Well appearing, NAD Neck: No cervical LAD, No thyromegaly or nodules, No JVD. CV: RRR, no murmurs Pulm: Normal effort, CTA throughout, no wheezing Ext: Warm, no edema, normal joints Skin: No atypical appearing moles. No rashes

## 2017-05-19 NOTE — Assessment & Plan Note (Signed)
Symptomatically stable, daughter tells me that the patient's memory may be improving slightly. Plan is to continue with donepezil 5 mg daily. We will also talk to the importance of good blood pressure control for her long-term functional status.

## 2017-05-19 NOTE — Assessment & Plan Note (Addendum)
Clinically stable, this is an intermittent problem with up's and downs. Currently it's not function limiting, and she is tolerating tramadol very well. Uses 50 mg about 3-4 times per week. No red flag symptoms. On exam she has point tenderness over her left trapezius radiating up her scalp. I think this represents cervical spine osteoarthritis with paraspinal muscle strain and tendinopathy. Plan is to continue with as needed tramadol, patient will me know if symptoms progress. She has just enough tramadol to make our 6 week follow up, if she runs out early we can call in a refill.

## 2017-06-30 ENCOUNTER — Telehealth: Payer: Self-pay | Admitting: *Deleted

## 2017-06-30 ENCOUNTER — Ambulatory Visit (INDEPENDENT_AMBULATORY_CARE_PROVIDER_SITE_OTHER): Payer: Medicare Other | Admitting: Student in an Organized Health Care Education/Training Program

## 2017-06-30 ENCOUNTER — Encounter: Payer: Self-pay | Admitting: Student in an Organized Health Care Education/Training Program

## 2017-06-30 VITALS — BP 140/70 | HR 56 | Temp 97.6°F | Ht 62.0 in | Wt 145.2 lb

## 2017-06-30 DIAGNOSIS — Z970 Presence of artificial eye: Secondary | ICD-10-CM

## 2017-06-30 DIAGNOSIS — Z87891 Personal history of nicotine dependence: Secondary | ICD-10-CM

## 2017-06-30 DIAGNOSIS — Z79899 Other long term (current) drug therapy: Secondary | ICD-10-CM | POA: Diagnosis not present

## 2017-06-30 DIAGNOSIS — R51 Headache: Secondary | ICD-10-CM | POA: Diagnosis not present

## 2017-06-30 DIAGNOSIS — G44209 Tension-type headache, unspecified, not intractable: Secondary | ICD-10-CM

## 2017-06-30 DIAGNOSIS — I10 Essential (primary) hypertension: Secondary | ICD-10-CM | POA: Diagnosis not present

## 2017-06-30 DIAGNOSIS — N393 Stress incontinence (female) (male): Secondary | ICD-10-CM

## 2017-06-30 DIAGNOSIS — M47812 Spondylosis without myelopathy or radiculopathy, cervical region: Secondary | ICD-10-CM | POA: Diagnosis not present

## 2017-06-30 DIAGNOSIS — Z79891 Long term (current) use of opiate analgesic: Secondary | ICD-10-CM | POA: Diagnosis not present

## 2017-06-30 DIAGNOSIS — R519 Headache, unspecified: Secondary | ICD-10-CM | POA: Insufficient documentation

## 2017-06-30 MED ORDER — CLONIDINE HCL 0.1 MG PO TABS: 0.1000 mg | ORAL_TABLET | Freq: Two times a day (BID) | ORAL | 2 refills | Status: DC

## 2017-06-30 NOTE — Progress Notes (Signed)
   Assessment and Plan:  See Encounters tab for problem-based medical decision making.   __________________________________________________________  HPI:   81 year old woman here today for follow-up of blood pressure.  She also has an acute complaint of a tight sensation on the left occipital part of her scalp and head.  She first describes it as if there were worms crawling inside her head.  She says that it is painful, kind of like a headache.  Comes on spontaneously, last several minutes.  She lays down in a quiet room and it goes away in less than an hour.  Happens several times a week.  Denies nausea, photophobia, phonophobia, or significant disability from it.  Has had these kinds of headaches for several months.  Daughter is very worried about it.  She says that she noticed a few minutes of slurred speech a couple weeks ago.  She wanted mom to come into the emergency department, but the mother would not comply.  Denies any other changes to her medications.  Uses tramadol sparingly for cervical osteoarthritis.  Also received a course of oxycodone in October from her podiatrist.  Does not take his medications every day.  Has had some significant stress in her life recently, she was forced to move to a small apartment.  Currently she lives with her granddaughter who helps her with many activities of daily living.  Denies any chest pain, fevers, chills, shortness of breath.  Headache does not wake her up from sleep.  No other recent trips to the emergency department or hospitalizations.  __________________________________________________________  Problem List: Patient Active Problem List   Diagnosis Date Noted  . Tension headache 06/30/2017    Priority: High  . At high risk for falls 04/15/2016    Priority: High  . Cervical osteoarthritis 02/17/2017    Priority: Medium  . Mild cognitive impairment 08/05/2016    Priority: Medium  . Stress incontinence 06/20/2016    Priority: Medium  .  Calcification of aorta (HCC) 04/15/2016    Priority: Medium  . Glaucoma 04/22/2007    Priority: Medium  . Essential hypertension 04/22/2007    Priority: Medium  . Muscle cramps 10/03/2016    Priority: Low  . Health care maintenance 09/21/2014    Priority: Low  . Blindness of right eye with low vision in contralateral eye 06/27/2011    Priority: Low    Medications: Reconciled today in Epic __________________________________________________________  Physical Exam:  Vital Signs: Vitals:   06/30/17 0827 06/30/17 0859  BP: (!) 159/63 140/70  Pulse: (!) 56   Temp: 97.6 F (36.4 C)   TempSrc: Oral   SpO2: 98%   Weight: 145 lb 3.2 oz (65.9 kg)   Height: 5\' 2"  (1.575 m)     Gen: Well appearing, NAD Eyes: right eye is artificial, left eye has normal movements CV: RRR, no murmurs Pulm: Normal effort, CTA throughout, no wheezing Ext: Warm, no edema, normal joints Skin: No atypical appearing moles. No rashes Neuro: Alert, conversational, full strength in the upper and lower extremities, cranial nerves are intact throughout, 2+ brisk reflexes upper and lower extremities.

## 2017-06-30 NOTE — Telephone Encounter (Signed)
I spoke with patient and her daughter over the phone. They called to tell me that she had another "episode" of head tightening, lasted for 2-3 minutes, now resolved. I told them it sounded like a headache, and Ms. [name] agreed with the caveat that it felt different. Her daughter was very anxious, saying that this is not normal, and something has to be done. We talked about the indication for an MRI brain, I don't see any red flag symptoms, and it doesn't fit with the typical presentation of a brain tumor, stroke, or MS. We talked about the difficulty of getting approval for an MRI when it is done for headache. I also offered her referral to neurology which may be better able to fit these symptoms with a treatment plan. They declined that referral.  We settled on keeping a headache diary for the next one week, recording date, timing, and associated symptoms of these headaches so I could better assess for high risk features. I asked her to call me back next Monday afternoon and we can go over it over the phone. They were agreeable to this plan.

## 2017-06-30 NOTE — Assessment & Plan Note (Signed)
Blood pressure historically difficult to control because of mild noncompliance.  I prescribed clonidine in August, but the patient has not yet started this medication.  I asked if she had any questions or concerns, she said no and that she is planning on starting after today's visit.  Her blood pressure is still elevated.  We talked about the importance of blood pressure control especially for stroke prevention.  Plan to continue amlodipine 10 mg, lisinopril 40 mg, atenolol 25 mg, and start clonidine 0.1 mg twice daily.  We cannot use diuretics because of significant stress urinary incontinence. Hyperaldo previously ruled out with serum aldo levels. Follow up in four weeks for BP recheck.

## 2017-06-30 NOTE — Telephone Encounter (Signed)
Daughter calling, transfer to dr Oswaldo Donevincent

## 2017-06-30 NOTE — Assessment & Plan Note (Signed)
New problem.  I think the way she is describing this sensation of pain in the left side of her head is most consistent with a tension type headache.  They have been self-limited, lasting  less than an hour and occurring 2-3 times weekly.  Could be related to her known cervical spine arthritis.  Many features of her history are atypical, and some features are high risk including transient slurred speech.  I reassured her that her neurologic exam is totally normal today.  We talked about conservative management of tension type headaches, I advised NSAIDs as needed. We discussed indications for MRI, and decided to defer that for now, but if she has more symptoms at home I think we should do the MRI to rule out CVA or demyelinating disease. Patient and her daughter understand and agree.

## 2017-06-30 NOTE — Assessment & Plan Note (Signed)
Symptomatically stable.  Plan to continue tramadol 50 mg as needed, she only uses 2-3 tablets/week.

## 2017-07-31 ENCOUNTER — Ambulatory Visit (INDEPENDENT_AMBULATORY_CARE_PROVIDER_SITE_OTHER): Payer: Medicare Other | Admitting: Internal Medicine

## 2017-07-31 ENCOUNTER — Ambulatory Visit (HOSPITAL_COMMUNITY)
Admission: RE | Admit: 2017-07-31 | Discharge: 2017-07-31 | Disposition: A | Discharge status: Home / Self Care | Payer: Medicare Other | Source: Ambulatory Visit | Attending: Internal Medicine | Admitting: Internal Medicine

## 2017-07-31 VITALS — BP 151/63 | HR 80 | Temp 98.6°F | Ht 62.0 in | Wt 144.2 lb

## 2017-07-31 DIAGNOSIS — E785 Hyperlipidemia, unspecified: Secondary | ICD-10-CM | POA: Diagnosis not present

## 2017-07-31 DIAGNOSIS — M40209 Unspecified kyphosis, site unspecified: Secondary | ICD-10-CM

## 2017-07-31 DIAGNOSIS — H409 Unspecified glaucoma: Secondary | ICD-10-CM | POA: Diagnosis not present

## 2017-07-31 DIAGNOSIS — I700 Atherosclerosis of aorta: Secondary | ICD-10-CM | POA: Insufficient documentation

## 2017-07-31 DIAGNOSIS — M545 Low back pain, unspecified: Secondary | ICD-10-CM

## 2017-07-31 DIAGNOSIS — M25551 Pain in right hip: Secondary | ICD-10-CM | POA: Diagnosis not present

## 2017-07-31 DIAGNOSIS — Z87891 Personal history of nicotine dependence: Secondary | ICD-10-CM

## 2017-07-31 DIAGNOSIS — M4316 Spondylolisthesis, lumbar region: Secondary | ICD-10-CM | POA: Diagnosis not present

## 2017-07-31 DIAGNOSIS — I10 Essential (primary) hypertension: Secondary | ICD-10-CM

## 2017-07-31 MED ORDER — CYCLOBENZAPRINE HCL 5 MG PO TABS: 5.0000 mg | ORAL_TABLET | Freq: Three times a day (TID) | ORAL | 0 refills | Status: AC | PRN

## 2017-07-31 MED ORDER — MELOXICAM 7.5 MG PO TABS: 7.5000 mg | ORAL_TABLET | Freq: Every day | ORAL | 0 refills | Status: DC

## 2017-07-31 NOTE — Progress Notes (Signed)
   CC: R low back back and hip pain  HPI:  [name] is a 81 y.o. with PMH of HTN, HLD, and glaucoma who presents for evaluation of R sided low back pain that radiates to her R hip/groin. Patient states that she experiences some pain throughout her body in her lower extremities (mainly bilateral thighs), neck and R side of her head, however this pain in her back is new and worse than other pains she has felt before. She cannot recall a specific event that caused this pain to start. She denies trauma or fall. She states that the pain is constant and impedes her ability to walk and move around the house. It starts in her lower back and radiates to her hip whenever she moves. She has tried taking ibuprofen and Tylenol for the pain, but that has not helped. She has tried taking Tramadol for the pain, but she states this only lasts for one hour and that it doesn't help her too much. She denies radiation of pain down her leg, fevers, loss of bowel/bladder continence, numbness/tingling in lower extremities, and focal weakness in lower extremities.  Past Medical History: Past Medical History:  Diagnosis Date  . 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 Left eye sees shadows and is bothered by bright lights. She wears specialty sunglasses which help her with sensitivity.   [name]. CAD (coronary artery disease)    nonobstructive, myoview normal 03/2007, done by Dr. Daleen SquibbWall - EF = 75%  . Glaucoma   . HLD (hyperlipidemia)   . Hypertension    Review of Systems:   Patient endorses low back pain, as per HPI Patient denies chest pain, shortness of breath, abdominal pain, diaphoresis, nausea/vomiting, lower extremity swelling, and change in bowel/bladder habits.  Physical Exam:  Vitals:   07/31/17 1412  BP: (!) 151/63  Pulse: 80  Temp: 98.6 F (37 C)  TempSrc: Oral  SpO2: 99%    Weight: 144 lb 3.2 oz (65.4 kg)  Height: 5\' 2"  (1.575 m)   Physical Exam  Constitutional:  Thin, frail appearing woman wearing glasses sitting uncomfortably on exam table in no acute distress  Cardiovascular: Normal rate, regular rhythm and intact distal pulses. Exam reveals no friction rub.  No murmur heard. Respiratory: Effort normal. No respiratory distress. She has no wheezes.  No crackles  GI: Soft. Bowel sounds are normal. She exhibits no distension. There is no tenderness. There is no rebound.  Musculoskeletal:  Patient able to ambulate onto exam table independently without difficulty. Pain limited exam and patient unable to lay flat on exam table 2/2 pain. Pain with palpation of muscles of R lower back, hip, and groin. No point tenderness in vertebral column.   Skin: Skin is warm and dry. No rash noted. No erythema.   Assessment & Plan:   See Encounters Tab for problem based charting.  Patient seen with Dr. Heide SparkNarendra.

## 2017-07-31 NOTE — Patient Instructions (Addendum)
FOLLOW-UP INSTRUCTIONS When: 08/11/2017 For: Low back pain reevaluation What to bring: Prescription medication  Thank you for seeing us in the clinic today!  You were evaluated for low back pain. To ensure that your pain is not a result of a fracture, we ordered imaging studies. I will call you with the results of these imaging studies if a fracture is shown. In the meantime please take cyclobenzaprine (a muscle relaxer) up to 3 times a day as needed for pain in your lower back. This medication can make you sleepy, so please take it before bedtime. You were also given a medication called meloxicam to help with inflammation. Please take this medication once a day for 5 days. Take this medication with food, as it can upset your stomach.  Please return to the clinic in 1-2 weeks for follow up of your low back pain. If your symptoms worsen or if new worrisome symptoms develop please use instructions below to contact the clinic.   If you have any questions or concerns, please call our clinic at [phone] between the hours of 9am-5pm. If you have a problem after these hours, please call [phone] and ask for the internal medicine resident on call. If you feel you are having a medical emergency please call 911.   Thanks, Dr. Jeanella FlatteryMarybeth Yizel Canby

## 2017-07-31 NOTE — Assessment & Plan Note (Addendum)
Patient presents with acute onset lower back pain which is localized to R side and radiates through R hip/groin. Pain located mainly in paraspinous region, without pain in vertebral column. Given this, her PE is most consistent muscle strain/spasm. Patient has mild kyphosis on exam, which she states is new for her. Patient's last DEXA scan was not able to be obtained. She does not have red flag symptoms for imaging, including fever, personal hx of cancer, or point tenderness on exam, but given her age and significant acute change it is possible that she has a compression fracture that causing her symptoms. Will evaluate for this with lumbar spine imaging and treat for muscle spasm with NSAID and muscle relaxant as discussed below. Will also obtain BMP as patient has history of muscle cramps to check for electrolyte abnormalities.   Plan: -Meloxicam, 7.5 mg daily x5 days, discontinue ibuprofen during this time -Cyclobenzaprine, 5 mg TID PRN x5 days -Follow up BMP -Lumbar spine Xray to evaluate for fracture -Continue use of tramadol PRN -Follow up with PCP as scheduled or sooner if symptoms continue to worsen

## 2017-08-01 LAB — BMP8+ANION GAP
ANION GAP: 19 mmol/L — AB (ref 10.0–18.0)
BUN/Creatinine Ratio: 12 (ref 12–28)
BUN: 11 mg/dL (ref 8–27)
CALCIUM: 9.4 mg/dL (ref 8.7–10.3)
CO2: 22 mmol/L (ref 20–29)
Chloride: 102 mmol/L (ref 96–106)
Creatinine, Ser: 0.93 mg/dL (ref 0.57–1.00)
GFR calc Af Amer: 68 mL/min/{1.73_m2} (ref >59)
GFR calc non Af Amer: 59 mL/min/{1.73_m2} — ABNORMAL LOW (ref >59)
Glucose: 119 mg/dL — ABNORMAL HIGH (ref 65–99)
POTASSIUM: 3.1 mmol/L — AB (ref 3.5–5.2)
SODIUM: 143 mmol/L (ref 134–144)

## 2017-08-11 ENCOUNTER — Ambulatory Visit (INDEPENDENT_AMBULATORY_CARE_PROVIDER_SITE_OTHER): Payer: Medicare Other | Admitting: Student in an Organized Health Care Education/Training Program

## 2017-08-11 ENCOUNTER — Other Ambulatory Visit: Payer: Self-pay

## 2017-08-11 ENCOUNTER — Encounter: Payer: Self-pay | Admitting: Student in an Organized Health Care Education/Training Program

## 2017-08-11 VITALS — BP 147/77 | HR 61 | Temp 97.6°F | Ht 62.0 in | Wt 141.4 lb

## 2017-08-11 DIAGNOSIS — M4712 Other spondylosis with myelopathy, cervical region: Secondary | ICD-10-CM

## 2017-08-11 DIAGNOSIS — Z79899 Other long term (current) drug therapy: Secondary | ICD-10-CM | POA: Diagnosis not present

## 2017-08-11 DIAGNOSIS — N393 Stress incontinence (female) (male): Secondary | ICD-10-CM

## 2017-08-11 DIAGNOSIS — M549 Dorsalgia, unspecified: Secondary | ICD-10-CM | POA: Diagnosis not present

## 2017-08-11 DIAGNOSIS — M47812 Spondylosis without myelopathy or radiculopathy, cervical region: Secondary | ICD-10-CM

## 2017-08-11 DIAGNOSIS — R4781 Slurred speech: Secondary | ICD-10-CM

## 2017-08-11 DIAGNOSIS — F40240 Claustrophobia: Secondary | ICD-10-CM | POA: Diagnosis not present

## 2017-08-11 DIAGNOSIS — R51 Headache: Secondary | ICD-10-CM | POA: Diagnosis not present

## 2017-08-11 DIAGNOSIS — I10 Essential (primary) hypertension: Secondary | ICD-10-CM | POA: Diagnosis not present

## 2017-08-11 DIAGNOSIS — Z87891 Personal history of nicotine dependence: Secondary | ICD-10-CM

## 2017-08-11 MED ORDER — LORAZEPAM 1 MG PO TABS: 1.0000 mg | ORAL_TABLET | Freq: Once | ORAL | 0 refills | Status: AC

## 2017-08-11 MED ORDER — CLONIDINE HCL 0.2 MG PO TABS: 0.2000 mg | ORAL_TABLET | Freq: Two times a day (BID) | ORAL | 5 refills | Status: DC

## 2017-08-11 MED ORDER — TRAMADOL HCL 50 MG PO TABS: 50.0000 mg | ORAL_TABLET | Freq: Every day | ORAL | 0 refills | Status: DC | PRN

## 2017-08-11 NOTE — Assessment & Plan Note (Signed)
Blood pressure is still mildly above goal today.  Plan is to increase clonidine from 0.1 to 0.2 mg twice daily.  Continue lisinopril 40 mg, amlodipine 10 mg, and atenolol 25 mg daily.  Patient is intolerant of diuretics because of severe stress incontinence.  Follow-up blood pressure in 6 weeks.

## 2017-08-11 NOTE — Patient Instructions (Signed)
1.  For your headache and slurred speech we are going to do an MRI of the brain.  If you do not hear from the schedulers about arranging this by the end of this week, please call our office.  2.  For your neck pain, which may also be contributing to the head ache, I want to do an MRI of the neck at the same time as the brain.  3.  Your blood pressure is better, but still above goal.  Please increase the clonidine to 0.2 mg twice a day.  I have sent a new prescription to [facility]  4.  I have sent a refill of tramadol to [facility] to be used as needed for neck and low back pain.

## 2017-08-11 NOTE — Progress Notes (Signed)
   Assessment and Plan:  See Encounters tab for problem-based medical decision making.   __________________________________________________________  HPI:   81 year old woman who is here for follow-up of hypertension and headaches.  My saw her last month she had some unusual new complaints of a tight and crawling-like sensation on the right side of her head.  She describes it like intermittent feeling of wounds crawling on and inside her scalp.  Also endorses a tightness around her head consistent with headache.  Would last for 1-2 hours and occurred 2-3 times a week.  Over the last month she reports that that feeling has improved.  She is not able to keep a headache diary for me.  She says it may have only happened once yesterday.  She said she was also feeling intermittent slurred speech, this also happened again yesterday and lasted a few hours.  Said she had difficulty getting words out and her daughter says that she was difficult to understand.  No fevers or chills.  No chest pain.  She was seen once at the acute care clinic last week for a flare of low back pain.  She had a plain film x-ray of the lumbar spine which was unrevealing.  She was treated with meloxicam with good improvement in her symptoms.  She continues to live with her granddaughter.  Has a lot of family in the area that support her.  She is independent in all her activities of daily living.  __________________________________________________________  Problem List: Patient Active Problem List   Diagnosis Date Noted  . Slurred speech 06/30/2017    Priority: High  . At high risk for falls 04/15/2016    Priority: High  . Cervical osteoarthritis 02/17/2017    Priority: Medium  . Mild cognitive impairment 08/05/2016    Priority: Medium  . Stress incontinence 06/20/2016    Priority: Medium  . Calcification of aorta (HCC) 04/15/2016    Priority: Medium  . Glaucoma 04/22/2007    Priority: Medium  . Essential hypertension  04/22/2007    Priority: Medium  . Muscle cramps 10/03/2016    Priority: Low  . Health care maintenance 09/21/2014    Priority: Low  . 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 No rashes Neuro: Alert, oriented, conversational.  Full strength in the upper and lower extremities.  3+ hyperreflexic right brachial and right patellar reflexes.  Normal reflexes on the left side.

## 2017-08-11 NOTE — Assessment & Plan Note (Signed)
Patient continues to have intermittent neck pain, and now some strange sensations of what sounds like muscle spasms radiating up through her scalp which I am suspicious her due to cervical spine osteoarthritis.  On exam she has hyperreflexia, especially on right upper and lower extremities putting her at risk for having a upper motor neuron lesion, and I am suspicious for cervical canal stenosis either from arthritis or herniated disc.  Patient feels like the symptoms are now function limiting. Plan is to order MRI cervical spine and potentially surgical consultation depending on the results.

## 2017-08-11 NOTE — Assessment & Plan Note (Addendum)
I have been following her for unusual episodes of scalp tightness accompanied by slurred speech for about 2 months.  Patient had another spontaneous episode of slurred speech that lasted a few hours yesterday.  Only one episode like this in the last month.  She is at risk for ischemic vascular disease given her long-standing hypertension and advanced age.  Plan will be to rule out stroke with MRI brain.  We are going to increase clonidine for better blood pressure control today.  Will hold off on antiplatelets for now, currently patient is intolerant to statin therapy because of myalgias.  She reports that she is going to need a sedative for the MRI because of claustrophobia.  I will call in a 1 mg dose of Ativan to be given prior to the MRI.

## 2017-08-22 ENCOUNTER — Ambulatory Visit (HOSPITAL_COMMUNITY)
Admission: RE | Admit: 2017-08-22 | Discharge: 2017-08-22 | Disposition: A | Discharge status: Home / Self Care | Payer: Medicare Other | Source: Ambulatory Visit | Attending: Student in an Organized Health Care Education/Training Program | Admitting: Student in an Organized Health Care Education/Training Program

## 2017-08-22 DIAGNOSIS — M47812 Spondylosis without myelopathy or radiculopathy, cervical region: Secondary | ICD-10-CM | POA: Diagnosis not present

## 2017-08-22 DIAGNOSIS — M4712 Other spondylosis with myelopathy, cervical region: Secondary | ICD-10-CM | POA: Diagnosis present

## 2017-08-22 DIAGNOSIS — M4802 Spinal stenosis, cervical region: Secondary | ICD-10-CM | POA: Diagnosis not present

## 2017-08-22 DIAGNOSIS — M5030 Other cervical disc degeneration, unspecified cervical region: Secondary | ICD-10-CM | POA: Diagnosis not present

## 2017-08-22 DIAGNOSIS — R4781 Slurred speech: Secondary | ICD-10-CM | POA: Diagnosis not present

## 2017-08-22 DIAGNOSIS — G319 Degenerative disease of nervous system, unspecified: Secondary | ICD-10-CM | POA: Diagnosis not present

## 2017-08-27 ENCOUNTER — Telehealth: Payer: Self-pay | Admitting: Student in an Organized Health Care Education/Training Program

## 2017-08-27 DIAGNOSIS — R519 Headache, unspecified: Secondary | ICD-10-CM

## 2017-08-27 DIAGNOSIS — R51 Headache: Principal | ICD-10-CM

## 2017-08-27 NOTE — Addendum Note (Signed)
Addended by: Erlinda HongVINCENT, Katerina Zurn T on: 08/27/2017 10:01 AM   Modules accepted: Orders

## 2017-08-27 NOTE — Telephone Encounter (Signed)
I called and spoke with patient and her daughter about MRI brain and C spine results. Normal MRI brain rules out mass, CVA, or MS as cause of scalp sensation. Neck imaging shows moderate arthritis with canal stenosis, which is causing neck pain that radiates to head and shoulders. Nothing severe that I think needs surgical intervention right now. She is still experiencing the strange sensation of worms crawling in her head and headaches. I told her that I do not know what the cause is, but we have ruled out many of the dangerous conditions. These headaches are becoming disabling for her, making her stay at home more often. I will refer her to neurology, I am not sure if this is headache or neuropathy because her description of the symptoms are so unusual.

## 2017-08-27 NOTE — Progress Notes (Signed)
Internal Medicine Clinic Attending  I saw and evaluated the patient.  I personally confirmed the key portions of the history and exam documented by Dr. Nedrud and I reviewed pertinent patient test results.  The assessment, diagnosis, and plan were formulated together and I agree with the documentation in the resident's note.  

## 2017-09-01 ENCOUNTER — Encounter: Payer: Self-pay | Admitting: Neurology

## 2017-09-24 ENCOUNTER — Other Ambulatory Visit: Payer: Self-pay | Admitting: Student in an Organized Health Care Education/Training Program

## 2017-10-13 ENCOUNTER — Ambulatory Visit (INDEPENDENT_AMBULATORY_CARE_PROVIDER_SITE_OTHER): Payer: Medicare Other | Admitting: Student in an Organized Health Care Education/Training Program

## 2017-10-13 ENCOUNTER — Encounter: Payer: Self-pay | Admitting: Student in an Organized Health Care Education/Training Program

## 2017-10-13 ENCOUNTER — Other Ambulatory Visit: Payer: Self-pay

## 2017-10-13 VITALS — BP 74/38 | HR 59 | Temp 97.7°F

## 2017-10-13 DIAGNOSIS — R51 Headache: Secondary | ICD-10-CM

## 2017-10-13 DIAGNOSIS — Z79891 Long term (current) use of opiate analgesic: Secondary | ICD-10-CM | POA: Diagnosis not present

## 2017-10-13 DIAGNOSIS — I10 Essential (primary) hypertension: Secondary | ICD-10-CM

## 2017-10-13 DIAGNOSIS — Z87891 Personal history of nicotine dependence: Secondary | ICD-10-CM | POA: Diagnosis not present

## 2017-10-13 DIAGNOSIS — E876 Hypokalemia: Secondary | ICD-10-CM | POA: Diagnosis not present

## 2017-10-13 DIAGNOSIS — H547 Unspecified visual loss: Secondary | ICD-10-CM | POA: Diagnosis not present

## 2017-10-13 DIAGNOSIS — Z79899 Other long term (current) drug therapy: Secondary | ICD-10-CM

## 2017-10-13 DIAGNOSIS — R519 Headache, unspecified: Secondary | ICD-10-CM

## 2017-10-13 DIAGNOSIS — M5030 Other cervical disc degeneration, unspecified cervical region: Secondary | ICD-10-CM

## 2017-10-13 MED ORDER — CLONIDINE HCL 0.1 MG PO TABS: 0.1000 mg | ORAL_TABLET | Freq: Two times a day (BID) | ORAL | 5 refills | Status: DC

## 2017-10-13 NOTE — Progress Notes (Signed)
   Assessment and Plan:  See Encounters tab for problem-based medical decision making.   __________________________________________________________  HPI:   82 year old woman here for follow-up of hypertension.  I been seeing her almost monthly try to get her blood pressure under control.  It is been elevated persistently at many of our visits and she has had a difficult time being compliance with changes to her medication regimen.  Today her granddaughter brought in her medications in a pillbox at our clinic gave her.  They are now very well organized, they made all the changes that I recommended at last visit.  But now her blood pressure is very low at 74/38.  Patient denies any symptoms, denies dizziness, lightheadedness, recent falls.  No chest pain, chest pressure, dyspnea on exertion.  She has been eating and drinking well.  No fevers or chills or viral illness.  She feels like she is in her normal state of health.  She agrees she is probably been more compliant with her medication since there are new organization system.  Second issue discussed today was her persistent headaches.  She reports that they continue to be almost a daily issue.  Feels like a pain underneath her scalp now at the top of her head and radiating down to the side of her scalp.  She denies jaw claudication.  She continues to describe it as if there are worms in her head.  No nighttime symptoms.  Continues to have neck pain, continues to ask for an neck brace to give her some support.  She uses tramadol for that pain and reports good benefits.  __________________________________________________________  Problem List: Patient Active Problem List   Diagnosis Date Noted  . Headache 06/30/2017    Priority: High  . At high risk for falls 04/15/2016    Priority: High  . Cervical osteoarthritis 02/17/2017    Priority: Medium  . Mild cognitive impairment 08/05/2016    Priority: Medium  . Stress incontinence 06/20/2016   Priority: Medium  . Calcification of aorta (HCC) 04/15/2016    Priority: Medium  . Glaucoma 04/22/2007    Priority: Medium  . Essential hypertension 04/22/2007    Priority: Medium  . Muscle cramps 10/03/2016    Priority: Low  . Hypokalemia 03/07/2016    Priority: Low  . Health care maintenance 09/21/2014    Priority: Low  . 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

## 2017-10-13 NOTE — Assessment & Plan Note (Signed)
First reported to me in November with pain around her neck and then radiating to her head.  We did watchful waiting and then she developed some high risk associated symptoms including intermittent slurred speech, so we did an MRI brain which was reassuring with no signs of ischemic disease or mass lesion.  The MRI cervical spine confirmed multilevel degenerative disease for which we are doing supportive care.  The headache persisted and I referred her to neurology for what I thought was approaching a daily persistent headache.  Now she is describing features where the headache is mostly on the top of her head and radiates down to her temples.  No jaw claudication.  No changes in her already very poor vision.  Plan is to check ESR and CRP today to rule out giant cell arteritis, will treat with prednisone empirically if they are elevated.  She has an appointment with [facility] neurology in April.

## 2017-10-13 NOTE — Patient Instructions (Signed)
1. I think your blood pressure is low because you started taking all your medicines together more consistently.   2. Please stop amlodipine for now. And reduce Clonidine back to 0.1mg  twice a day.   3. I am going to check some more blood work today to try to figure out what is going on with your headaches. I will call you tomorrow with the results.

## 2017-10-13 NOTE — Assessment & Plan Note (Signed)
Last potassium was 3.1, she has difficulty taking oral potassium supplements because of the size of the pill.  Plan to recheck potassium level today, if still low we might have to try a potassium solution for her.

## 2017-10-13 NOTE — Assessment & Plan Note (Signed)
Historically difficult to control hypertension, so we increase clonidine from 0.1 up to 0.2 mg last visit.  Today she is hypotensive, but minimally symptomatic.  After talking with her granddaughter, I think the problem is that she started a new system with a pillbox and is now been much more compliant with her medications.  Her blood pressure remaining high at previous visits was likely due to poor compliance with her medications.  Plan is to stop amlodipine, decrease clonidine back to 0.1 mg daily, continue lisinopril 40 mg daily.  I advised her to call me if she has any worsening symptoms of hypotension, but overall she does appear well.

## 2017-10-14 ENCOUNTER — Telehealth: Payer: Self-pay | Admitting: Student in an Organized Health Care Education/Training Program

## 2017-10-14 LAB — BMP8+ANION GAP
Anion Gap: 15 mmol/L (ref 10.0–18.0)
BUN / CREAT RATIO: 12 (ref 12–28)
BUN: 13 mg/dL (ref 8–27)
CALCIUM: 8.8 mg/dL (ref 8.7–10.3)
CO2: 24 mmol/L (ref 20–29)
CREATININE: 1.09 mg/dL — AB (ref 0.57–1.00)
Chloride: 104 mmol/L (ref 96–106)
GFR calc non Af Amer: 48 mL/min/{1.73_m2} — ABNORMAL LOW (ref >59)
GFR, EST AFRICAN AMERICAN: 56 mL/min/{1.73_m2} — AB (ref >59)
Glucose: 105 mg/dL — ABNORMAL HIGH (ref 65–99)
Potassium: 3.9 mmol/L (ref 3.5–5.2)
Sodium: 143 mmol/L (ref 134–144)

## 2017-10-14 LAB — SEDIMENTATION RATE: Sed Rate: 12 mm/hr (ref 0–40)

## 2017-10-14 LAB — C-REACTIVE PROTEIN: CRP: 12.9 mg/L — ABNORMAL HIGH (ref 0.0–4.9)

## 2017-10-14 NOTE — Telephone Encounter (Signed)
I spoke with [name] about lab results. ESR is totally normal at 12, so I doubt this is GCA. Clinical features didn't fit very well either, so I would not refer for biopsy at this point. She understands, is feeling much better today after making changes to BP meds. Plan for neurology consult in April, follow up with me after or sooner if something changes. Patient and grandaughter agree.

## 2017-11-10 ENCOUNTER — Other Ambulatory Visit: Payer: Self-pay | Admitting: Student in an Organized Health Care Education/Training Program

## 2017-11-10 MED ORDER — DONEPEZIL HCL 5 MG PO TABS: 5.0000 mg | ORAL_TABLET | Freq: Every day | ORAL | 3 refills | Status: DC

## 2017-11-18 ENCOUNTER — Other Ambulatory Visit: Payer: Self-pay | Admitting: *Deleted

## 2017-11-18 MED ORDER — TRAMADOL HCL 50 MG PO TABS: 50.0000 mg | ORAL_TABLET | Freq: Every day | ORAL | 1 refills | Status: DC | PRN

## 2017-12-15 ENCOUNTER — Ambulatory Visit (INDEPENDENT_AMBULATORY_CARE_PROVIDER_SITE_OTHER): Payer: Medicare Other | Admitting: Neurology

## 2017-12-15 ENCOUNTER — Encounter: Payer: Self-pay | Admitting: Neurology

## 2017-12-15 VITALS — BP 170/82 | HR 72

## 2017-12-15 DIAGNOSIS — I10 Essential (primary) hypertension: Secondary | ICD-10-CM | POA: Diagnosis not present

## 2017-12-15 DIAGNOSIS — G4486 Cervicogenic headache: Secondary | ICD-10-CM

## 2017-12-15 DIAGNOSIS — M5030 Other cervical disc degeneration, unspecified cervical region: Secondary | ICD-10-CM

## 2017-12-15 DIAGNOSIS — R51 Headache: Secondary | ICD-10-CM

## 2017-12-15 MED ORDER — GABAPENTIN 100 MG PO CAPS: ORAL_CAPSULE | ORAL | 0 refills | Status: DC

## 2017-12-15 NOTE — Patient Instructions (Addendum)
1.  To help with neck and head pain, we will start gabapentin 100mg  capsules:    Take 1 capsule at bedtime for one week  Then 2 capsules at bedtime for one week  Then 3 capsules at bedtime 2.  If headaches are not improved after remaining on 3 capsules at bedtime for 4 weeks, then contact me and I will increase dose further. 3.  If you increase dose and you start to feel dizzy or drowsy during the day, then decrease the number of pills and remain at that dose. 4. Follow up with your PCP concerning the elevated blood pressure reading today. 5.  Follow up in 3 months.

## 2017-12-15 NOTE — Progress Notes (Signed)
NEUROLOGY CONSULTATION NOTE  [name] MRN: [id number] DOB: [birth year]  Referring provider: Tyson Alias, MD Primary care provider: Tyson Alias, MD  Reason for consult:  headache  HISTORY OF PRESENT ILLNESS: [name] is an 82 year old female with hypertension who presents for headache and neck pain.  History supplemented by PCP note.   Symptoms started several months ago. She describes an aching left sided posterior neck pain that radiates up to the occipital region, causing a moderate to severe throbbing pain with dysesthesias over the scalp.  The neck pain does not radiate down the left arm.  It is constant pain that lasts all day and for several days to weeks and will resolve for a few weeks before recurring.  Any neck movement aggravates it.  Heating pad helps relieve it.  There is no associated jaw claudication, visual disturbance, nausea, vomiting, photophobia, phonophobia, osmophobia, dizziness or unilateral numbness or weakness.  Workup includes (personally reviewed): 08/22/17 MRI BRAIN WO:  mild atrophy, otherwise unremarkable. 08/22/17 MRI CERVICAL SPINE WO:  multilevel degenerative disc disease and spondylosis with spinal and bilateral foraminal stenosis at C3-4, C4-5 and C5-6 10/13/17 LABS:  Sed Rate 12; CRP elevated at 12.9; BMP with Na 143, K 3.9, Cl 104, CO2 24, glucose 105, BUN 13 and Cr 1.09.  PAST MEDICAL HISTORY: Past Medical History:  Diagnosis Date  . 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 Left eye sees shadows and is bothered by bright lights. She wears specialty sunglasses which help her with sensitivity.   [name] CAD (coronary artery disease)    nonobstructive, myoview normal 03/2007, done by Dr. Daleen Squibb - EF = 75%  . Glaucoma   . HLD (hyperlipidemia)   . Hypertension     PAST SURGICAL HISTORY: No past surgical  history on file.  MEDICATIONS: Current Outpatient Medications on File Prior to Visit  Medication Sig Dispense Refill  . cyclobenzaprine (FLEXERIL) 5 MG tablet Take 5 mg by mouth 3 (three) times daily as needed for muscle spasms.    . nitroGLYCERIN (NITROSTAT) 0.4 MG SL tablet Place 0.4 mg under the tongue every 5 (five) minutes as needed for chest pain.    . potassium chloride (K-DUR) 10 MEQ tablet Take 10 mEq by mouth daily.    [name] atenolol (TENORMIN) 25 MG tablet Take 1 tablet (25 mg total) by mouth daily. 30 tablet 0  . bimatoprost (LUMIGAN) 0.03 % ophthalmic drops Place 1 drop into the left eye at bedtime.      . brimonidine (ALPHAGAN P) 0.1 % SOLN 1 drop in left eye twice a day    . cloNIDine (CATAPRES) 0.1 MG tablet Take 1 tablet (0.1 mg total) by mouth 2 (two) times daily. 60 tablet 5  . donepezil (ARICEPT) 5 MG tablet Take 1 tablet (5 mg total) by mouth at bedtime. 90 tablet 3  . lisinopril (PRINIVIL,ZESTRIL) 40 MG tablet Take 1 tablet (40 mg total) by mouth daily. 90 tablet 2  . traMADol (ULTRAM) 50 MG tablet Take 1 tablet (50 mg total) by mouth daily as needed for moderate pain. 30 tablet 1   No current facility-administered medications on file prior to visit.     ALLERGIES: Allergies  Allergen Reactions  . Aspirin     REACTION: gastritis with GERD  . Codeine     REACTION: nausea    FAMILY  HISTORY: Family History  Problem Relation Age of Onset  . Stroke Neg Hx   . Cancer Neg Hx     SOCIAL HISTORY: Social History   Socioeconomic History  . Marital status: Widowed    Spouse name: Not on file  . Number of children: 2  . Years of education: Not on file  . Highest education level: 12th grade  Occupational History  . Occupation: retired  Engineer, productionocial Needs  . Financial resource strain: Not on file  . Food insecurity:    Worry: Not on file    Inability: Not on file  . Transportation needs:    Medical: Not on file    Non-medical: Not on file  Tobacco Use  . Smoking  status: Former Smoker    Types: Cigarettes  . Smokeless tobacco: Never Used  Substance and Sexual Activity  . Alcohol use: No  . Drug use: No  . Sexual activity: Not on file  Lifestyle  . Physical activity:    Days per week: Not on file    Minutes per session: Not on file  . Stress: Not on file  Relationships  . Social connections:    Talks on phone: Not on file    Gets together: Not on file    Attends religious service: Not on file    Active member of club or organization: Not on file    Attends meetings of clubs or organizations: Not on file    Relationship status: Not on file  . Intimate partner violence:    Fear of current or ex partner: Not on file    Emotionally abused: Not on file    Physically abused: Not on file    Forced sexual activity: Not on file  Other Topics Concern  . Not on file  Social History Narrative   Has a son and a daughter, granddaughter spends a lot of time at her house. Sister and she live together for many years. Sister was living with her mother and Ms. [name] was married but the mother passed away and her husband passed away so they moved in together.      Pt is right handed, widowed, and  just recently moved out of her 2 story house into a 1st floor apartment.  She drinks 2 glasses of green tea daily, and is somewhat active, though is limited by her inability to see. Pt previously ran an adult day care center.     REVIEW OF SYSTEMS: Constitutional: No fevers, chills, or sweats, no generalized fatigue, change in appetite Eyes: No visual changes, double vision, eye pain Ear, nose and throat: No hearing loss, ear pain, nasal congestion, sore throat Cardiovascular: No chest pain, palpitations Respiratory:  No shortness of breath at rest or with exertion, wheezes GastrointestinaI: No nausea, vomiting, diarrhea, abdominal pain, fecal incontinence Genitourinary:  No dysuria, urinary retention or frequency Musculoskeletal:  No neck pain, back  pain Integumentary: No rash, pruritus, skin lesions Neurological: as above Psychiatric: No depression, insomnia, anxiety Endocrine: No palpitations, fatigue, diaphoresis, mood swings, change in appetite, change in weight, increased thirst Hematologic/Lymphatic:  No purpura, petechiae. Allergic/Immunologic: no itchy/runny eyes, nasal congestion, recent allergic reactions, rashes  PHYSICAL EXAM: Vitals:   12/15/17 1503  BP: (!) 170/82  Pulse: 72  SpO2: 98%   General: No acute distress.  Patient appears well-groomed.  Head:  Normocephalic/atraumatic Eyes:  fundi examined but not visualized Neck: supple, left sided paraspinal tenderness, full range of motion Back: No paraspinal tenderness Heart: regular rate  and rhythm Lungs: Clear to auscultation bilaterally. Vascular: No carotid bruits. Neurological Exam: Mental status: alert and oriented to person, place, and time, recent and remote memory intact, fund of knowledge intact, attention and concentration intact, speech fluent and not dysarthric, language intact. Cranial nerves: CN I: not tested CN II: Prosthetic right eye.  Irregular non-reactive surgical pupil in left eye.  Decreased vision in left eye. CN III, IV, VI:  full range of motion, no nystagmus, no ptosis CN V: facial sensation intact CN VII: upper and lower face symmetric CN VIII: hearing intact CN IX, X: gag intact, uvula midline CN XI: sternocleidomastoid and trapezius muscles intact CN XII: tongue midline Bulk & Tone: normal, no fasciculations. Motor:  5/5 throughout Sensation: temperature and vibration sensation intact. Deep Tendon Reflexes:  2+ throughout, toes downgoing.  Finger to nose testing:  Without dysmetria.  Gait:  Normal station and stride. Romberg negative.  IMPRESSION: Cervicogenic headache with degenerative disc disease with neural foraminal stenosis in the cervical spine. Hypertension  PLAN: 1.  We will start gabapentin 100mg  and titrate to  300mg  at bedtime. 2.  Advised to follow up with PCP regarding blood pressure. 3.  Follow up in 3 months.  Thank you for allowing me to take part in the care of this patient.  Shon Millet, DO  CC: Tyson Alias, MD

## 2017-12-22 ENCOUNTER — Encounter: Payer: Self-pay | Admitting: Neurology

## 2017-12-24 ENCOUNTER — Other Ambulatory Visit: Payer: Self-pay

## 2017-12-24 DIAGNOSIS — G4486 Cervicogenic headache: Secondary | ICD-10-CM

## 2017-12-24 DIAGNOSIS — R51 Headache: Principal | ICD-10-CM

## 2017-12-24 MED ORDER — GABAPENTIN 100 MG PO CAPS
100.0000 mg | ORAL_CAPSULE | ORAL | 1 refills | Status: DC
Start: 2017-12-24 — End: 2018-03-09

## 2017-12-24 NOTE — Telephone Encounter (Signed)
Pt called office, she has not rcvd the gabapentin from [facility]. She didn't realize it may take this long, is requesting Rx sent to [facility] on [facility]. Rx sent.

## 2018-02-02 ENCOUNTER — Telehealth: Payer: Self-pay

## 2018-02-02 ENCOUNTER — Ambulatory Visit: Payer: Medicare Other

## 2018-02-02 NOTE — Telephone Encounter (Signed)
Requesting to speak with Dr. Vincent. Please call back.  

## 2018-02-02 NOTE — Telephone Encounter (Signed)
Pt stated she had a fall 1 week ago, L hip, leg and lower back painful, not getting better[facility]

## 2018-02-03 ENCOUNTER — Other Ambulatory Visit: Payer: Self-pay

## 2018-02-03 ENCOUNTER — Ambulatory Visit (INDEPENDENT_AMBULATORY_CARE_PROVIDER_SITE_OTHER): Payer: Medicare Other | Admitting: Internal Medicine

## 2018-02-03 ENCOUNTER — Encounter: Payer: Self-pay | Admitting: Internal Medicine

## 2018-02-03 ENCOUNTER — Encounter (INDEPENDENT_AMBULATORY_CARE_PROVIDER_SITE_OTHER): Payer: Self-pay

## 2018-02-03 VITALS — BP 173/61 | HR 66 | Temp 98.3°F | Ht 65.5 in | Wt 144.9 lb

## 2018-02-03 DIAGNOSIS — R519 Headache, unspecified: Secondary | ICD-10-CM

## 2018-02-03 DIAGNOSIS — R51 Headache: Secondary | ICD-10-CM | POA: Diagnosis not present

## 2018-02-03 DIAGNOSIS — M5489 Other dorsalgia: Secondary | ICD-10-CM | POA: Diagnosis not present

## 2018-02-03 DIAGNOSIS — W19XXXA Unspecified fall, initial encounter: Secondary | ICD-10-CM | POA: Diagnosis not present

## 2018-02-03 DIAGNOSIS — Z79899 Other long term (current) drug therapy: Secondary | ICD-10-CM | POA: Diagnosis not present

## 2018-02-03 DIAGNOSIS — M25552 Pain in left hip: Secondary | ICD-10-CM

## 2018-02-03 DIAGNOSIS — M79605 Pain in left leg: Secondary | ICD-10-CM

## 2018-02-03 NOTE — Patient Instructions (Signed)
Ms. [name],   Bonita QuinYou can continue to take the Tylenol as needed for the pain. Use ice/heat as well. We will refer you to physical therapy to help you strengthen your muscles to try to keep you from having any more falls.   You can take 100 mg of the gabapentin in the morning.   Please schedule follow up with Dr. Oswaldo DoneVincent.

## 2018-02-03 NOTE — Assessment & Plan Note (Signed)
See HPI for full details.  Patient has been a high fall risk for some time.  Her fall appears to be largely related to her blindness without any concerning features for medication effect, cardiac etiology, orthostasis.  Exam is largely benign today and reassuring for no fractures.  Continue conservative therapies with Tylenol, ice/heat.  Will refer patient to physical therapy for further gait training and proximal muscle strengthening to hopefully help prevent future falls.

## 2018-02-03 NOTE — Assessment & Plan Note (Signed)
She is followed up with neurology.  They have started her on gabapentin nightly.  She reports taking gabapentin 300 mg nightly.  She does have some headaches in the morning.  Will have her take 100 mg of gabapentin in the morning.  Titrate slowly and as needed.

## 2018-02-03 NOTE — Progress Notes (Signed)
   CC: Fall  HPI:  [name] is a 82 y.o. female with a past medical history listed below here today with complaints of left-sided back/hip/leg pain after having a fall last week.   She reports that she was trying to sit in her chair and reached for the armrest.  She reports that she is not used to her degree of vision loss and missed the arm rest and lost her balance.  She denies any dizziness, lightheadedness, chest pain, palpitations, loss of consciousness, head injury.  She landed on her left side.  She reports that she was able with difficulty to get back up under her own power.  Initially she was unable to walk but has since improved.  She is using a rolling walker today and able to walk in the clinic.  She has been using Tylenol with some benefit.  Past Medical History:  Diagnosis Date  . 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 Left eye sees shadows and is bothered by bright lights. She wears specialty sunglasses which help her with sensitivity.   [name]. CAD (coronary artery disease)    nonobstructive, myoview normal 03/2007, done by Dr. Daleen Tapia - EF = 75%  . Glaucoma   . HLD (hyperlipidemia)   . Hypertension    Review of Systems:   Negative except as noted in HPI  Physical Exam:  Vitals:   02/03/18 1420  BP: (!) 173/61  Pulse: 66  Temp: 98.3 F (36.8 C)  TempSrc: Oral  SpO2: 91%  Weight: 144 lb 14.4 oz (65.7 kg)  Height: 5' 5.5" (1.664 m)   GENERAL- alert, co-operative, appears as stated age, not in any distress. CARDIAC- RRR, no murmurs, rubs or gallops. RESP- Moving equal volumes of air, and clear to auscultation bilaterally, no wheezes or crackles. MSK -no tenderness palpation over left hip or buttock.  She has full active and passive range of motion of her left hip and left knee.  Strength 5 out of 5 and sensation intact throughout.  Distal  pulses intact.  Engaging hamstring muscles does elicit some pain.  She is able to walk with aid of a walker.   Assessment & Plan:   See Encounters Tab for problem based charting.  Patient seen with Dr. Oswaldo Tapia

## 2018-02-04 NOTE — Progress Notes (Signed)
Internal Medicine Clinic Attending  I saw and evaluated the patient.  I personally confirmed the key portions of the history and exam documented by Dr. Boswell and I reviewed pertinent patient test results.  The assessment, diagnosis, and plan were formulated together and I agree with the documentation in the resident's note. 

## 2018-02-16 ENCOUNTER — Other Ambulatory Visit: Payer: Self-pay | Admitting: Student in an Organized Health Care Education/Training Program

## 2018-02-16 MED ORDER — POTASSIUM CHLORIDE ER 10 MEQ PO TBCR: 10.0000 meq | EXTENDED_RELEASE_TABLET | Freq: Every day | ORAL | 3 refills | Status: DC

## 2018-02-16 MED ORDER — DONEPEZIL HCL 5 MG PO TABS: 5.0000 mg | ORAL_TABLET | Freq: Every day | ORAL | 3 refills | Status: DC

## 2018-02-16 NOTE — Telephone Encounter (Signed)
Patient is requesting refill Potassium & donepezil 5mg , send to [facility] on [address]

## 2018-02-18 ENCOUNTER — Encounter: Payer: Self-pay | Admitting: *Deleted

## 2018-02-27 ENCOUNTER — Telehealth: Payer: Self-pay | Admitting: Student in an Organized Health Care Education/Training Program

## 2018-03-09 ENCOUNTER — Encounter: Payer: Self-pay | Admitting: Student in an Organized Health Care Education/Training Program

## 2018-03-09 ENCOUNTER — Encounter (INDEPENDENT_AMBULATORY_CARE_PROVIDER_SITE_OTHER): Payer: Self-pay

## 2018-03-09 ENCOUNTER — Ambulatory Visit (INDEPENDENT_AMBULATORY_CARE_PROVIDER_SITE_OTHER): Payer: Medicare Other | Admitting: Student in an Organized Health Care Education/Training Program

## 2018-03-09 VITALS — BP 183/76 | HR 72 | Temp 98.7°F | Wt 146.6 lb

## 2018-03-09 DIAGNOSIS — I10 Essential (primary) hypertension: Secondary | ICD-10-CM

## 2018-03-09 DIAGNOSIS — M4712 Other spondylosis with myelopathy, cervical region: Secondary | ICD-10-CM | POA: Diagnosis not present

## 2018-03-09 DIAGNOSIS — R519 Headache, unspecified: Secondary | ICD-10-CM

## 2018-03-09 DIAGNOSIS — R51 Headache: Secondary | ICD-10-CM | POA: Diagnosis not present

## 2018-03-09 DIAGNOSIS — G4486 Cervicogenic headache: Secondary | ICD-10-CM

## 2018-03-09 DIAGNOSIS — E876 Hypokalemia: Secondary | ICD-10-CM | POA: Diagnosis not present

## 2018-03-09 MED ORDER — CYCLOBENZAPRINE HCL 5 MG PO TABS: 5.0000 mg | ORAL_TABLET | Freq: Every day | ORAL | 3 refills | Status: DC

## 2018-03-09 MED ORDER — GABAPENTIN 300 MG PO CAPS: 300.0000 mg | ORAL_CAPSULE | Freq: Every day | ORAL | 3 refills | Status: DC

## 2018-03-09 MED ORDER — GABAPENTIN 100 MG PO CAPS: 100.0000 mg | ORAL_CAPSULE | Freq: Every day | ORAL | 3 refills | Status: DC

## 2018-03-09 NOTE — Patient Instructions (Addendum)
Today we talked about your high blood pressure.  We need to improve your consistency taking your medications every day.  If you miss any days of taking your medicines blood pressure is going to spike.  We are going to check your calcium levels and I will give you a call with the results in 1 to 2 days.  We talked about your neck pain and headache, which I think are related.  We decided to continue to use tramadol, ibuprofen, as needed for your pain during the day.  Gabapentin and Flexeril at night to help you sleep.  We will need to reset our expectations about this pain going forward.  Unfortunately we cannot take it away completely, but I do want to use her medicines to try to help you functionally do the things you want to do.

## 2018-03-09 NOTE — Assessment & Plan Note (Signed)
Symptomatically severe but stable, moderately function limiting. Patient not interested in surgical options.  She finds some benefit from tramadol which I think is fine to continue taking.  Patient is a fall risk.  Continue with gabapentin 100 mg in the morning and 300 mg at night.  Flexeril as needed at night for muscle spasm component.  We talked about setting reasonable expectations today as I think this pain is likely to be a chronic issue.

## 2018-03-09 NOTE — Assessment & Plan Note (Signed)
Chronic severe asymptomatic hypertension, very elevated today due to poor compliance with medications.  This includes clonidine, so likely an element of rebound hypertension.  I advised her that it is important that we are more consistent with her blood pressure medications in the future.  This regimen does work for her when she takes them, we have even seen hypotension in past visits.  Plan to restart atenolol 25 daily, clonidine 0.1 mg twice daily, lisinopril 40 mg daily.  Recheck BMP today.

## 2018-03-09 NOTE — Progress Notes (Signed)
   Assessment and Plan:  See Encounters tab for problem-based medical decision making.   __________________________________________________________  HPI:   [id number] year old woman here for follow-up of hypertension.  Patient is accompanied by her daughter today.  Daughter reports that her neck pain associated with cervical osteoarthritis has been severe lately.  Its inhibiting her functional status.  She says her mom cannot do as much as she used to be able to do.  Patient says that she just has to live with it.  She finds some benefit from tramadol, Tylenol, and gabapentin.  Sleeping okay at night with gabapentin.  Reports that the crawling sensation in her head is much improved since starting gabapentin.  No fevers or chills.  No recent falls.  Has a lot of family support at home.  Poor compliance with medications, says that she has not taken anything for about 2 days, reason is is been too busy at the house.  Has a pill organizer, daughter helps with her medications.  Patient very independent.  No chest pain or dyspnea on exertion.  __________________________________________________________  Problem List: Patient Active Problem List   Diagnosis Date Noted  . Headache 06/30/2017    Priority: High  . At high risk for falls 04/15/2016    Priority: High  . Cervical osteoarthritis 02/17/2017    Priority: Medium  . Mild cognitive impairment 08/05/2016    Priority: Medium  . Stress incontinence 06/20/2016    Priority: Medium  . Calcification of aorta (HCC) 04/15/2016    Priority: Medium  . Glaucoma 04/22/2007    Priority: Medium  . Essential hypertension 04/22/2007    Priority: Medium  . Hypokalemia 03/07/2016    Priority: Low  . Health care maintenance 09/21/2014    Priority: Low  . 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 No rashes

## 2018-03-09 NOTE — Assessment & Plan Note (Signed)
Symptomatically stable.  Recheck BMP today.  Patient really wants to come off medications

## 2018-03-10 ENCOUNTER — Encounter: Payer: Self-pay | Admitting: Student in an Organized Health Care Education/Training Program

## 2018-03-10 LAB — BMP8+ANION GAP
ANION GAP: 16 mmol/L (ref 10.0–18.0)
BUN/Creatinine Ratio: 11 — ABNORMAL LOW (ref 12–28)
BUN: 9 mg/dL (ref 8–27)
CALCIUM: 9.5 mg/dL (ref 8.7–10.3)
CO2: 24 mmol/L (ref 20–29)
CREATININE: 0.82 mg/dL (ref 0.57–1.00)
Chloride: 104 mmol/L (ref 96–106)
GFR calc Af Amer: 78 mL/min/{1.73_m2} (ref >59)
GFR, EST NON AFRICAN AMERICAN: 68 mL/min/{1.73_m2} (ref >59)
Glucose: 90 mg/dL (ref 65–99)
POTASSIUM: 3.4 mmol/L — AB (ref 3.5–5.2)
Sodium: 144 mmol/L (ref 134–144)

## 2018-03-18 ENCOUNTER — Ambulatory Visit: Payer: Medicare Other | Admitting: Neurology

## 2018-03-18 NOTE — Addendum Note (Signed)
Addended by: Neomia DearPOWERS, Andre Gallego E on: 03/18/2018 04:13 PM   Modules accepted: Orders

## 2018-03-26 ENCOUNTER — Encounter: Payer: Self-pay | Admitting: Neurology

## 2018-03-26 ENCOUNTER — Ambulatory Visit (INDEPENDENT_AMBULATORY_CARE_PROVIDER_SITE_OTHER): Payer: Medicare Other | Admitting: Neurology

## 2018-03-26 VITALS — BP 112/62 | HR 64 | Ht 65.5 in | Wt 147.0 lb

## 2018-03-26 DIAGNOSIS — G4486 Cervicogenic headache: Secondary | ICD-10-CM

## 2018-03-26 DIAGNOSIS — M5030 Other cervical disc degeneration, unspecified cervical region: Secondary | ICD-10-CM

## 2018-03-26 DIAGNOSIS — R51 Headache: Secondary | ICD-10-CM | POA: Diagnosis not present

## 2018-03-26 MED ORDER — GABAPENTIN 100 MG PO CAPS: ORAL_CAPSULE | ORAL | 0 refills | Status: DC

## 2018-03-26 NOTE — Patient Instructions (Addendum)
1.  Switch gabapentin back to 100mg  capsules:  Take 1 capsule in morning and 4 capsules at bedtime for 1 week  Then 2 capsules in morning and 4 capsules at bedtime for 1 week  Then 3 capsules in morning and 4 capsules at bedtime for 1 week  Then 4 capsules twice daily  2.  Refer to physical therapy for neck pain. You have [facility] referred [facility]. They will call you directly to schedule an appointment.  Please call [phone] if you do not hear from them.    3.  Follow up in 4 months.

## 2018-03-26 NOTE — Progress Notes (Signed)
NEUROLOGY FOLLOW UP OFFICE NOTE  [name] [id number]  HISTORY OF PRESENT ILLNESS: [name] is an 82 year old female with hypertension who follows up for headache and neck pain.  UPDATE:  In April, I started her on gabapentin titrating from 100mg  to 300mg  at bedtime.  She also takes Tylenol and tramadol but no more than 2 days a week.  She reports no improvement in headache or neck pain.   HISTORY: Symptoms started in 2018. She describes an aching left sided posterior neck pain that radiates up to the occipital region, causing a moderate to severe throbbing pain with dysesthesias over the scalp.  The neck pain does not radiate down the left arm.  It is constant pain that lasts all day and for several days to weeks and will resolve for a few weeks before recurring.  Any neck movement aggravates it.  Heating pad helps relieve it.  There is no associated jaw claudication, visual disturbance, nausea, vomiting, photophobia, phonophobia, osmophobia, dizziness or unilateral numbness or weakness.   Workup includes (personally reviewed): 08/22/17 MRI BRAIN WO:  mild atrophy, otherwise unremarkable. 08/22/17 MRI CERVICAL SPINE WO:  multilevel degenerative disc disease and spondylosis with spinal and bilateral foraminal stenosis at C3-4, C4-5 and C5-6 10/13/17 LABS:  Sed Rate 12; CRP elevated at 12.9.  PAST MEDICAL HISTORY: Past Medical History:  Diagnosis Date  . 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 Left eye sees shadows and is bothered by bright lights. She wears specialty sunglasses which help her with sensitivity.   [name] CAD (coronary artery disease)    nonobstructive, myoview normal 03/2007, done by Dr. Daleen Squibb - EF = 75%  . Glaucoma   . HLD (hyperlipidemia)   . Hypertension     MEDICATIONS: Current Outpatient Medications on File Prior to Visit  Medication  Sig Dispense Refill  . atenolol (TENORMIN) 25 MG tablet Take 1 tablet (25 mg total) by mouth daily. 30 tablet 0  . bimatoprost (LUMIGAN) 0.03 % ophthalmic drops Place 1 drop into the left eye at bedtime.      . brimonidine (ALPHAGAN P) 0.1 % SOLN 1 drop in left eye twice a day    . cloNIDine (CATAPRES) 0.1 MG tablet Take 1 tablet (0.1 mg total) by mouth 2 (two) times daily. 60 tablet 5  . cyclobenzaprine (FLEXERIL) 5 MG tablet Take 1 tablet (5 mg total) by mouth at bedtime. 30 tablet 3  . donepezil (ARICEPT) 5 MG tablet Take 1 tablet (5 mg total) by mouth at bedtime. 90 tablet 3  . lisinopril (PRINIVIL,ZESTRIL) 40 MG tablet Take 1 tablet (40 mg total) by mouth daily. 90 tablet 2  . potassium chloride (K-DUR) 10 MEQ tablet Take 1 tablet (10 mEq total) by mouth daily. 90 tablet 3  . traMADol (ULTRAM) 50 MG tablet Take 1 tablet (50 mg total) by mouth daily as needed for moderate pain. 30 tablet 1  . nitroGLYCERIN (NITROSTAT) 0.4 MG SL tablet Place 0.4 mg under the tongue every 5 (five) minutes as needed for chest pain.     No current facility-administered medications on file prior to visit.     ALLERGIES: Allergies  Allergen Reactions  . Aspirin     REACTION: gastritis with GERD  . Codeine     REACTION: nausea    FAMILY HISTORY: Family History  Problem Relation Age of Onset  .  Stroke Neg Hx   . Cancer Neg Hx     SOCIAL HISTORY: Social History   Socioeconomic History  . Marital status: Widowed    Spouse name: Not on file  . Number of children: 2  . Years of education: Not on file  . Highest education level: 12th grade  Occupational History  . Occupation: retired  Engineer, production  . Financial resource strain: Not on file  . Food insecurity:    Worry: Not on file    Inability: Not on file  . Transportation needs:    Medical: Not on file    Non-medical: Not on file  Tobacco Use  . Smoking status: Former Smoker    Types: Cigarettes  . Smokeless tobacco: Never Used    Substance and Sexual Activity  . Alcohol use: No  . Drug use: No  . Sexual activity: Not on file  Lifestyle  . Physical activity:    Days per week: Not on file    Minutes per session: Not on file  . Stress: Not on file  Relationships  . Social connections:    Talks on phone: Not on file    Gets together: Not on file    Attends religious service: Not on file    Active member of club or organization: Not on file    Attends meetings of clubs or organizations: Not on file    Relationship status: Not on file  . Intimate partner violence:    Fear of current or ex partner: Not on file    Emotionally abused: Not on file    Physically abused: Not on file    Forced sexual activity: Not on file  Other Topics Concern  . Not on file  Social History Narrative   Has a son and a daughter, granddaughter spends a lot of time at her house. Sister and she live together for many years. Sister was living with her mother and Ms. Sorrels was married but the mother passed away and her husband passed away so they moved in together.      Pt is right handed, widowed, and  just recently moved out of her 2 story house into a 1st floor apartment.  She drinks 2 glasses of green tea daily, and is somewhat active, though is limited by her inability to see. Pt previously ran an adult day care center.     REVIEW OF SYSTEMS: Constitutional: No fevers, chills, or sweats, no generalized fatigue, change in appetite Eyes: No visual changes, double vision, eye pain Ear, nose and throat: No hearing loss, ear pain, nasal congestion, sore throat Cardiovascular: No chest pain, palpitations Respiratory:  No shortness of breath at rest or with exertion, wheezes GastrointestinaI: No nausea, vomiting, diarrhea, abdominal pain, fecal incontinence Genitourinary:  No dysuria, urinary retention or frequency Musculoskeletal:  Neck pain Integumentary: No rash, pruritus, skin lesions Neurological: as above Psychiatric: No  depression, insomnia, anxiety Endocrine: No palpitations, fatigue, diaphoresis, mood swings, change in appetite, change in weight, increased thirst Hematologic/Lymphatic:  No purpura, petechiae. Allergic/Immunologic: no itchy/runny eyes, nasal congestion, recent allergic reactions, rashes  PHYSICAL EXAM: Vitals:   03/26/18 1314  BP: 112/62  Pulse: 64  SpO2: 96%   General: No acute distress.  Patient appears well-groomed.  . Head:  Normocephalic/atraumatic Eyes:  Fundi examined but not visualized Neck: supple, left paraspinal tenderness, full range of motion Heart:  Regular rate and rhythm Lungs:  Clear to auscultation bilaterally Back: No paraspinal tenderness Neurological Exam: alert and oriented  to person, place, and time. Attention span and concentration intact, recent and remote memory intact, fund of knowledge intact.  Speech fluent and not dysarthric, language intact.  Prosthetic right eye.  Irregular non-reactive surgical pupil in left eye.  Decreased vision in left eye.  Otherwise, CN II-XII intact. Bulk and tone normal, muscle strength 5/5 throughout.  Sensation to light touch  intact.  Deep tendon reflexes 2+ throughout.  Finger to nose testing intact.  Gait normal, Romberg negative.  IMPRESSION: Cervicogenic headache with degenerative disc disease with neural foraminal stenosis in the cervical spine.  PLAN: 1.  Titrate gabapentin by 100mg  per week from 300mg  at bedtime to 400mg  twice daily. 2.  Refer to physical therapy for neck pain 3.  Follow up in 4 months.  25 minutes spent face to face with patient, over 50% spent discussing management.  Shon MilletAdam Maude Gloor, DO  CC:  Dr. Oswaldo DoneVincent

## 2018-04-17 ENCOUNTER — Ambulatory Visit: Payer: Medicare Other | Attending: Neurology | Admitting: Physical Therapy

## 2018-04-17 ENCOUNTER — Encounter: Payer: Self-pay | Admitting: Physical Therapy

## 2018-04-17 ENCOUNTER — Other Ambulatory Visit: Payer: Self-pay

## 2018-04-17 DIAGNOSIS — R519 Headache, unspecified: Secondary | ICD-10-CM

## 2018-04-17 DIAGNOSIS — M542 Cervicalgia: Secondary | ICD-10-CM | POA: Insufficient documentation

## 2018-04-17 DIAGNOSIS — R293 Abnormal posture: Secondary | ICD-10-CM | POA: Diagnosis not present

## 2018-04-17 DIAGNOSIS — R51 Headache: Secondary | ICD-10-CM | POA: Insufficient documentation

## 2018-04-17 DIAGNOSIS — R2681 Unsteadiness on feet: Secondary | ICD-10-CM | POA: Diagnosis not present

## 2018-04-17 DIAGNOSIS — G8929 Other chronic pain: Secondary | ICD-10-CM

## 2018-04-17 DIAGNOSIS — R2689 Other abnormalities of gait and mobility: Secondary | ICD-10-CM | POA: Insufficient documentation

## 2018-04-17 NOTE — Therapy (Signed)
[facility] [address] [place], [place], [address] Phone: [phone]   Fax:  [phone]  Physical Therapy Evaluation  Patient Details  Name: [name] MRN: [id number] Date of Birth: [birth year] Referring Provider: 02/03/18 Valentino NoseBoswell, Nathan MD (fall); 03/26/18 Shon MilletJaffe, Adam DO (cervicogenic headache)   Encounter Date: 04/17/2018  PT End of Session - 04/17/18 2000    Visit Number  1    Number of Visits  17    Date for PT Re-Evaluation  06/16/18    Authorization Type  Medicare; [employer] (no PTAs)    Authorization Time Period  04/17/18 to 07/16/2018    PT Start Time  0817    PT Stop Time  0854    PT Time Calculation (min)  37 min    Activity Tolerance  Patient tolerated treatment well    Behavior During Therapy  [facility] for tasks assessed/performed       Past Medical History:  Diagnosis Date  . 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 Left eye sees shadows and is bothered by bright lights. She wears specialty sunglasses which help her with sensitivity.   [name]. CAD (coronary artery disease)    nonobstructive, myoview normal 03/2007, done by Dr. Daleen SquibbWall - EF = 75%  . Glaucoma   . HLD (hyperlipidemia)   . Hypertension     History reviewed. No pertinent surgical history.  There were no vitals filed for this visit.   Subjective Assessment - 04/17/18 0821    Subjective  Why am I here? When told she actually had 2 referrals (falls & cervicogenic headaches) she requested to focus on her neck and headaches. (Later stated she should also work on her balance). When younger had bad headaches and went away. Recently (about 1-1.5 months ago) started again. Not every day. I had a really bad one 4 days ago. They are on the left side of my head, from the left side of my neck and up over her ear.     Patient is  accompained by:  Family member   daughter remained in lobby; refused coming to exam room   Pertinent History  Blindness of right eye (enucleated with prosthesis) with low vision in contralateral eye; glaucoma; photosensitivity; CAD, HTN, HLD    Diagnostic tests  MRI neck 07/2017 "Multilevel disc degeneration and spondylosis with a chronic appearance. Spinal and foraminal stenosis at C3-4, C4-5, C5-6"    Patient Stated Goals  reduce her headaches; reduce her risk of falling    Currently in Pain?  Yes    Pain Score  9     Pain Location  Head    Pain Orientation  Left   top of the head   Pain Descriptors / Indicators  Aching    Pain Type  Chronic pain    Pain Onset  More than a month ago    Pain Frequency  Several days a week    Aggravating Factors   tension    Pain Relieving Factors  takes medicine         [facility] - 04/17/18 1513      Assessment   Medical Diagnosis  Fall; Cervicogenic headache    Referring Provider  02/03/18 Valentino NoseBoswell, Nathan MD (fall); 03/26/18 Shon MilletJaffe, Adam DO (cervicogenic headache)    Onset Date/Surgical Date  --   02/03/18 referral; 03/26/18 referral  Prior Therapy  none per pt      Precautions   Precautions  Fall   low vision     Restrictions   Weight Bearing Restrictions  No      Balance Screen   Has the patient fallen in the past 6 months  Yes    How many times?  1    Has the patient had a decrease in activity level because of a fear of falling?   Yes    Is the patient reluctant to leave their home because of a fear of falling?   No      Home Environment   Living Environment  Private residence    Living Arrangements  Other relatives   grandaughter   Available Help at Discharge  Family;Available PRN/intermittently   daughter also lives in town     Prior Function   Level of Independence  Independent with household mobility with device   uses blind cane     Cognition   Overall Cognitive Status  No family/caregiver present to determine  baseline cognitive functioning   repeated questions and information   Memory  Impaired    Memory Impairment  Decreased recall of new information      Observation/Other Assessments   Focus on Therapeutic Outcomes (FOTO)   NA      Sensation   Additional Comments  denies changes in left arm; otherwise NT      Posture/Postural Control   Posture/Postural Control  Postural limitations    Postural Limitations  Rounded Shoulders;Forward head;Decreased lumbar lordosis      ROM / Strength   AROM / PROM / Strength  AROM      AROM   Overall AROM   Deficits    AROM Assessment Site  Cervical   measured in sitting   Cervical Flexion  20    Cervical Extension  20    Cervical - Right Side Bend  18    Cervical - Left Side Bend  28    Cervical - Right Rotation  31    Cervical - Left Rotation  29      Flexibility   Soft Tissue Assessment /Muscle Length  --      Palpation   Palpation comment  noted tightness bil trapezius; Left more than right;       Special Tests    Special Tests  Cervical    Cervical Tests  Dictraction      Distraction Test   Findngs  Positive    Comment  relieved neck and headache pain with ~10 Lb distraction      Bed Mobility   Bed Mobility  Rolling Left;Sit to Supine;Left Sidelying to Sit    Rolling Left  Independent    Left Sidelying to Sit  Supervision/Verbal cueing   vc for technique and to NOT pull from supine to sit   Sit to Supine  Independent      Transfers   Transfers  Sit to Stand;Stand to Sit    Sit to Stand  4: Min guard;With upper extremity assist;From bed;From chair/3-in-1   minguard when not using "blind cane"     Ambulation/Gait   Ambulation/Gait  Yes    Ambulation/Gait Assistance  6: Modified independent (Device/Increase time)    Ambulation Distance (Feet)  40 Feet   x2   Assistive device  Other (Comment)   blind cane   Gait Pattern  Step-through pattern;Decreased stride length;Poor foot clearance - left;Poor foot clearance - right  Ambulation Surface  Indoor                Objective measurements completed on examination: See above findings.              PT Education - 04/17/18 1957    Education Details  due to late arrival need to focus on one area today; can further assess balance next visit; initiated HEP    Person(s) Educated  Patient    Methods  Explanation;Tactile cues;Verbal cues;Handout;Demonstration    Comprehension  Verbalized understanding;Returned demonstration;Verbal cues required;Tactile cues required;Need further instruction       PT Short Term Goals - 04/17/18 2026      PT SHORT TERM GOAL #1   Title  Patient will perform basic HEP (with family assist due to decr cognition) to address cervicogenic headaches and decreased balance (Target STGs 05/17/2018)    Time  4    Period  Weeks    Status  New      PT SHORT TERM GOAL #2   Title  Patient will have increased right cervical lateral flexion and bil cervial rotation by 10 degrees demonstrating decreased muscular tension/spasms.     Time  4    Period  Weeks    Status  New      PT SHORT TERM GOAL #3   Title  Patient will complete Berg Balance asssessment to establish baseline and assist with formulating HEP to address decreased balance.     Time  4    Period  Weeks    Status  New        PT Long Term Goals - 04/17/18 2042      PT LONG TERM GOAL #1   Title  Patient will perform updated/finalized HEP (with family assist) to address cervicogenic headaches and decreased balance (Target 06/16/2018)    Time  8    Period  Weeks    Status  New      PT LONG TERM GOAL #2   Title  Patient will report <= one headache per week with max intensity <=4/10.     Time  8    Period  Weeks    Status  New      PT LONG TERM GOAL #3   Title  Patient will improve Berg Balance score by 7 points compared to measure at 4 weeks, demonstrating reduced fall risk    Time  8    Period  Weeks    Status  New             Plan - 04/17/18  2005    Clinical Impression Statement  Patient referred to OPPT originally 02/03/18 for fall and 03/26/18 for cervicogenic headache. Due to patient's late arrival (she was next door in the MDs office by mistake), only one area could be addressed and patient requested to address her neck and headache pain (on arrival 8-9 out of 10). She does also want to address her decreased balance, which will be further assessed at next visit. Patient's pattern of headache and neck pain are consistent with tightness of the trapezius muscles with referrred pain. She responded well today to manual therapy (especially distraction) and actually reported her headache was reduced to 1/10 while supine after manual therapy. Patient can benefit from PT to reduce her neck and headache pain, requiring her to take less pain medication, which can help with decreasing her fall risk. Additional contributors to her decreased balance and increased risk of falling TBA. Patient  can benefit from the PT interventions listed below to address the deficits listed below.     History and Personal Factors relevant to plan of care:  PMH-Blindness of right eye (enucleated with prosthesis) with low vision in contralateral eye; glaucoma; photosensitivity; mild cognitive impairment, CAD, HTN, HLD;  Personal factors-low vision with difficulty seeing handouts for HEP (pt reports her grandaughter will be happy to help her with her exercises)    Clinical Presentation  Stable    Clinical Decision Making  Low    Rehab Potential  Good    PT Frequency  2x / week    PT Duration  8 weeks    PT Treatment/Interventions  ADLs/Self Care Home Management;Biofeedback;Cryotherapy;Electrical Stimulation;Gait training;DME Instruction;Ultrasound;Traction;Moist Heat;Stair training;Functional mobility training;Therapeutic activities;Therapeutic exercise;Balance training;Neuromuscular re-education;Cognitive remediation;Manual techniques;Patient/family education;Passive range of  motion;Dry needling    PT Next Visit Plan  Ask family member to come back with her (due to mild dementia). Assess fall history and balance with goals updated as appropriate. See if doing neck stretches and any ?'s; add trapezius stretch to HEP; At eval I discussed 4 week LOS, however as I wrote up eval, I did 4 and 8 week goals as I think she may need longer. (She actually has 5 weeks scheduled) Will need to add appts if appropriate.     Recommended Other Services  ?low vision eval by OT    Consulted and Agree with Plan of Care  Patient       Patient will benefit from skilled therapeutic intervention in order to improve the following deficits and impairments:  Decreased balance, Decreased cognition, Decreased mobility, Decreased knowledge of use of DME, Decreased range of motion, Decreased safety awareness, Difficulty walking, Increased fascial restricitons, Increased muscle spasms, Impaired flexibility, Postural dysfunction, Impaired vision/preception, Pain  Visit Diagnosis: Cervicalgia - Plan: PT plan of care cert/re-cert  Abnormal posture - Plan: PT plan of care cert/re-cert  Chronic nonintractable headache, unspecified headache type - Plan: PT plan of care cert/re-cert  Other abnormalities of gait and mobility - Plan: PT plan of care cert/re-cert  Unsteadiness on feet - Plan: PT plan of care cert/re-cert     Problem List Patient Active Problem List   Diagnosis Date Noted  . Headache 06/30/2017  . Cervical osteoarthritis 02/17/2017  . Mild cognitive impairment 08/05/2016  . Stress incontinence 06/20/2016  . Calcification of aorta (HCC) 04/15/2016  . At high risk for falls 04/15/2016  . Hypokalemia 03/07/2016  . Health care maintenance 09/21/2014  . 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 Glaucoma 04/22/2007  . Essential hypertension 04/22/2007    Zena Amos, PT 04/17/2018, 9:00 PM  [facility] [address] [place], [place], [address] Phone: [phone]   Fax:  [phone]  Name: Rosine Solecki MRN: [id number] Date of Birth: 11-Sep-1935

## 2018-04-17 NOTE — Patient Instructions (Signed)
**  Stretches are easier when neck in warmed up (either heating pad or after warm shower).      Lie on your back with a thin pillow or folded towel under your head only (try not to have it under your neck).  Tuck chin SLIGHTLY toward chest, as if making a double chin. You should feel the back of your neck press and get longer. Feel weight on back of head--not lifting your head. Hold _30__ seconds. Relax for 10 seconds. Repeat _3__ times. REPEAT entire set 3 times per day.   Upper Cervical Rotation    Lie down with thin pillow or folded towel under your head. Turn head slowly to one side until you feel a mild stretch. Stop and hold for 30 seconds. Then try to turn a little bit farther and hold 30 seconds. Again turn a little bit farther and hold 30 seconds. Then turn back to the middle and relax. Repeat entire sequence to the other side. Keep motions small. You should only turn until you feel a MILD stretch.    Do __3__ sessions per day.

## 2018-04-20 ENCOUNTER — Ambulatory Visit: Payer: Medicare Other | Admitting: Rehabilitative and Restorative Service Providers"

## 2018-04-29 ENCOUNTER — Ambulatory Visit: Payer: Medicare Other | Admitting: Physical Therapy

## 2018-04-30 ENCOUNTER — Ambulatory Visit: Payer: Medicare Other | Admitting: Physical Therapy

## 2018-05-05 ENCOUNTER — Ambulatory Visit: Payer: Medicare Other | Attending: Neurology | Admitting: Physical Therapy

## 2018-05-05 ENCOUNTER — Encounter: Payer: Self-pay | Admitting: Physical Therapy

## 2018-05-05 DIAGNOSIS — R2681 Unsteadiness on feet: Secondary | ICD-10-CM

## 2018-05-05 DIAGNOSIS — R293 Abnormal posture: Secondary | ICD-10-CM | POA: Diagnosis not present

## 2018-05-05 DIAGNOSIS — R51 Headache: Secondary | ICD-10-CM | POA: Diagnosis not present

## 2018-05-05 DIAGNOSIS — G8929 Other chronic pain: Secondary | ICD-10-CM

## 2018-05-05 DIAGNOSIS — R519 Headache, unspecified: Secondary | ICD-10-CM

## 2018-05-05 DIAGNOSIS — M542 Cervicalgia: Secondary | ICD-10-CM

## 2018-05-05 DIAGNOSIS — R2689 Other abnormalities of gait and mobility: Secondary | ICD-10-CM

## 2018-05-05 NOTE — Therapy (Signed)
[facility] [address] [place], [place], [address] Phone: [phone]   Fax:  [phone]  Physical Therapy Treatment  Patient Details  Name: [name] MRN: [id number] Date of Birth: [birth year] Referring Provider: 02/03/18 Valentino Nose MD (fall); 03/26/18 Shon Millet DO (cervicogenic headache)   Encounter Date: 05/05/2018  PT End of Session - 05/05/18 0934    Visit Number  2    Number of Visits  17    Date for PT Re-Evaluation  06/16/18    Authorization Type  Medicare; [employer] (no PTAs)    Authorization Time Period  04/17/18 to 07/16/2018    PT Start Time  0930    PT Stop Time  1014    PT Time Calculation (min)  44 min    Activity Tolerance  Patient tolerated treatment well    Behavior During Therapy  [facility] for tasks assessed/performed       Past Medical History:  Diagnosis Date  . 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 Left eye sees shadows and is bothered by bright lights. She wears specialty sunglasses which help her with sensitivity.   [name] CAD (coronary artery disease)    nonobstructive, myoview normal 03/2007, done by Dr. Daleen Squibb - EF = 75%  . Glaucoma   . HLD (hyperlipidemia)   . Hypertension     History reviewed. No pertinent surgical history.  There were no vitals filed for this visit.  Subjective Assessment - 05/05/18 0931    Subjective  feeling well - other than a headache. No falls recently - last fall ~6 months ago. Concerned for her balance and does not feel steady on her feet. Uses white cane all the time other than in home. Reports poor compliance with initial HEP.    Patient is accompained by:  Family member   daughter providing transportation   Pertinent History  Blindness of right eye (enucleated with prosthesis) with low vision in contralateral eye; glaucoma;  photosensitivity; CAD, HTN, HLD    Diagnostic tests  MRI neck 07/2017 "Multilevel disc degeneration and spondylosis with a chronic appearance. Spinal and foraminal stenosis at C3-4, C4-5, C5-6"    Patient Stated Goals  reduce her headaches; reduce her risk of falling    Currently in Pain?  Yes    Pain Score  3     Pain Location  Head    Pain Descriptors / Indicators  Aching;Headache    Pain Type  Chronic pain         [facility] PT Assessment - 05/05/18 0934      Assessment   Medical Diagnosis  Fall; Cervicogenic headache    Prior Therapy  none per pt      Precautions   Precautions  Fall   low vision     Restrictions   Weight Bearing Restrictions  No      Balance Screen   Has the patient fallen in the past 6 months  Yes    How many times?  1    Has the patient had a decrease in activity level because of a fear of falling?   Yes    Is the patient reluctant to leave their home because of a fear of falling?   No      Home Public house manager residence    Living Arrangements  Other  relatives   granddaughter   Available Help at Discharge  Family;Available PRN/intermittently      Prior Function   Level of Independence  Independent with household mobility with device      Cognition   Memory  Impaired   repeatedly says shes "80"   Memory Impairment  Decreased recall of new information;Decreased short term memory      Ambulation/Gait   Gait velocity  1.77 ft/sec      Balance   Balance Assessed  Yes      Standardized Balance Assessment   Standardized Balance Assessment  Berg Balance Test      Berg Balance Test   Sit to Stand  Able to stand without using hands and stabilize independently    Standing Unsupported  Able to stand safely 2 minutes    Sitting with Back Unsupported but Feet Supported on Floor or Stool  Able to sit safely and securely 2 minutes    Stand to Sit  Sits safely with minimal use of hands    Transfers  Able to transfer safely, minor  use of hands    Standing Unsupported with Eyes Closed  Able to stand 10 seconds with supervision    Standing Ubsupported with Feet Together  Able to place feet together independently and stand for 1 minute with supervision    From Standing, Reach Forward with Outstretched Arm  Can reach forward >5 cm safely (2")    From Standing Position, Pick up Object from Floor  Able to pick up shoe, needs supervision    From Standing Position, Turn to Look Behind Over each Shoulder  Looks behind one side only/other side shows less weight shift    Turn 360 Degrees  Needs close supervision or verbal cueing    Standing Unsupported, Alternately Place Feet on Step/Stool  Able to complete 4 steps without aid or supervision    Standing Unsupported, One Foot in [employer] balance while stepping or standing    Standing on One Leg  Tries to lift leg/unable to hold 3 seconds but remains standing independently    Total Score  38                   [facility] Adult PT Treatment/Exercise - 05/05/18 0950      Ambulation/Gait   Gait velocity  1.77 ft/sec      Exercises   Exercises  Neck      Neck Exercises: Supine   Neck Retraction  10 reps    Cervical Rotation  Both;10 reps    Cervical Rotation Limitations  verbal cueing to complete ROM    Lateral Flexion  Both;5 reps    Lateral Flexion Limitations  AA - to create correct motion      Manual Therapy   Manual Therapy  Soft tissue mobilization;Manual Traction    Manual therapy comments  patient hooklying    Soft tissue mobilization  STM to B UT, B infra, B cervical paraspinals, B suboccipital mm.    Manual Traction  manual traction 2 x 15 seconds             PT Education - 05/05/18 1128    Education Details  discussion on balance assessment and results today - high fall risk according to Sharlene Motts, reduced gait speed    Person(s) Educated  Patient    Methods  Explanation    Comprehension  Verbalized understanding       PT Short Term Goals -  05/05/18 1129  PT SHORT TERM GOAL #1   Title  Patient will perform basic HEP (with family assist due to decr cognition) to address cervicogenic headaches and decreased balance (Target STGs 05/17/2018)    Time  4    Period  Weeks    Status  On-going      PT SHORT TERM GOAL #2   Title  Patient will have increased right cervical lateral flexion and bil cervial rotation by 10 degrees demonstrating decreased muscular tension/spasms.     Time  4    Period  Weeks    Status  On-going      PT SHORT TERM GOAL #3   Title  Patient will complete Berg Balance asssessment to establish baseline and assist with formulating HEP to address decreased balance.     Baseline  05/05/18 Sharlene Motts 38/56; gait speed 1.77 ft/sec; SLS B ~1-2 seconds; tandem stance B ~2-3 sec    Time  4    Period  Weeks    Status  Achieved        PT Long Term Goals - 05/05/18 1130      PT LONG TERM GOAL #1   Title  Patient will perform updated/finalized HEP (with family assist) to address cervicogenic headaches and decreased balance (Target 06/16/2018)    Time  8    Period  Weeks    Status  On-going      PT LONG TERM GOAL #2   Title  Patient will report <= one headache per week with max intensity <=4/10.     Time  8    Period  Weeks    Status  On-going      PT LONG TERM GOAL #3   Title  Patient will improve Berg Balance score by 7 points compared to measure at 4 weeks, demonstrating reduced fall risk    Time  8    Period  Weeks    Status  On-going      PT LONG TERM GOAL #4   Title  Patient to improve gait speed to >/= 2.62 ft/sec demonstrating improved functional mobility    Baseline  1.77 ft/sec    Time  8    Period  Weeks    Status  New            Plan - 05/05/18 0934    Clinical Impression Statement  Patient presenting to PT today with use of white cane and darkened glasses, of which patient preferring to not remove during entriety of session. PT today assessing balance and fall risk with patient  demonstrating high fall risk on Berg Balance with score of 38/56 as well as reduced gait speed of 1.77 ft/sec. Patient reporting limited compliance with HEP this far, therefore re-printed and educated on need to comply for full benefit and carryover. Patient with conflicting subjective reports of balance and confidence with balance, however will benefit from continued skilled PT intervention to address not only neck pain/headaches but as well as balance and fall risk. WIll continue to progress towards goals.     Rehab Potential  Good    PT Frequency  2x / week    PT Duration  8 weeks    PT Treatment/Interventions  ADLs/Self Care Home Management;Biofeedback;Cryotherapy;Electrical Stimulation;Gait training;DME Instruction;Ultrasound;Traction;Moist Heat;Stair training;Functional mobility training;Therapeutic activities;Therapeutic exercise;Balance training;Neuromuscular re-education;Cognitive remediation;Manual techniques;Patient/family education;Passive range of motion;Dry needling    PT Next Visit Plan  ask family member to be present for session - engage family in HEP; update HEP to include stretches  and balance activities     Consulted and Agree with Plan of Care  Patient       Patient will benefit from skilled therapeutic intervention in order to improve the following deficits and impairments:  Decreased balance, Decreased cognition, Decreased mobility, Decreased knowledge of use of DME, Decreased range of motion, Decreased safety awareness, Difficulty walking, Increased fascial restricitons, Increased muscle spasms, Impaired flexibility, Postural dysfunction, Impaired vision/preception, Pain  Visit Diagnosis: Cervicalgia  Abnormal posture  Chronic nonintractable headache, unspecified headache type  Other abnormalities of gait and mobility  Unsteadiness on feet     Problem List Patient Active Problem List   Diagnosis Date Noted  . Headache 06/30/2017  . Cervical osteoarthritis  02/17/2017  . Mild cognitive impairment 08/05/2016  . Stress incontinence 06/20/2016  . Calcification of aorta (HCC) 04/15/2016  . At high risk for falls 04/15/2016  . Hypokalemia 03/07/2016  . Health care maintenance 09/21/2014  . 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 Glaucoma 04/22/2007  . Essential hypertension 04/22/2007    Kipp Laurence, PT, DPT 05/05/18 11:42 AM Pager: [phone]   [facility] [address] [place], [place], [address] Phone: [phone]   Fax:  [phone]  Name: [name] MRN: [id number] Date of Birth: 12-29-1935

## 2018-05-08 ENCOUNTER — Encounter: Payer: Self-pay | Admitting: Rehabilitation

## 2018-05-08 ENCOUNTER — Ambulatory Visit: Payer: Medicare Other | Admitting: Rehabilitation

## 2018-05-08 DIAGNOSIS — R2689 Other abnormalities of gait and mobility: Secondary | ICD-10-CM | POA: Diagnosis not present

## 2018-05-08 DIAGNOSIS — R2681 Unsteadiness on feet: Secondary | ICD-10-CM | POA: Diagnosis not present

## 2018-05-08 DIAGNOSIS — M542 Cervicalgia: Secondary | ICD-10-CM | POA: Diagnosis not present

## 2018-05-08 DIAGNOSIS — R293 Abnormal posture: Secondary | ICD-10-CM

## 2018-05-08 DIAGNOSIS — R51 Headache: Secondary | ICD-10-CM | POA: Diagnosis not present

## 2018-05-08 NOTE — Therapy (Signed)
[facility] [address] [place], [place], [address] Phone: [phone]   Fax:  [phone]  Physical Therapy Treatment  Patient Details  Name: [name] MRN: [id number] Date of Birth: [birth year] Referring Provider: 02/03/18 Valentino Nose MD (fall); 03/26/18 Shon Millet DO (cervicogenic headache)   Encounter Date: 05/08/2018  PT End of Session - 05/08/18 1330    Visit Number  3    Number of Visits  17    Date for PT Re-Evaluation  06/16/18    Authorization Type  Medicare; [employer] (no PTAs)    Authorization Time Period  04/17/18 to 07/16/2018    PT Start Time  1102    PT Stop Time  1145    PT Time Calculation (min)  43 min    Activity Tolerance  Patient tolerated treatment well    Behavior During Therapy  [facility] for tasks assessed/performed       Past Medical History:  Diagnosis Date  . 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 Left eye sees shadows and is bothered by bright lights. She wears specialty sunglasses which help her with sensitivity.   [name] CAD (coronary artery disease)    nonobstructive, myoview normal 03/2007, done by Dr. Daleen Squibb - EF = 75%  . Glaucoma   . HLD (hyperlipidemia)   . Hypertension     History reviewed. No pertinent surgical history.  There were no vitals filed for this visit.  Subjective Assessment - 05/08/18 1104    Subjective  Doing well today, no headache today.  Neck feels good.      Patient is accompained by:  Family member   in waiting room   Pertinent History  Blindness of right eye (enucleated with prosthesis) with low vision in contralateral eye; glaucoma; photosensitivity; CAD, HTN, HLD    Diagnostic tests  MRI neck 07/2017 "Multilevel disc degeneration and spondylosis with a chronic appearance. Spinal and foraminal stenosis at C3-4, C4-5, C5-6"    Patient  Stated Goals  reduce her headaches; reduce her risk of falling    Currently in Pain?  No/denies                       [facility] Adult PT Treatment/Exercise - 05/08/18 1118      Ambulation/Gait   Ambulation/Gait  Yes    Ambulation/Gait Assistance  6: Modified independent (Device/Increase time);5: Supervision    Ambulation/Gait Assistance Details  Pt requires S for min guiding assist and also for correct use and positioning of RW.  Also needs cues to keep RW on ground when turning, but she did return demo during session.  Pt reports that she feels much steadier with use of RW and would like to pursue.  PT to request order from MD regarding RW.  Also provided this information to daughter at end of session.      Ambulation Distance (Feet)  300 Feet    Assistive device  Rolling walker    Gait Pattern  Step-through pattern;Decreased stride length;Poor foot clearance - left;Poor foot clearance - right    Ambulation Surface  Level;Indoor    Stairs  --    Ramp  5: Supervision    Ramp Details (indicate cue type and reason)  min cues for sequencing with RW, guiding cues for vision deficits    Curb  5: Supervision  Curb Details (indicate cue type and reason)  Cues for sequencing and technique with RW.  Performed x 2 reps      Exercises   Exercises  Neck      Neck Exercises: Supine   Neck Retraction  10 reps    Neck Retraction Limitations  tactile cues for technique    Cervical Rotation  Both;10 reps    Cervical Rotation Limitations  verbal cueing to complete ROM    Other Supine Exercise  isometric scapular retraction x 10 reps with cues for shoulder depression.       Manual Therapy   Manual Therapy  Soft tissue mobilization;Myofascial release;Manual Traction    Manual therapy comments  Hooklying manual therapy to cervical and paracervical region to reduce pain and improve flexibility     Soft tissue mobilization  STM to B UT, B infra, B cervical paraspinals, B suboccipital mm.     Myofascial Release  trigger point release to L and R levator    Manual Traction  manual traction 2 x 30 seconds      Neck Exercises: Stretches   Upper Trapezius Stretch  Right;1 rep;30 seconds   with light overpressure at head and R shoulder            PT Education - 05/08/18 1329    Education Details  discussion on getting RW due to high  fall risk, posting exercises on head board to increase compliance. Also educated pts daughter to NOT purchase and use neck collar at this time due to implications it would have on neck musculature along with balance.     Person(s) Educated  Patient;Child(ren)    Methods  Explanation;Demonstration    Comprehension  Verbalized understanding;Returned demonstration       PT Short Term Goals - 05/05/18 1129      PT SHORT TERM GOAL #1   Title  Patient will perform basic HEP (with family assist due to decr cognition) to address cervicogenic headaches and decreased balance (Target STGs 05/17/2018)    Time  4    Period  Weeks    Status  On-going      PT SHORT TERM GOAL #2   Title  Patient will have increased right cervical lateral flexion and bil cervial rotation by 10 degrees demonstrating decreased muscular tension/spasms.     Time  4    Period  Weeks    Status  On-going      PT SHORT TERM GOAL #3   Title  Patient will complete Berg Balance asssessment to establish baseline and assist with formulating HEP to address decreased balance.     Baseline  05/05/18 Sharlene Motts 38/56; gait speed 1.77 ft/sec; SLS B ~1-2 seconds; tandem stance B ~2-3 sec    Time  4    Period  Weeks    Status  Achieved        PT Long Term Goals - 05/05/18 1130      PT LONG TERM GOAL #1   Title  Patient will perform updated/finalized HEP (with family assist) to address cervicogenic headaches and decreased balance (Target 06/16/2018)    Time  8    Period  Weeks    Status  On-going      PT LONG TERM GOAL #2   Title  Patient will report <= one headache per week with max  intensity <=4/10.     Time  8    Period  Weeks    Status  On-going  PT LONG TERM GOAL #3   Title  Patient will improve Berg Balance score by 7 points compared to measure at 4 weeks, demonstrating reduced fall risk    Time  8    Period  Weeks    Status  On-going      PT LONG TERM GOAL #4   Title  Patient to improve gait speed to >/= 2.62 ft/sec demonstrating improved functional mobility    Baseline  1.77 ft/sec    Time  8    Period  Weeks    Status  New            Plan - 05/08/18 1331    Clinical Impression Statement  Skilled session focused on review of HEP for improved compliance, manual therapy to cervical musculature to improve ROM, decrease pain and improved flexibility.  Also initiated use of RW for improved safety.  Pt will be safe to use RW at mod I level in home, however would recommend S for community use due to visual deficits.      Rehab Potential  Good    PT Frequency  2x / week    PT Duration  8 weeks    PT Treatment/Interventions  ADLs/Self Care Home Management;Biofeedback;Cryotherapy;Electrical Stimulation;Gait training;DME Instruction;Ultrasound;Traction;Moist Heat;Stair training;Functional mobility training;Therapeutic activities;Therapeutic exercise;Balance training;Neuromuscular re-education;Cognitive remediation;Manual techniques;Patient/family education;Passive range of motion;Dry needling    PT Next Visit Plan  ask family member to be present for session - engage family in HEP, give order for RW if able; update HEP to include stretches and balance activities     Consulted and Agree with Plan of Care  Patient       Patient will benefit from skilled therapeutic intervention in order to improve the following deficits and impairments:  Decreased balance, Decreased cognition, Decreased mobility, Decreased knowledge of use of DME, Decreased range of motion, Decreased safety awareness, Difficulty walking, Increased fascial restricitons, Increased muscle spasms,  Impaired flexibility, Postural dysfunction, Impaired vision/preception, Pain  Visit Diagnosis: Cervicalgia  Abnormal posture  Other abnormalities of gait and mobility  Unsteadiness on feet     Problem List Patient Active Problem List   Diagnosis Date Noted  . Headache 06/30/2017  . Cervical osteoarthritis 02/17/2017  . Mild cognitive impairment 08/05/2016  . Stress incontinence 06/20/2016  . Calcification of aorta (HCC) 04/15/2016  . At high risk for falls 04/15/2016  . Hypokalemia 03/07/2016  . Health care maintenance 09/21/2014  . 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 Glaucoma 04/22/2007  . Essential hypertension 04/22/2007    [name], PT, MPT [facility] [address] [place], [place], [address] Phone: [phone]   Fax:  [phone] 05/08/18, 1:34 PM  Name: [name] MRN: [id number] Date of Birth: 1936/02/26

## 2018-05-11 ENCOUNTER — Telehealth: Payer: Self-pay | Admitting: Rehabilitation

## 2018-05-11 DIAGNOSIS — R2681 Unsteadiness on feet: Secondary | ICD-10-CM

## 2018-05-11 NOTE — Telephone Encounter (Signed)
Sandi, could we please place a referral for a rolling walker for [name]?  Thank you.

## 2018-05-11 NOTE — Telephone Encounter (Signed)
Dr. Everlena CooperJaffe,   I am seeing [facility].  She is a very high fall risk from her score on the BERG balance test and we have trialed using RW during our last session.  Note that she demonstrates improved balance and gait speed with RW and she verbalized that she felt more secure with RW.  Please write referral for rolling walker in Epic work que and I can provide to them at next visit.    Thanks,  Harriet ButteEmily Kouper Spinella, PT, MPT [facility] [address] [place], [place], [address] Phone: [phone]   Fax:  [phone] 05/11/18, 1:06 PM

## 2018-05-12 ENCOUNTER — Ambulatory Visit: Payer: Medicare Other | Admitting: Rehabilitation

## 2018-05-15 ENCOUNTER — Ambulatory Visit: Payer: Medicare Other | Admitting: Rehabilitation

## 2018-05-19 ENCOUNTER — Ambulatory Visit: Payer: Medicare Other | Admitting: Rehabilitation

## 2018-05-19 ENCOUNTER — Encounter: Payer: Self-pay | Admitting: Rehabilitation

## 2018-05-19 DIAGNOSIS — M542 Cervicalgia: Secondary | ICD-10-CM | POA: Diagnosis not present

## 2018-05-19 DIAGNOSIS — R293 Abnormal posture: Secondary | ICD-10-CM | POA: Diagnosis not present

## 2018-05-19 DIAGNOSIS — R51 Headache: Secondary | ICD-10-CM | POA: Diagnosis not present

## 2018-05-19 DIAGNOSIS — R2689 Other abnormalities of gait and mobility: Secondary | ICD-10-CM | POA: Diagnosis not present

## 2018-05-19 DIAGNOSIS — R2681 Unsteadiness on feet: Secondary | ICD-10-CM

## 2018-05-19 NOTE — Therapy (Signed)
[facility] [address] [place], [place], [address] Phone: [phone]   Fax:  [phone]  Physical Therapy Treatment  Patient Details  Name: [name] MRN: [id number] Date of Birth: 14-Sep-1935 Referring Provider: 02/03/18 Valentino Nose MD (fall); 03/26/18 Shon Millet DO (cervicogenic headache)   Encounter Date: 05/19/2018  PT End of Session - 05/19/18 0855    Visit Number  4    Number of Visits  17    Date for PT Re-Evaluation  06/16/18    Authorization Type  Medicare; [employer] (no PTAs)    Authorization Time Period  04/17/18 to 07/16/2018    PT Start Time  0847    PT Stop Time  0930    PT Time Calculation (min)  43 min    Activity Tolerance  Patient tolerated treatment well    Behavior During Therapy  [facility] for tasks assessed/performed       Past Medical History:  Diagnosis Date  . 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 Left eye sees shadows and is bothered by bright lights. She wears specialty sunglasses which help her with sensitivity.   [name] CAD (coronary artery disease)    nonobstructive, myoview normal 03/2007, done by Dr. Daleen Squibb - EF = 75%  . Glaucoma   . HLD (hyperlipidemia)   . Hypertension     History reviewed. No pertinent surgical history.  There were no vitals filed for this visit.  Subjective Assessment - 05/19/18 0853    Subjective  Pt reports increased neck pain today, 6/10    Patient is accompained by:  Family member    Pertinent History  Blindness of right eye (enucleated with prosthesis) with low vision in contralateral eye; glaucoma; photosensitivity; CAD, HTN, HLD    Diagnostic tests  MRI neck 07/2017 "Multilevel disc degeneration and spondylosis with a chronic appearance. Spinal and foraminal stenosis at C3-4, C4-5, C5-6"    Patient Stated Goals  reduce her  headaches; reduce her risk of falling    Currently in Pain?  Yes    Pain Score  6     Pain Location  Neck    Pain Orientation  Right    Pain Descriptors / Indicators  Discomfort    Pain Type  Chronic pain    Pain Onset  More than a month ago    Pain Frequency  Intermittent    Aggravating Factors   tension    Pain Relieving Factors  takes medication                        [facility] Adult PT Treatment/Exercise - 05/19/18 6962      Ambulation/Gait   Ambulation/Gait  Yes    Ambulation/Gait Assistance  6: Modified independent (Device/Increase time);5: Supervision   S for minor guiding due to vision deficits   Ambulation/Gait Assistance Details  Continue to work on gait with use of RW as PT did get order for RW.  Pt doing much better today with improved gait speed, however does require intermittent S for guiding assist.      Ambulation Distance (Feet)  300 Feet    Assistive device  Rolling walker    Gait Pattern  Step-through pattern;Decreased stride length;Poor foot clearance - left;Poor foot clearance - right    Ambulation Surface  Level;Indoor    Ramp  5:  Supervision    Ramp Details (indicate cue type and reason)  S for safety    Curb  5: Supervision    Curb Details (indicate cue type and reason)  S for safety and min cues for technique with RW      Self-Care   Self-Care  Other Self-Care Comments    Other Self-Care Comments   Provided new handout for HEP with additional exercises along with chart with exercises listed so that she may check off exercises.  Provided handout to daughter with education to come up with time of day that they can go through them together and check off list.  Also provided pt with RW order and directions to [facility] so that they may go today if able to get RW to improve pts safety and ability to ambulate more independently.  Pt and daughter verbalized understanding.       Neuro Re-ed    Neuro Re-ed Details   Performed marching along  counter top x 3 reps with light UE support-added to HEP, tandem stance x 2 reps on each side for 15 secs with light UE support-added to HEP      Exercises   Exercises  Neck;Other Exercises    Other Exercises   standing hip abd x 10 reps with light UE support      Neck Exercises: Theraband   Scapula Retraction  10 reps   yello   Scapula Retraction Limitations  Standing on blue airex for balance challenge    Shoulder Extension  10 reps   yellow   Shoulder Extension Limitations  on solid ground with feet apart       TE:  While standing against wall, had pt "push" into wall to improve trunk extension and neck retraction x 3 reps of 10 sec holds.     **Stretches are easier when neck in warmed up (either heating pad or after warm shower).      Lie on your back with a thin pillow or folded towel under your head only (try not to have it under your neck).  Tuck chin SLIGHTLY toward chest, as if making a double chin. You should feel the back of your neck press and get longer. Feel weight on back of head--not lifting your head. Hold _30__ seconds. Relax for 10 seconds. Repeat _3__ times. REPEAT entire set 3 times per day.   Upper Cervical Rotation    Lie down with thin pillow or folded towel under your head. Turn head slowly to one side until you feel a mild stretch. Stop and hold for 30 seconds. Then try to turn a little bit farther and hold 30 seconds. Again turn a little bit farther and hold 30 seconds. Then turn back to the middle and relax. Repeat entire sequence to the other side. Keep motions small. You should only turn until you feel a MILD stretch.    Do __3__ sessions per day.  HIP: Abduction - Standing    Stand at counter top or behind chair.  Try to use only one hand for support.  Squeeze glutes. Raise leg out and slightly back. __10_ reps per set, _1__ sets per day, _5-7_ days per week Hold onto a support.  Copyright  VHI. All rights reserved.   Marching  In-Place    Stand beside counter top with support from hand (be light).  March forwards to end of counter top then backwards.  Stay tall!! March slowly and bring knees as high as you  can.  Do 3 laps in the kitchen.  1 time per day.   Copyright  VHI. All rights reserved.   Tandem Stance    Stand beside counter top for support as needed. Right foot in front of left, heel touching toe both feet "straight ahead". Stand on Foot Triangle of Support with both feet. Balance in this position __15_ seconds. Do with left foot in front of right. Do 3 times on each side. 1 time per day.   Copyright  VHI. All rights reserved.                               (Exercise) Monday Tuesday Wednesday Thursday Friday Saturday Sunday   Cervical rotation           Cervical retraction           Standing hip abduction           Marching           Tandem Stance                                                          PT Education - 05/19/18 0855    Education Details  see self care    Person(s) Educated  Patient;Child(ren)    Methods  Explanation;Handout;Demonstration    Comprehension  Verbalized understanding;Returned demonstration       PT Short Term Goals - 05/05/18 1129      PT SHORT TERM GOAL #1   Title  Patient will perform basic HEP (with family assist due to decr cognition) to address cervicogenic headaches and decreased balance (Target STGs 05/17/2018)    Time  4    Period  Weeks    Status  On-going      PT SHORT TERM GOAL #2   Title  Patient will have increased right cervical lateral flexion and bil cervial rotation by 10 degrees demonstrating decreased muscular tension/spasms.     Time  4    Period  Weeks    Status  On-going      PT SHORT TERM GOAL #3   Title  Patient will complete Berg Balance asssessment to establish baseline and assist with formulating HEP to address decreased balance.     Baseline  05/05/18 Sharlene Motts 38/56; gait speed  1.77 ft/sec; SLS B ~1-2 seconds; tandem stance B ~2-3 sec    Time  4    Period  Weeks    Status  Achieved        PT Long Term Goals - 05/05/18 1130      PT LONG TERM GOAL #1   Title  Patient will perform updated/finalized HEP (with family assist) to address cervicogenic headaches and decreased balance (Target 06/16/2018)    Time  8    Period  Weeks    Status  On-going      PT LONG TERM GOAL #2   Title  Patient will report <= one headache per week with max intensity <=4/10.     Time  8    Period  Weeks    Status  On-going      PT LONG TERM GOAL #3   Title  Patient will improve Berg Balance score by 7 points compared to measure at  4 weeks, demonstrating reduced fall risk    Time  8    Period  Weeks    Status  On-going      PT LONG TERM GOAL #4   Title  Patient to improve gait speed to >/= 2.62 ft/sec demonstrating improved functional mobility    Baseline  1.77 ft/sec    Time  8    Period  Weeks    Status  New            Plan - 05/19/18 0946    Clinical Impression Statement  Skilled session focused on continued safety with use of RW, additional HEP exericses for balance along with chart to improve compliance.  Also addressed neck pain with improved activation of larger thoracic muscles (lats and scap retraction).  Pt tolerated very well.      Rehab Potential  Good    PT Frequency  2x / week    PT Duration  8 weeks    PT Treatment/Interventions  ADLs/Self Care Home Management;Biofeedback;Cryotherapy;Electrical Stimulation;Gait training;DME Instruction;Ultrasound;Traction;Moist Heat;Stair training;Functional mobility training;Therapeutic activities;Therapeutic exercise;Balance training;Neuromuscular re-education;Cognitive remediation;Manual techniques;Patient/family education;Passive range of motion;Dry needling    PT Next Visit Plan  Was family able to assist with HEP,  hesitant to add to HEP due to compliance issues/memory issues.  Continue balance, strength, postural  exericses, cervical manual therapy/exercises as able.     Consulted and Agree with Plan of Care  Patient       Patient will benefit from skilled therapeutic intervention in order to improve the following deficits and impairments:  Decreased balance, Decreased cognition, Decreased mobility, Decreased knowledge of use of DME, Decreased range of motion, Decreased safety awareness, Difficulty walking, Increased fascial restricitons, Increased muscle spasms, Impaired flexibility, Postural dysfunction, Impaired vision/preception, Pain  Visit Diagnosis: Unsteadiness on feet  Cervicalgia  Abnormal posture  Other abnormalities of gait and mobility     Problem List Patient Active Problem List   Diagnosis Date Noted  . Headache 06/30/2017  . Cervical osteoarthritis 02/17/2017  . Mild cognitive impairment 08/05/2016  . Stress incontinence 06/20/2016  . Calcification of aorta (HCC) 04/15/2016  . At high risk for falls 04/15/2016  . Hypokalemia 03/07/2016  . Health care maintenance 09/21/2014  . 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 Glaucoma 04/22/2007  . Essential hypertension 04/22/2007    Harriet ButteEmily Marvelyn Bouchillon, PT, MPT [facility] [address] [place], [place], [address] Phone: [phone]   Fax:  [phone] 05/19/18, 9:51 AM  Name: Diamond Dolinmelia Widen MRN: [id number] Date of Birth: 1936/05/05

## 2018-05-19 NOTE — Patient Instructions (Addendum)
**  Stretches are easier when neck in warmed up (either heating pad or after warm shower).      Lie on your back with a thin pillow or folded towel under your head only (try not to have it under your neck).  Tuck chin SLIGHTLY toward chest, as if making a double chin. You should feel the back of your neck press and get longer. Feel weight on back of head--not lifting your head. Hold _30__ seconds. Relax for 10 seconds. Repeat _3__ times. REPEAT entire set 3 times per day.   Upper Cervical Rotation    Lie down with thin pillow or folded towel under your head. Turn head slowly to one side until you feel a mild stretch. Stop and hold for 30 seconds. Then try to turn a little bit farther and hold 30 seconds. Again turn a little bit farther and hold 30 seconds. Then turn back to the middle and relax. Repeat entire sequence to the other side. Keep motions small. You should only turn until you feel a MILD stretch.    Do __3__ sessions per day.  HIP: Abduction - Standing    Stand at counter top or behind chair.  Try to use only one hand for support.  Squeeze glutes. Raise leg out and slightly back. __10_ reps per set, _1__ sets per day, _5-7_ days per week Hold onto a support.  Copyright  VHI. All rights reserved.   Marching In-Place    Stand beside counter top with support from hand (be light).  March forwards to end of counter top then backwards.  Stay tall!! March slowly and bring knees as high as you can.  Do 3 laps in the kitchen.  1 time per day.   Copyright  VHI. All rights reserved.   Tandem Stance    Stand beside counter top for support as needed. Right foot in front of left, heel touching toe both feet "straight ahead". Stand on Foot Triangle of Support with both feet. Balance in this position __15_ seconds. Do with left foot in front of right. Do 3 times on each side. 1 time per day.   Copyright  VHI. All rights reserved.                                (Exercise) Monday Tuesday Wednesday Thursday Friday Saturday Sunday   Cervical rotation           Cervical retraction           Standing hip abduction           Marching           Tandem Stance

## 2018-05-22 ENCOUNTER — Ambulatory Visit: Payer: Medicare Other | Admitting: Rehabilitation

## 2018-05-26 ENCOUNTER — Ambulatory Visit: Payer: Medicare Other | Admitting: Rehabilitation

## 2018-05-29 ENCOUNTER — Encounter: Payer: Self-pay | Admitting: Rehabilitation

## 2018-05-29 ENCOUNTER — Ambulatory Visit: Payer: Medicare Other | Attending: Neurology | Admitting: Rehabilitation

## 2018-05-29 ENCOUNTER — Ambulatory Visit: Payer: Medicare Other | Admitting: Rehabilitation

## 2018-05-29 DIAGNOSIS — R2681 Unsteadiness on feet: Secondary | ICD-10-CM

## 2018-05-29 DIAGNOSIS — R2689 Other abnormalities of gait and mobility: Secondary | ICD-10-CM | POA: Diagnosis not present

## 2018-05-29 DIAGNOSIS — R293 Abnormal posture: Secondary | ICD-10-CM | POA: Diagnosis not present

## 2018-05-29 DIAGNOSIS — M542 Cervicalgia: Secondary | ICD-10-CM | POA: Diagnosis not present

## 2018-05-29 NOTE — Therapy (Signed)
[facility] [address] [place], [place], [address] Phone: [phone]   Fax:  [phone]  Physical Therapy Treatment  Patient Details  Name: [name] MRN: [id number] Date of Birth: 06-21-1936 Referring Provider (PT): 02/03/18 Valentino Nose MD (fall); 03/26/18 Shon Millet DO (cervicogenic headache)   Encounter Date: 05/29/2018  PT End of Session - 05/29/18 0939    Visit Number  5    Number of Visits  17    Date for PT Re-Evaluation  06/16/18    Authorization Type  Medicare; [employer]are (no PTAs)    Authorization Time Period  04/17/18 to 07/16/2018    PT Start Time  0845    PT Stop Time  0930    PT Time Calculation (min)  45 min    Activity Tolerance  Patient tolerated treatment well    Behavior During Therapy  [facility] for tasks assessed/performed       Past Medical History:  Diagnosis Date  . 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 Left eye sees shadows and is bothered by bright lights. She wears specialty sunglasses which help her with sensitivity.   [name] CAD (coronary artery disease)    nonobstructive, myoview normal 03/2007, done by Dr. Daleen Squibb - EF = 75%  . Glaucoma   . HLD (hyperlipidemia)   . Hypertension     History reviewed. No pertinent surgical history.  There were no vitals filed for this visit.  Subjective Assessment - 05/29/18 0901    Subjective  Reports some pain in neck today.  Daughter asked to come back with pt.  Reports she doesn't want to be here.      Patient is accompained by:  Family member    Pertinent History  Blindness of right eye (enucleated with prosthesis) with low vision in contralateral eye; glaucoma; photosensitivity; CAD, HTN, HLD    Diagnostic tests  MRI neck 07/2017 "Multilevel disc degeneration and spondylosis with a chronic appearance. Spinal and foraminal  stenosis at C3-4, C4-5, C5-6"    Patient Stated Goals  reduce her headaches; reduce her risk of falling    Currently in Pain?  Yes    Pain Score  5     Pain Location  Neck    Pain Orientation  Right;Left    Pain Descriptors / Indicators  Aching;Sharp    Pain Type  Chronic pain    Pain Onset  More than a month ago    Pain Frequency  Intermittent    Aggravating Factors   tension, turning certain ways    Pain Relieving Factors  reports that she is going to try heat                        [facility] Adult PT Treatment/Exercise - 05/29/18 0917      Ambulation/Gait   Ambulation/Gait  Yes    Ambulation/Gait Assistance  5: Supervision;6: Modified independent (Device/Increase time)    Ambulation/Gait Assistance Details  Pt safe to ambulate with RW at mod I level however needs S due to vision to provide min guiding cues.  Pt also given min cues for upright posture, but she tolerated gait around building on sidewalk without difficulty.  Recommend that she do this at home as PT spoke with daughter regarding getting walker and having either daughter or granddaughter with her to guide  due to visual deficits. Both verbalized understanding.  Pts O2sats were 98% following ambulation and HR 66.      Ambulation Distance (Feet)  1200 Feet    Assistive device  Rolling walker    Gait Pattern  Step-through pattern;Decreased stride length;Poor foot clearance - left;Poor foot clearance - right    Ambulation Surface  Level;Unlevel;Indoor;Outdoor;Paved      High Level Balance   High Level Balance Activities  Marching forwards    High Level Balance Comments  Reviewed three balance exercises from HEP.  Performed marching forwards x4 reps along counter top with cues for increased hip and knee flexion.  Standing hip abd x 10 reps on each side with cues for posture and decreased UE support and tandem stance x 2 reps of 15 secs on each side.  Pt tolerated well stating "I'm working muscles I didn't know I had".        Self-Care   Self-Care  Other Self-Care Comments    Other Self-Care Comments   Had frank discussion with pt and daughter during session regarding the need to see progress and carryover with increased compliance with HEP.  Pt and daughter still have not gotten RW, therefore she is not walking for exercise and is also not doing exercises and daughter requesting another handout.  Educated that we would continue for the next two weeks as long as pt willing to try and do more at home (getting walker today?) and would assess progress following those two weeks with likely D/C following that.  Both pt and daughter verbalized understanding.              PT Education - 05/29/18 0939    Education Details  see self care    Person(s) Educated  Patient;Child(ren)    Methods  Explanation;Demonstration;Handout    Comprehension  Verbalized understanding;Returned demonstration       PT Short Term Goals - 05/05/18 1129      PT SHORT TERM GOAL #1   Title  Patient will perform basic HEP (with family assist due to decr cognition) to address cervicogenic headaches and decreased balance (Target STGs 05/17/2018)    Time  4    Period  Weeks    Status  On-going      PT SHORT TERM GOAL #2   Title  Patient will have increased right cervical lateral flexion and bil cervial rotation by 10 degrees demonstrating decreased muscular tension/spasms.     Time  4    Period  Weeks    Status  On-going      PT SHORT TERM GOAL #3   Title  Patient will complete Berg Balance asssessment to establish baseline and assist with formulating HEP to address decreased balance.     Baseline  05/05/18 Sharlene Motts 38/56; gait speed 1.77 ft/sec; SLS B ~1-2 seconds; tandem stance B ~2-3 sec    Time  4    Period  Weeks    Status  Achieved        PT Long Term Goals - 05/05/18 1130      PT LONG TERM GOAL #1   Title  Patient will perform updated/finalized HEP (with family assist) to address cervicogenic headaches and decreased  balance (Target 06/16/2018)    Time  8    Period  Weeks    Status  On-going      PT LONG TERM GOAL #2   Title  Patient will report <= one headache per week with max intensity <=4/10.  Time  8    Period  Weeks    Status  On-going      PT LONG TERM GOAL #3   Title  Patient will improve Berg Balance score by 7 points compared to measure at 4 weeks, demonstrating reduced fall risk    Time  8    Period  Weeks    Status  On-going      PT LONG TERM GOAL #4   Title  Patient to improve gait speed to >/= 2.62 ft/sec demonstrating improved functional mobility    Baseline  1.77 ft/sec    Time  8    Period  Weeks    Status  New            Plan - 05/29/18 0939    Clinical Impression Statement  Skilled session focused on education/discussion with pt and daughter regarding the need to demonstrate progress to continue with therapy.  Pts daughter reporting that she doesn't want to be here but likes to have her come.  Pt reporting that she would like to continue through POC to try to increase activity at home.  Also had pt ambulate >1000' outdoors over unlevel paved surfaces which she was able to do at S level with good improvement in posture and good gait speed.  Enouraged her (and daughter) to help with this at home as this may be more motivating for pt to participate in.      Rehab Potential  Good    PT Frequency  2x / week    PT Duration  8 weeks    PT Treatment/Interventions  ADLs/Self Care Home Management;Biofeedback;Cryotherapy;Electrical Stimulation;Gait training;DME Instruction;Ultrasound;Traction;Moist Heat;Stair training;Functional mobility training;Therapeutic activities;Therapeutic exercise;Balance training;Neuromuscular re-education;Cognitive remediation;Manual techniques;Patient/family education;Passive range of motion;Dry needling    PT Next Visit Plan  Have daughter come back or at least ask if they have been more compliant with HEP and using RW for exercise in community.  Work  towards LTG for D/C in two weeks.     Consulted and Agree with Plan of Care  Patient       Patient will benefit from skilled therapeutic intervention in order to improve the following deficits and impairments:  Decreased balance, Decreased cognition, Decreased mobility, Decreased knowledge of use of DME, Decreased range of motion, Decreased safety awareness, Difficulty walking, Increased fascial restricitons, Increased muscle spasms, Impaired flexibility, Postural dysfunction, Impaired vision/preception, Pain  Visit Diagnosis: Unsteadiness on feet  Abnormal posture  Other abnormalities of gait and mobility  Cervicalgia     Problem List Patient Active Problem List   Diagnosis Date Noted  . Headache 06/30/2017  . Cervical osteoarthritis 02/17/2017  . Mild cognitive impairment 08/05/2016  . Stress incontinence 06/20/2016  . Calcification of aorta (HCC) 04/15/2016  . At high risk for falls 04/15/2016  . Hypokalemia 03/07/2016  . Health care maintenance 09/21/2014  . 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 Glaucoma 04/22/2007  . Essential hypertension 04/22/2007    Harriet Butte, PT, MPT [facility] [address] [place], [place], [address] Phone: [phone]   Fax:  [phone] 05/29/18, 9:44 AM  Name: Valma Rotenberg MRN: [id number] Date of Birth: 02-Sep-1935

## 2018-05-29 NOTE — Patient Instructions (Signed)
**  Stretches are easier when neck in warmed up (either heating pad or after warm shower).      Lie on your back with a thin pillow or folded towel under your head only (try not to have it under your neck).  Tuck chin SLIGHTLY toward chest, as if making a double chin. You should feel the back of your neck press and get longer. Feel weight on back of head--not lifting your head. Hold _30__ seconds. Relax for 10 seconds. Repeat _3__ times. REPEAT entire set 3 times per day.   Upper Cervical Rotation    Lie down with thin pillow or folded towel under your head. Turn head slowly to one side until you feel a mild stretch. Stop and hold for 30 seconds. Then try to turn a little bit farther and hold 30 seconds. Again turn a little bit farther and hold 30 seconds. Then turn back to the middle and relax. Repeat entire sequence to the other side. Keep motions small. You should only turn until you feel a MILD stretch.    Do __3__ sessions per day.  HIP: Abduction - Standing    Stand at counter top or behind chair.  Try to use only one hand for support.  Squeeze glutes. Raise leg out and slightly back. __10_ reps per set, _1__ sets per day, _5-7_ days per week Hold onto a support.  Copyright  VHI. All rights reserved.   Marching In-Place    Stand beside counter top with support from hand (be light).  March forwards to end of counter top then backwards.  Stay tall!! March slowly and bring knees as high as you can.  Do 3 laps in the kitchen.  1 time per day.   Copyright  VHI. All rights reserved.   Tandem Stance    Stand beside counter top for support as needed. Right foot in front of left, heel touching toe both feet "straight ahead". Stand on Foot Triangle of Support with both feet. Balance in this position __15_ seconds. Do with left foot in front of right. Do 3 times on each side. 1 time per day.   Copyright  VHI. All rights reserved.                                (Exercise) Monday Tuesday Wednesday Thursday Friday Saturday Sunday   Cervical rotation           Cervical retraction           Standing hip abduction           Marching           Tandem Stance                                                        

## 2018-06-01 ENCOUNTER — Encounter: Payer: Self-pay | Admitting: Rehabilitation

## 2018-06-01 ENCOUNTER — Ambulatory Visit: Payer: Medicare Other | Admitting: Rehabilitation

## 2018-06-01 DIAGNOSIS — R2681 Unsteadiness on feet: Secondary | ICD-10-CM | POA: Diagnosis not present

## 2018-06-01 DIAGNOSIS — R2689 Other abnormalities of gait and mobility: Secondary | ICD-10-CM

## 2018-06-01 DIAGNOSIS — R293 Abnormal posture: Secondary | ICD-10-CM

## 2018-06-01 DIAGNOSIS — M542 Cervicalgia: Secondary | ICD-10-CM

## 2018-06-01 NOTE — Therapy (Signed)
[facility] [address] [place], [place], [address] Phone: [phone]   Fax:  [phone]  Physical Therapy Treatment  Patient Details  Name: [name] MRN: [id number] Date of Birth: October 14, 1935 Referring Provider (PT): 02/03/18 Maryellen Pile MD (fall); 03/26/18 Metta Clines DO (cervicogenic headache)   Encounter Date: 06/01/2018  PT End of Session - 06/01/18 0938    Visit Number  6    Number of Visits  17    Date for PT Re-Evaluation  06/16/18    Authorization Type  Medicare; [employer] (no PTAs)    Authorization Time Period  04/17/18 to 07/16/2018    PT Start Time  0933    PT Stop Time  1015    PT Time Calculation (min)  42 min    Activity Tolerance  Patient tolerated treatment well    Behavior During Therapy  [facility] for tasks assessed/performed       Past Medical History:  Diagnosis Date  . 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 Left eye sees shadows and is bothered by bright lights. She wears specialty sunglasses which help her with sensitivity.   [name] CAD (coronary artery disease)    nonobstructive, myoview normal 03/2007, done by Dr. Verl Blalock - EF = 75%  . Glaucoma   . HLD (hyperlipidemia)   . Hypertension     History reviewed. No pertinent surgical history.  There were no vitals filed for this visit.  Subjective Assessment - 06/01/18 0936    Subjective  Pt reports 5/10 headache today.  Reports this is her "usual" pain level.  Pt/daughter asked about RW and they report they have RW but did not bring.  Provided education that RW is for her to use at all times when she has S to improved safety.     Pertinent History  Blindness of right eye (enucleated with prosthesis) with low vision in contralateral eye; glaucoma; photosensitivity; CAD, HTN, HLD    Diagnostic tests  MRI neck 07/2017  "Multilevel disc degeneration and spondylosis with a chronic appearance. Spinal and foraminal stenosis at C3-4, C4-5, C5-6"    Patient Stated Goals  reduce her headaches; reduce her risk of falling    Currently in Pain?  Yes    Pain Score  5     Pain Location  Head    Pain Orientation  Right;Left    Pain Descriptors / Indicators  Aching    Pain Type  Chronic pain    Pain Onset  More than a month ago    Pain Frequency  Intermittent    Aggravating Factors   tension     Pain Relieving Factors  nothing         [facility] PT Assessment - 06/01/18 0945      ROM / Strength   AROM / PROM / Strength  AROM      AROM   Overall AROM   Deficits    AROM Assessment Site  Cervical    Cervical - Right Side Bend  20    Cervical - Left Side Bend  22    Cervical - Right Rotation  30    Cervical - Left Rotation  25                   [facility] Adult PT Treatment/Exercise - 06/01/18 4818  Ambulation/Gait   Ambulation/Gait  Yes    Ambulation/Gait Assistance  6: Modified independent (Device/Increase time);5: Supervision    Ambulation/Gait Assistance Details  S for guiding cues throughout session but continue to note improved posture with use of RW.  Continue to recommend use of RW for community and exercise.  Pt and daughter verbalize understanding.      Ambulation Distance (Feet)  400 Feet   throughout session   Assistive device  Rolling walker    Gait Pattern  Step-through pattern;Decreased stride length;Poor foot clearance - left;Poor foot clearance - right    Ambulation Surface  Level;Indoor      Neuro Re-ed    Neuro Re-ed Details   High level balance for stepping strategy in // bars; standing on foam balance beam with feet apart maintaining balance x 2 sets of 20 secs progressing to feet apart stepping forward alternating LEs x 10 reps then backwards x 10 reps with return to middle with intermittent UE support.  Standing on ramp incline with feet staggered x 20 secs each.  Feet together  moving head side to side (within pain free range) x 10 reps and up/down x 10 reps.  Pt tolerated all well with intermittent min/guard A.        Exercises   Other Exercises   Standing trunk and shoulder extension into wall x 3 reps with 10 sec hold with cues for neck retraction.       Neck Exercises: Supine   Other Supine Exercise  supine towel stretch for anterior chest x 2 mins with arms in "T" position      Manual Therapy   Manual Therapy  Soft tissue mobilization;Manual Traction;Myofascial release    Manual therapy comments  Hooklying manual therapy to cervical and paracervical region to reduce pain and improve flexibility. Pt reports decrease in headache from 5/10 to 1/10 following manual therapy.  However once back in standing, pt c/o increased headache again.      Soft tissue mobilization  STM to B UT, B infra, B cervical paraspinals, B suboccipital mm.    Myofascial Release  suboccipital release x 2 sets of 1 min    Manual Traction  manual traction 2 x 30 seconds             PT Education - 06/01/18 0937    Education Details  education to pt and daughter that RW is to be used when out of home to increase safety     Person(s) Educated  Patient;Child(ren)    Methods  Explanation    Comprehension  Verbalized understanding       PT Short Term Goals - 06/01/18 3545      PT SHORT TERM GOAL #1   Title  Patient will perform basic HEP (with family assist due to decr cognition) to address cervicogenic headaches and decreased balance (Target STGs 05/17/2018)    Baseline  Pt/daughter still report they are mostly non-compliant with HEP, even with grid given by PT>     Time  4    Period  Weeks    Status  Not Met      PT SHORT TERM GOAL #2   Title  Patient will have increased right cervical lateral flexion and bil cervial rotation by 10 degrees demonstrating decreased muscular tension/spasms.     Baseline  25 deg L rotation, 30 deg  rotation, 20 deg R lateral flex, 22 deg L lateral  flex     Time  4  Period  Weeks    Status  Not Met      PT SHORT TERM GOAL #3   Title  Patient will complete Berg Balance asssessment to establish baseline and assist with formulating HEP to address decreased balance.     Baseline  05/05/18 Merrilee Jansky 38/56; gait speed 1.77 ft/sec; SLS B ~1-2 seconds; tandem stance B ~2-3 sec    Time  4    Period  Weeks    Status  Achieved        PT Long Term Goals - 05/05/18 1130      PT LONG TERM GOAL #1   Title  Patient will perform updated/finalized HEP (with family assist) to address cervicogenic headaches and decreased balance (Target 06/16/2018)    Time  8    Period  Weeks    Status  On-going      PT LONG TERM GOAL #2   Title  Patient will report <= one headache per week with max intensity <=4/10.     Time  8    Period  Weeks    Status  On-going      PT LONG TERM GOAL #3   Title  Patient will improve Berg Balance score by 7 points compared to measure at 4 weeks, demonstrating reduced fall risk    Time  8    Period  Weeks    Status  On-going      PT LONG TERM GOAL #4   Title  Patient to improve gait speed to >/= 2.62 ft/sec demonstrating improved functional mobility    Baseline  1.77 ft/sec    Time  8    Period  Weeks    Status  New            Plan - 06/01/18 1610    Clinical Impression Statement  Pt progress continues to be limited due to poor cognition and carryover at home.  Note that her cervical ROM is approximately the same as baseline with continued pain.  Pt reports relief following manual therapy, but once in standing, reports continued headache.  Will continue to work towards [place].     Rehab Potential  Good    PT Frequency  2x / week    PT Duration  8 weeks    PT Treatment/Interventions  ADLs/Self Care Home Management;Biofeedback;Cryotherapy;Electrical Stimulation;Gait training;DME Instruction;Ultrasound;Traction;Moist Heat;Stair training;Functional mobility training;Therapeutic activities;Therapeutic  exercise;Balance training;Neuromuscular re-education;Cognitive remediation;Manual techniques;Patient/family education;Passive range of motion;Dry needling    PT Next Visit Plan  Have daughter come back or at least ask if they have been more compliant with HEP and using RW for exercise in community.  Work towards LTG for D/C in two weeks.     Consulted and Agree with Plan of Care  Patient       Patient will benefit from skilled therapeutic intervention in order to improve the following deficits and impairments:  Decreased balance, Decreased cognition, Decreased mobility, Decreased knowledge of use of DME, Decreased range of motion, Decreased safety awareness, Difficulty walking, Increased fascial restricitons, Increased muscle spasms, Impaired flexibility, Postural dysfunction, Impaired vision/preception, Pain  Visit Diagnosis: Unsteadiness on feet  Abnormal posture  Other abnormalities of gait and mobility  Cervicalgia     Problem List Patient Active Problem List   Diagnosis Date Noted  . Headache 06/30/2017  . Cervical osteoarthritis 02/17/2017  . Mild cognitive impairment 08/05/2016  . Stress incontinence 06/20/2016  . Calcification of aorta (HCC) 04/15/2016  . At high risk for falls 04/15/2016  .  Hypokalemia 03/07/2016  . Health care maintenance 09/21/2014  . 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 Glaucoma 04/22/2007  . Essential hypertension 04/22/2007   Cameron Sprang, PT, MPT [facility] [address] [place], [place], [address] Phone: [phone]   Fax:  [phone] 06/01/18, 10:42 AM  Name: Kaili Castille MRN: [id number] Date of Birth: 28-Sep-1935

## 2018-06-02 ENCOUNTER — Telehealth: Payer: Self-pay | Admitting: *Deleted

## 2018-06-02 NOTE — Telephone Encounter (Signed)
Call from pt's daughter. I talked to the pt who stated she slipped and felled in the bathtub this morning. Stated she hit her lower back/"bottom" area. She did not hit her head or anywhere else. Stated she's "moving okay"; "sure I will be sore tomorrow". Offer her an appt to come in today to be evaluated but stated she's ok, just a slight "twinged" in her lower back; stated again she's ok. Instructed if if she experience any increased pain to call us back, stated she will.

## 2018-06-02 NOTE — Telephone Encounter (Signed)
Ok. Thanks for letting me know.

## 2018-06-08 ENCOUNTER — Encounter: Payer: Self-pay | Admitting: Rehabilitation

## 2018-06-08 ENCOUNTER — Ambulatory Visit: Payer: Medicare Other | Admitting: Rehabilitation

## 2018-06-08 DIAGNOSIS — R2681 Unsteadiness on feet: Secondary | ICD-10-CM

## 2018-06-08 DIAGNOSIS — M542 Cervicalgia: Secondary | ICD-10-CM | POA: Diagnosis not present

## 2018-06-08 DIAGNOSIS — R2689 Other abnormalities of gait and mobility: Secondary | ICD-10-CM | POA: Diagnosis not present

## 2018-06-08 DIAGNOSIS — R293 Abnormal posture: Secondary | ICD-10-CM | POA: Diagnosis not present

## 2018-06-08 NOTE — Therapy (Signed)
[facility] [address] [place], [place], [address] Phone: [phone]   Fax:  [phone]  Physical Therapy Treatment  Patient Details  Name: [name] MRN: [id number] Date of Birth: 1936-03-21 Referring Provider (PT): 02/03/18 Maryellen Pile MD (fall); 03/26/18 Metta Clines DO (cervicogenic headache)   Encounter Date: 06/08/2018  PT End of Session - 06/08/18 1106    Visit Number  7    Number of Visits  17    Date for PT Re-Evaluation  06/16/18    Authorization Type  Medicare; [employer] (no PTAs)    Authorization Time Period  04/17/18 to 07/16/2018    PT Start Time  1101    PT Stop Time  1145    PT Time Calculation (min)  44 min    Activity Tolerance  Patient tolerated treatment well    Behavior During Therapy  [facility] for tasks assessed/performed       Past Medical History:  Diagnosis Date  . 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 Left eye sees shadows and is bothered by bright lights. She wears specialty sunglasses which help her with sensitivity.   [name] CAD (coronary artery disease)    nonobstructive, myoview normal 03/2007, done by Dr. Verl Blalock - EF = 75%  . Glaucoma   . HLD (hyperlipidemia)   . Hypertension     History reviewed. No pertinent surgical history.  There were no vitals filed for this visit.  Subjective Assessment - 06/08/18 1104    Subjective  Pt reports improvement in headache today.      Pertinent History  Blindness of right eye (enucleated with prosthesis) with low vision in contralateral eye; glaucoma; photosensitivity; CAD, HTN, HLD    Diagnostic tests  MRI neck 07/2017 "Multilevel disc degeneration and spondylosis with a chronic appearance. Spinal and foraminal stenosis at C3-4, C4-5, C5-6"    Patient Stated Goals  reduce her headaches; reduce her risk of falling     Currently in Pain?  Yes    Pain Score  3     Pain Location  Head    Pain Orientation  Right;Left    Pain Descriptors / Indicators  Aching    Pain Type  Chronic pain    Pain Onset  More than a month ago    Pain Frequency  Intermittent    Aggravating Factors   tension    Pain Relieving Factors  heat                       [facility] Adult PT Treatment/Exercise - 06/08/18 1116      Ambulation/Gait   Ambulation/Gait  Yes    Ambulation/Gait Assistance  6: Modified independent (Device/Increase time)    Ambulation/Gait Assistance Details  Gait x 300' with RW at mod I level.  Pt able to negotiate today without cues.  Did provide min cues for increased gait speed.     Ambulation Distance (Feet)  300 Feet    Assistive device  Rolling walker    Gait Pattern  Step-through pattern;Decreased stride length;Poor foot clearance - left;Poor foot clearance - right    Ambulation Surface  Level;Indoor      Neuro Re-ed    Neuro Re-ed Details   High level balance in // bars to address ankle/hip and step strategy: standing on foam balance beam perpendicularly holding balance  x 2 sets of 20 secs with feet apart EO, progressing to feet together EO x 2 sets of 20 secs.  Standing on small rocker board with feet apart maintaining balance x 20 secs progressing to feet apart EO with head turns up/down (did not do horizontal as this causes pain) x 10 reps all with intermittent UE support.  Continued on small rocker board with single leg in stance tapping opposite leg from floor to chair seat x 10 reps on each side with single UE support as needed, standing on small rocker board performing hip abd x 10 reps on each side with single UE support and cues for posture and slower motion.  Standing on foam airex tapping to cones alternating LEs x 15 reps on each side with emphasis on slower motion and decreasing UE support from BUE>single UE>no UE support.  Standing on foam therapy mat on ramp marching x 20 reps with  min/guard A, staggered stance x 2 reps of 20 secs each direction with intermittent min/guard as needed.  Braiding in // bars x 6 laps decreasing UE support       Exercises   Exercises  Knee/Hip      Knee/Hip Exercises: Aerobic   Nustep  BUE/LEs x 4 mins at level 3 resistance maintaining RPM at 50-60's.               PT Education - 06/08/18 1144    Education Details  plan for D/C next week    Person(s) Educated  Patient    Methods  Explanation    Comprehension  Verbalized understanding       PT Short Term Goals - 06/01/18 0938      PT SHORT TERM GOAL #1   Title  Patient will perform basic HEP (with family assist due to decr cognition) to address cervicogenic headaches and decreased balance (Target STGs 05/17/2018)    Baseline  Pt/daughter still report they are mostly non-compliant with HEP, even with grid given by PT>     Time  4    Period  Weeks    Status  Not Met      PT SHORT TERM GOAL #2   Title  Patient will have increased right cervical lateral flexion and bil cervial rotation by 10 degrees demonstrating decreased muscular tension/spasms.     Baseline  25 deg L rotation, 30 deg  rotation, 20 deg R lateral flex, 22 deg L lateral flex     Time  4    Period  Weeks    Status  Not Met      PT SHORT TERM GOAL #3   Title  Patient will complete Berg Balance asssessment to establish baseline and assist with formulating HEP to address decreased balance.     Baseline  05/05/18 Merrilee Jansky 38/56; gait speed 1.77 ft/sec; SLS B ~1-2 seconds; tandem stance B ~2-3 sec    Time  4    Period  Weeks    Status  Achieved        PT Long Term Goals - 05/05/18 1130      PT LONG TERM GOAL #1   Title  Patient will perform updated/finalized HEP (with family assist) to address cervicogenic headaches and decreased balance (Target 06/16/2018)    Time  8    Period  Weeks    Status  On-going      PT LONG TERM GOAL #2   Title  Patient will report <= one headache per week with max  intensity  <=4/10.     Time  8    Period  Weeks    Status  On-going      PT LONG TERM GOAL #3   Title  Patient will improve Berg Balance score by 7 points compared to measure at 4 weeks, demonstrating reduced fall risk    Time  8    Period  Weeks    Status  On-going      PT LONG TERM GOAL #4   Title  Patient to improve gait speed to >/= 2.62 ft/sec demonstrating improved functional mobility    Baseline  1.77 ft/sec    Time  8    Period  Weeks    Status  New            Plan - 06/08/18 1106    Clinical Impression Statement  Skilled session focused on high level balance to elicit ankle, hip and stepping strategy along with standing on compliant surfaces.  Continue to focus on gait with RW (she did bring her own to session today).  Pt progressing well and should be ready for D/C next week.     Rehab Potential  Good    PT Frequency  2x / week    PT Duration  8 weeks    PT Treatment/Interventions  ADLs/Self Care Home Management;Biofeedback;Cryotherapy;Electrical Stimulation;Gait training;DME Instruction;Ultrasound;Traction;Moist Heat;Stair training;Functional mobility training;Therapeutic activities;Therapeutic exercise;Balance training;Neuromuscular re-education;Cognitive remediation;Manual techniques;Patient/family education;Passive range of motion;Dry needling    PT Next Visit Plan  work towards [place] and D/C on 10/22    Consulted and Agree with Plan of Care  Patient       Patient will benefit from skilled therapeutic intervention in order to improve the following deficits and impairments:  Decreased balance, Decreased cognition, Decreased mobility, Decreased knowledge of use of DME, Decreased range of motion, Decreased safety awareness, Difficulty walking, Increased fascial restricitons, Increased muscle spasms, Impaired flexibility, Postural dysfunction, Impaired vision/preception, Pain  Visit Diagnosis: Unsteadiness on feet  Abnormal posture  Other abnormalities of gait and  mobility     Problem List Patient Active Problem List   Diagnosis Date Noted  . Headache 06/30/2017  . Cervical osteoarthritis 02/17/2017  . Mild cognitive impairment 08/05/2016  . Stress incontinence 06/20/2016  . Calcification of aorta (HCC) 04/15/2016  . At high risk for falls 04/15/2016  . Hypokalemia 03/07/2016  . Health care maintenance 09/21/2014  . 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 Glaucoma 04/22/2007  . Essential hypertension 04/22/2007    Cameron Sprang, PT, MPT [facility] [address] [place], [place], [address] Phone: [phone]   Fax:  [phone] 06/08/18, 1:07 PM  Name: [name] MRN: [id number] Date of Birth: 09/06/1935

## 2018-06-15 ENCOUNTER — Encounter: Payer: Self-pay | Admitting: Rehabilitation

## 2018-06-15 ENCOUNTER — Ambulatory Visit: Payer: Medicare Other | Admitting: Rehabilitation

## 2018-06-15 DIAGNOSIS — R293 Abnormal posture: Secondary | ICD-10-CM | POA: Diagnosis not present

## 2018-06-15 DIAGNOSIS — R2689 Other abnormalities of gait and mobility: Secondary | ICD-10-CM

## 2018-06-15 DIAGNOSIS — R2681 Unsteadiness on feet: Secondary | ICD-10-CM

## 2018-06-15 DIAGNOSIS — M542 Cervicalgia: Secondary | ICD-10-CM | POA: Diagnosis not present

## 2018-06-15 NOTE — Patient Instructions (Addendum)
Have [name] stand in a corner (or make a corner by closing a door) and stand on either a moderately sized pillow or two thin pillows to take the firmness of the ground out.  Also place a chair in front of her for safety so she can use hands if needed.     Feet Together (Compliant Surface) Head Motion - Eyes Closed    Stand on compliant surface: ____pillow____ with feet together. Close eyes and move head slowly, up and down x 10 reps and side to side (as far as she can go comfortably) x 10 reps.  Repeat _1___ times per session. Do __1-2__ sessions per day.  Copyright  VHI. All rights reserved.     (Exercise) Monday Tuesday Wednesday Thursday Friday Saturday Sunday   Cervical rotation           Cervical retraction           Standing hip abduction           Marching           Tandem Stance           Corner balance

## 2018-06-15 NOTE — Therapy (Signed)
[facility] [address] [place], [place], [address] Phone: [phone]   Fax:  [phone]  Physical Therapy Treatment  Patient Details  Name: [name] MRN: [id number] Date of Birth: 1936/03/15 Referring Provider (PT): 02/03/18 Maryellen Pile MD (fall); 03/26/18 Metta Clines DO (cervicogenic headache)   Encounter Date: 06/15/2018  PT End of Session - 06/15/18 1021    Visit Number  8    Number of Visits  17    Date for PT Re-Evaluation  06/16/18    Authorization Type  Medicare; [employer] (no PTAs)    Authorization Time Period  04/17/18 to 07/16/2018    PT Start Time  1005    PT Stop Time  1050    PT Time Calculation (min)  45 min    Activity Tolerance  Patient tolerated treatment well    Behavior During Therapy  [facility] for tasks assessed/performed       Past Medical History:  Diagnosis Date  . 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 Left eye sees shadows and is bothered by bright lights. She wears specialty sunglasses which help her with sensitivity.   [name] CAD (coronary artery disease)    nonobstructive, myoview normal 03/2007, done by Dr. Verl Blalock - EF = 75%  . Glaucoma   . HLD (hyperlipidemia)   . Hypertension     History reviewed. No pertinent surgical history.  There were no vitals filed for this visit.  Subjective Assessment - 06/15/18 1005    Subjective  Pt did endorse fall two weeks ago when asked by PT.  Unsure of how it happened, but fell in shower.      Pertinent History  Blindness of right eye (enucleated with prosthesis) with low vision in contralateral eye; glaucoma; photosensitivity; CAD, HTN, HLD    Diagnostic tests  MRI neck 07/2017 "Multilevel disc degeneration and spondylosis with a chronic appearance. Spinal and foraminal stenosis at C3-4, C4-5, C5-6"    Patient Stated Goals   reduce her headaches; reduce her risk of falling    Currently in Pain?  No/denies                       [facility] Adult PT Treatment/Exercise - 06/15/18 1008      Ambulation/Gait   Ambulation/Gait  Yes    Ambulation/Gait Assistance  6: Modified independent (Device/Increase time)    Ambulation/Gait Assistance Details  Pt did not need guiding cues today (except for safety with curb step due to visual deficits) when ambulating outdoors with RW.  Pt able to ambluate >1000' over unlevel paved surfaces.  Continue to encourage her to do at home with daughter/granddaughter daily.  Pt verbalized understanding.     Ambulation Distance (Feet)  1200 Feet    Assistive device  Rolling walker    Gait Pattern  Step-through pattern;Decreased stride length;Poor foot clearance - left;Poor foot clearance - right    Ambulation Surface  Unlevel;Outdoor;Paved    Curb  5: Supervision    Curb Details (indicate cue type and reason)  Min cues due to visual deficits      Standardized Balance Assessment   Standardized Balance Assessment  Berg Balance Test      Berg Balance Test   Sit to Stand  Able to stand without using hands and stabilize independently    Standing Unsupported  Able  to stand safely 2 minutes    Sitting with Back Unsupported but Feet Supported on Floor or Stool  Able to sit safely and securely 2 minutes    Stand to Sit  Sits safely with minimal use of hands    Transfers  Able to transfer safely, minor use of hands    Standing Unsupported with Eyes Closed  Able to stand 10 seconds safely    Standing Ubsupported with Feet Together  Able to place feet together independently and stand 1 minute safely    From Standing, Reach Forward with Outstretched Arm  Can reach confidently >25 cm (10")    From Standing Position, Pick up Object from Floor  Able to pick up shoe safely and easily    From Standing Position, Turn to Look Behind Over each Shoulder  Looks behind from both sides and weight  shifts well    Turn 360 Degrees  Able to turn 360 degrees safely in 4 seconds or less    Standing Unsupported, Alternately Place Feet on Step/Stool  Able to complete 4 steps without aid or supervision    Standing Unsupported, One Foot in Front  Able to plae foot ahead of the other independently and hold 30 seconds    Standing on One Leg  Tries to lift leg/unable to hold 3 seconds but remains standing independently    Total Score  50      Neuro Re-ed    Neuro Re-ed Details   Corner balance tasks standing on compliant surface (stacked pillows); feet apart EO with head turns up/down and side/side x 10 reps each, feet apart EC x 2 sets of 20 secs, feet together EO maintaining balance x 20 secs, feet together EO with head turns up/down and side/side x 10 reps each, feet together EC x 2 sets of 20 secs, feet together EC with head turns up/down and side/side x 10 reps each.  Added last exercise to HEP.  Re-printed this exercise along with grid and provided to granddaughter during session for improved carryover.               PT Education - 06/15/18 1129    Education Details  additional exercise for HEP    Person(s) Educated  Patient;Other (comment)   granddaughter   Methods  Explanation;Demonstration;Handout    Comprehension  Verbalized understanding;Returned demonstration       PT Short Term Goals - 06/01/18 3235      PT SHORT TERM GOAL #1   Title  Patient will perform basic HEP (with family assist due to decr cognition) to address cervicogenic headaches and decreased balance (Target STGs 05/17/2018)    Baseline  Pt/daughter still report they are mostly non-compliant with HEP, even with grid given by PT>     Time  4    Period  Weeks    Status  Not Met      PT SHORT TERM GOAL #2   Title  Patient will have increased right cervical lateral flexion and bil cervial rotation by 10 degrees demonstrating decreased muscular tension/spasms.     Baseline  25 deg L rotation, 30 deg  rotation, 20  deg R lateral flex, 22 deg L lateral flex     Time  4    Period  Weeks    Status  Not Met      PT SHORT TERM GOAL #3   Title  Patient will complete Berg Balance asssessment to establish baseline and assist with formulating HEP to address  decreased balance.     Baseline  05/05/18 Merrilee Jansky 38/56; gait speed 1.77 ft/sec; SLS B ~1-2 seconds; tandem stance B ~2-3 sec    Time  4    Period  Weeks    Status  Achieved        PT Long Term Goals - 06/15/18 1021      PT LONG TERM GOAL #1   Title  Patient will perform updated/finalized HEP (with family assist) to address cervicogenic headaches and decreased balance (Target 06/16/2018)    Time  8    Period  Weeks    Status  On-going      PT LONG TERM GOAL #2   Title  Patient will report <= one headache per week with max intensity <=4/10.     Baseline  met per verbal report (unsure of reliability due to poor cognition)    Time  8    Period  Weeks    Status  Achieved      PT LONG TERM GOAL #3   Title  Patient will improve Berg Balance score by 7 points compared to measure at 4 weeks, demonstrating reduced fall risk    Baseline  met (score went from 38/56 to 50/56 on 06/15/18    Time  8    Period  Weeks    Status  Achieved      PT LONG TERM GOAL #4   Title  Patient to improve gait speed to >/= 2.62 ft/sec demonstrating improved functional mobility    Baseline  1.77 ft/sec    Time  8    Period  Weeks    Status  New            Plan - 06/15/18 1131    Clinical Impression Statement  Skilled session began to look at [place] for D/C at next visit.  BERG balance improved from 38/56 to 50/56 indicating low fall risk and improved overall balance.  Also continue to work on high level balance with multi-sensory challenges in corner.  Added single exercise and provided to granddaughter during session.     Rehab Potential  Good    PT Frequency  2x / week    PT Duration  8 weeks    PT Treatment/Interventions  ADLs/Self Care Home  Management;Biofeedback;Cryotherapy;Electrical Stimulation;Gait training;DME Instruction;Ultrasound;Traction;Moist Heat;Stair training;Functional mobility training;Therapeutic activities;Therapeutic exercise;Balance training;Neuromuscular re-education;Cognitive remediation;Manual techniques;Patient/family education;Passive range of motion;Dry needling    PT Next Visit Plan  remaining LTGs and D/C    Consulted and Agree with Plan of Care  Patient       Patient will benefit from skilled therapeutic intervention in order to improve the following deficits and impairments:  Decreased balance, Decreased cognition, Decreased mobility, Decreased knowledge of use of DME, Decreased range of motion, Decreased safety awareness, Difficulty walking, Increased fascial restricitons, Increased muscle spasms, Impaired flexibility, Postural dysfunction, Impaired vision/preception, Pain  Visit Diagnosis: Unsteadiness on feet  Abnormal posture  Other abnormalities of gait and mobility     Problem List Patient Active Problem List   Diagnosis Date Noted  . Headache 06/30/2017  . Cervical osteoarthritis 02/17/2017  . Mild cognitive impairment 08/05/2016  . Stress incontinence 06/20/2016  . Calcification of aorta (HCC) 04/15/2016  . At high risk for falls 04/15/2016  . Hypokalemia 03/07/2016  . Health care maintenance 09/21/2014  . 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 Glaucoma 04/22/2007  . Essential hypertension 04/22/2007   Cameron Sprang, PT, MPT [facility]  [facility] [address] [place], [place], [address] Phone: [phone]   Fax:  [phone] 06/15/18, 12:43 PM   Name: [name] MRN: [id number] Date of Birth: 14-Apr-1936

## 2018-06-16 ENCOUNTER — Ambulatory Visit: Payer: Medicare Other | Admitting: Physical Therapy

## 2018-06-19 ENCOUNTER — Encounter: Payer: Self-pay | Admitting: Rehabilitation

## 2018-06-19 ENCOUNTER — Ambulatory Visit: Payer: Medicare Other | Admitting: Rehabilitation

## 2018-06-19 DIAGNOSIS — R293 Abnormal posture: Secondary | ICD-10-CM

## 2018-06-19 DIAGNOSIS — R2689 Other abnormalities of gait and mobility: Secondary | ICD-10-CM

## 2018-06-19 DIAGNOSIS — R2681 Unsteadiness on feet: Secondary | ICD-10-CM | POA: Diagnosis not present

## 2018-06-19 DIAGNOSIS — M542 Cervicalgia: Secondary | ICD-10-CM

## 2018-06-19 NOTE — Patient Instructions (Addendum)
**  Stretches are easier when neck in warmed up (either heating pad or after warm shower).      Lie on your back with a thin pillow or folded towel under your head only (try not to have it under your neck).Tuck chin SLIGHTLY toward chest,as if making a double chin. You should feel the back of your neck press and get longer.Feel weight on back of head--not lifting your head. Hold _30__ seconds. Relaxfor 10 seconds. Repeat _3__ times.REPEAT entire set 3 times per day.  Upper Cervical Rotation    Lie down with thin pillow or folded towel under your head. Turnhead slowly to onesideuntil you feel a mild stretch. Stop and hold for 30 seconds. Then try to turn a little bit farther and hold 30 seconds. Again turn a little bit farther and hold 30 seconds. Then turn back to the middle and relax. Repeat entire sequence to the other side.Keep motionssmall. You should only turn until you feel a MILD stretch.  Do __3__ sessions per day.  HIP: Abduction - Standing    Stand at counter top or behind chair. Try to use only one hand for support. Squeeze glutes. Raise leg out and slightly back. __10_ reps per set, _1__ sets per day, _5-7_ days per week Hold onto a support.  Copyright  VHI. All rights reserved.  Marching In-Place    Stand beside counter top with support from hand (be light). March forwards to end of counter top then backwards. Stay tall!! March slowly and bring knees as high as you can. Do 3 laps in the kitchen. 1 time per day.   Copyright  VHI. All rights reserved.  Tandem Stance    Stand beside counter top for support as needed. Right foot in front of left, heel touching toe both feet "straight ahead". Stand on Foot Triangle of Support with both feet. Balance in this position __15_ seconds. Do with left foot in front of right. Do 3 times on each side. 1 time per day.   Copyright  VHI. All rights reserved.    Feet Together (Compliant Surface)  Head Motion - Eyes Closed    Stand on compliant surface: ____pillow____ with feet together. Close eyes and move head slowly, up and down x 10 reps and side to side (as far as she can go comfortably) x 10 reps.  Repeat _1___ times per session. Do __1-2__ sessions per day.

## 2018-06-19 NOTE — Therapy (Signed)
[facility] [address] [place], [place], [address] Phone: [phone]   Fax:  [phone]  Physical Therapy Treatment and D/C Summary   Patient Details  Name: [name] MRN: [id number] Date of Birth: 10/09/1935 Referring Provider (PT): 02/03/18 Maryellen Pile MD (fall); 03/26/18 Metta Clines DO (cervicogenic headache)   Encounter Date: 06/19/2018  PT End of Session - 06/19/18 0851    Visit Number  9    Number of Visits  17    Date for PT Re-Evaluation  06/16/18    Authorization Type  Medicare; [employer] (no PTAs)    Authorization Time Period  04/17/18 to 07/16/2018    PT Start Time  0849    PT Stop Time  0922   D/C visit, didn't need whole time   PT Time Calculation (min)  33 min    Activity Tolerance  Patient tolerated treatment well    Behavior During Therapy  [facility] for tasks assessed/performed       Past Medical History:  Diagnosis Date  . 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 Left eye sees shadows and is bothered by bright lights. She wears specialty sunglasses which help her with sensitivity.   [name] CAD (coronary artery disease)    nonobstructive, myoview normal 03/2007, done by Dr. Verl Blalock - EF = 75%  . Glaucoma   . HLD (hyperlipidemia)   . Hypertension     History reviewed. No pertinent surgical history.  There were no vitals filed for this visit.  Subjective Assessment - 06/19/18 0850    Subjective  Pt reports feeling well this morning.     Patient is accompained by:  Family member    Pertinent History  Blindness of right eye (enucleated with prosthesis) with low vision in contralateral eye; glaucoma; photosensitivity; CAD, HTN, HLD    Diagnostic tests  MRI neck 07/2017 "Multilevel disc degeneration and spondylosis with a chronic appearance. Spinal and foraminal stenosis at C3-4,  C4-5, C5-6"    Patient Stated Goals  reduce her headaches; reduce her risk of falling    Currently in Pain?  No/denies                       [facility] Adult PT Treatment/Exercise - 06/19/18 0850      Ambulation/Gait   Ambulation/Gait  Yes    Ambulation/Gait Assistance  6: Modified independent (Device/Increase time)    Ambulation Distance (Feet)  200 Feet    Assistive device  Rolling walker    Gait Pattern  Step-through pattern;Decreased stride length;Poor foot clearance - left;Poor foot clearance - right    Ambulation Surface  Level;Indoor    Gait velocity  2.82 ft/sec with RW      Neuro Re-ed    Neuro Re-ed Details   Reviewed HEP, see pt instruction       Exercises   Other Exercises   Reviewed cervcial HEP, see pt instruction         **Stretches are easier when neck in warmed up (either heating pad or after warm shower).      Lie on your back with a thin pillow or folded towel under your head only (try not to have it under your neck).Tuck chin SLIGHTLY toward chest,as if making a double chin. You should feel the back of your neck press and get longer.Feel weight  on back of head--not lifting your head. Hold _30__ seconds. Relaxfor 10 seconds. Repeat _3__ times.REPEAT entire set 3 times per day.  Upper Cervical Rotation    Lie down with thin pillow or folded towel under your head. Turnhead slowly to onesideuntil you feel a mild stretch. Stop and hold for 30 seconds. Then try to turn a little bit farther and hold 30 seconds. Again turn a little bit farther and hold 30 seconds. Then turn back to the middle and relax. Repeat entire sequence to the other side.Keep motionssmall. You should only turn until you feel a MILD stretch.  Do __3__ sessions per day.  HIP: Abduction - Standing    Stand at counter top or behind chair. Try to use only one hand for support. Squeeze glutes. Raise leg out and slightly back. __10_ reps per set, _1__ sets per  day, _5-7_ days per week Hold onto a support.  Copyright  VHI. All rights reserved.  Marching In-Place    Stand beside counter top with support from hand (be light). March forwards to end of counter top then backwards. Stay tall!! March slowly and bring knees as high as you can. Do 3 laps in the kitchen. 1 time per day.   Copyright  VHI. All rights reserved.  Tandem Stance    Stand beside counter top for support as needed. Right foot in front of left, heel touching toe both feet "straight ahead". Stand on Foot Triangle of Support with both feet. Balance in this position __15_ seconds. Do with left foot in front of right. Do 3 times on each side. 1 time per day.   Copyright  VHI. All rights reserved.    Feet Together (Compliant Surface) Head Motion - Eyes Closed    Stand on compliant surface: ____pillow____ with feet together. Close eyes and move head slowly, up and down x 10 reps and side to side (as far as she can go comfortably) x 10 reps.  Repeat _1___ times per session. Do __1-2__ sessions per day.      PT Education - 06/19/18 1237    Education Details  importance of carryover with HEP and walking with RW    Person(s) Educated  Patient    Methods  Explanation    Comprehension  Verbalized understanding       PT Short Term Goals - 06/01/18 2778      PT SHORT TERM GOAL #1   Title  Patient will perform basic HEP (with family assist due to decr cognition) to address cervicogenic headaches and decreased balance (Target STGs 05/17/2018)    Baseline  Pt/daughter still report they are mostly non-compliant with HEP, even with grid given by PT>     Time  4    Period  Weeks    Status  Not Met      PT SHORT TERM GOAL #2   Title  Patient will have increased right cervical lateral flexion and bil cervial rotation by 10 degrees demonstrating decreased muscular tension/spasms.     Baseline  25 deg L rotation, 30 deg  rotation, 20 deg R lateral flex, 22 deg L  lateral flex     Time  4    Period  Weeks    Status  Not Met      PT SHORT TERM GOAL #3   Title  Patient will complete Berg Balance asssessment to establish baseline and assist with formulating HEP to address decreased balance.     Baseline  05/05/18 Merrilee Jansky  38/56; gait speed 1.77 ft/sec; SLS B ~1-2 seconds; tandem stance B ~2-3 sec    Time  4    Period  Weeks    Status  Achieved        PT Long Term Goals - 06/19/18 5456      PT LONG TERM GOAL #1   Title  Patient will perform updated/finalized HEP (with family assist) to address cervicogenic headaches and decreased balance (Target 06/16/2018)    Baseline  met per verbal report, not doing daily.     Time  8    Period  Weeks    Status  Achieved      PT LONG TERM GOAL #2   Title  Patient will report <= one headache per week with max intensity <=4/10.     Baseline  met per verbal report (unsure of reliability due to poor cognition)    Time  8    Period  Weeks    Status  Achieved      PT LONG TERM GOAL #3   Title  Patient will improve Berg Balance score by 7 points compared to measure at 4 weeks, demonstrating reduced fall risk    Baseline  met (score went from 38/56 to 50/56 on 06/15/18    Time  8    Period  Weeks    Status  Achieved      PT LONG TERM GOAL #4   Title  Patient to improve gait speed to >/= 2.62 ft/sec demonstrating improved functional mobility    Baseline  1.77 ft/sec to 2.82 ft/sec with RW on 06/19/18    Time  8    Period  Weeks    Status  Achieved            Plan - 06/19/18 1237    Clinical Impression Statement  Skilled session focused on assessing remaining LTGs and D/C.  Pt continues to need cues for correct technique with HEP and continue to recommend daughter/granddaughter assisting with this at home.  Also continue to encourage community walking with RW and S from family.  Pt has met remaining goals and is ready for D/C.     Rehab Potential  Good    PT Frequency  2x / week    PT Duration  8  weeks    PT Treatment/Interventions  ADLs/Self Care Home Management;Biofeedback;Cryotherapy;Electrical Stimulation;Gait training;DME Instruction;Ultrasound;Traction;Moist Heat;Stair training;Functional mobility training;Therapeutic activities;Therapeutic exercise;Balance training;Neuromuscular re-education;Cognitive remediation;Manual techniques;Patient/family education;Passive range of motion;Dry needling    Consulted and Agree with Plan of Care  Patient       Patient will benefit from skilled therapeutic intervention in order to improve the following deficits and impairments:  Decreased balance, Decreased cognition, Decreased mobility, Decreased knowledge of use of DME, Decreased range of motion, Decreased safety awareness, Difficulty walking, Increased fascial restricitons, Increased muscle spasms, Impaired flexibility, Postural dysfunction, Impaired vision/preception, Pain  Visit Diagnosis: Unsteadiness on feet  Other abnormalities of gait and mobility  Abnormal posture  Cervicalgia    PHYSICAL THERAPY DISCHARGE SUMMARY  Visits from Start of Care: 9  Current functional level related to goals / functional outcomes: See LTGs above   Remaining deficits: Pt continues to have decreased cervical flexibility and intermittent pain with rotation, however pt has been non-compliant with HEP due to cognitive deficits.  Now has RW to address balance deficits, but has made improvements overall.    Education / Equipment: HEP, RW  Plan: Patient agrees to discharge.  Patient goals were met. Patient is being discharged  due to meeting the stated rehab goals.  ?????       Problem List Patient Active Problem List   Diagnosis Date Noted  . Headache 06/30/2017  . Cervical osteoarthritis 02/17/2017  . Mild cognitive impairment 08/05/2016  . Stress incontinence 06/20/2016  . Calcification of aorta (HCC) 04/15/2016  . At high risk for falls 04/15/2016  . Hypokalemia 03/07/2016  . Health care  maintenance 09/21/2014  . 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 Glaucoma 04/22/2007  . Essential hypertension 04/22/2007   Cameron Sprang, PT, MPT [facility] [address] [place], [place], [address] Phone: [phone]   Fax:  [phone] 06/19/18, 12:42 PM  Name: [name] MRN: [id number] Date of Birth: [birth year]

## 2018-08-09 NOTE — Progress Notes (Signed)
NEUROLOGY FOLLOW UP OFFICE NOTE  Trecia Maring [id number]  HISTORY OF PRESENT ILLNESS: [name] is an 82 year old female who follows up for headache and neck pain.  She is accompanied by her daughter who supplements history.  UPDATE: Since last visit, she has undergone physical therapy for neck pain.  Gabapentin was increased to 100mg  in AM and 400mg  at bedtime. Headaches are better.  However, she reports continued neck pain.  She had one episode of "creepy crawly" and tight sensation on the scalp about a week ago.  It lasted about 1 to 2 hours with ibuprofen.  She may have had 4 attacks over the past month.  HISTORY: Symptoms started in 2018. She describes an aching left sided posterior neck pain that radiates up to the occipital region, causing a moderate to severe throbbing pain with dysesthesias over the scalp.  The neck pain does not radiate down the left arm.  It is constant pain that lasts all day and for several days to weeks and will resolve for a few weeks before recurring.  Any neck movement aggravates it.  Heating pad helps relieve it.  There is no associated jaw claudication, visual disturbance, nausea, vomiting, photophobia, phonophobia, osmophobia, dizziness or unilateral numbness or weakness.  Workup includes (personally reviewed): 08/22/17 MRI BRAIN WO:  mild atrophy, otherwise unremarkable. 08/22/17 MRI CERVICAL SPINE WO:  multilevel degenerative disc disease and spondylosis with spinal and bilateral foraminal stenosis at C3-4, C4-5 and C5-6 10/13/17 LABS:  Sed Rate 12; CRP elevated at 12.9.  PAST MEDICAL HISTORY: Past Medical History:  Diagnosis Date  . 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 Left eye sees shadows and is bothered by bright lights. She wears specialty sunglasses which help her with sensitivity.   [name] CAD (coronary artery  disease)    nonobstructive, myoview normal 03/2007, done by Dr. Daleen Squibb - EF = 75%  . Glaucoma   . HLD (hyperlipidemia)   . Hypertension     MEDICATIONS: Current Outpatient Medications on File Prior to Visit  Medication Sig Dispense Refill  . atenolol (TENORMIN) 25 MG tablet Take 1 tablet (25 mg total) by mouth daily. 30 tablet 0  . bimatoprost (LUMIGAN) 0.03 % ophthalmic drops Place 1 drop into the left eye at bedtime.      . brimonidine (ALPHAGAN P) 0.1 % SOLN 1 drop in left eye twice a day    . cloNIDine (CATAPRES) 0.1 MG tablet Take 1 tablet (0.1 mg total) by mouth 2 (two) times daily. 60 tablet 5  . cyclobenzaprine (FLEXERIL) 5 MG tablet Take 1 tablet (5 mg total) by mouth at bedtime. 30 tablet 3  . donepezil (ARICEPT) 5 MG tablet Take 1 tablet (5 mg total) by mouth at bedtime. 90 tablet 3  . gabapentin (NEURONTIN) 100 MG capsule Take 1 cap in AM and 4 caps QHS x1 wk, then 2 caps in AM and 4 caps QHS x1wk, then 3 caps in AM and 4 caps QHS x1 wk, then 4 caps BID 240 capsule 0  . lisinopril (PRINIVIL,ZESTRIL) 40 MG tablet Take 1 tablet (40 mg total) by mouth daily. 90 tablet 2  . nitroGLYCERIN (NITROSTAT) 0.4 MG SL tablet Place 0.4 mg under the tongue every 5 (five) minutes as needed for chest pain.    . potassium chloride (K-DUR) 10 MEQ tablet Take 1 tablet (10 mEq  total) by mouth daily. 90 tablet 3  . traMADol (ULTRAM) 50 MG tablet Take 1 tablet (50 mg total) by mouth daily as needed for moderate pain. 30 tablet 1   No current facility-administered medications on file prior to visit.     ALLERGIES: Allergies  Allergen Reactions  . Aspirin     REACTION: gastritis with GERD  . Codeine     REACTION: nausea    FAMILY HISTORY: Family History  Problem Relation Age of Onset  . Stroke Neg Hx   . Cancer Neg Hx     SOCIAL HISTORY: Social History   Socioeconomic History  . Marital status: Widowed    Spouse name: Not on file  . Number of children: 2  . Years of education: Not on  file  . Highest education level: 12th grade  Occupational History  . Occupation: retired  Engineer, productionocial Needs  . Financial resource strain: Not on file  . Food insecurity:    Worry: Not on file    Inability: Not on file  . Transportation needs:    Medical: Not on file    Non-medical: Not on file  Tobacco Use  . Smoking status: Former Smoker    Types: Cigarettes  . Smokeless tobacco: Never Used  Substance and Sexual Activity  . Alcohol use: No  . Drug use: No  . Sexual activity: Not on file  Lifestyle  . Physical activity:    Days per week: Not on file    Minutes per session: Not on file  . Stress: Not on file  Relationships  . Social connections:    Talks on phone: Not on file    Gets together: Not on file    Attends religious service: Not on file    Active member of club or organization: Not on file    Attends meetings of clubs or organizations: Not on file    Relationship status: Not on file  . Intimate partner violence:    Fear of current or ex partner: Not on file    Emotionally abused: Not on file    Physically abused: Not on file    Forced sexual activity: Not on file  Other Topics Concern  . Not on file  Social History Narrative   Has a son and a daughter, granddaughter spends a lot of time at her house. Sister and she live together for many years. Sister was living with her mother and Ms. [name] was married but the mother passed away and her husband passed away so they moved in together.      Pt is right handed, widowed, and  just recently moved out of her 2 story house into a 1st floor apartment.  She drinks 2 glasses of green tea daily, and is somewhat active, though is limited by her inability to see. Pt previously ran an adult day care center.     REVIEW OF SYSTEMS: Constitutional: No fevers, chills, or sweats, no generalized fatigue, change in appetite Eyes: No visual changes, double vision, eye pain Ear, nose and throat: No hearing loss, ear pain, nasal  congestion, sore throat Cardiovascular: No chest pain, palpitations Respiratory:  No shortness of breath at rest or with exertion, wheezes GastrointestinaI: No nausea, vomiting, diarrhea, abdominal pain, fecal incontinence Genitourinary:  No dysuria, urinary retention or frequency Musculoskeletal: Neck pain Integumentary: No rash, pruritus, skin lesions Neurological: as above Psychiatric: No depression, insomnia, anxiety Endocrine: No palpitations, fatigue, diaphoresis, mood swings, change in appetite, change in weight, increased thirst  Hematologic/Lymphatic:  No purpura, petechiae. Allergic/Immunologic: no itchy/runny eyes, nasal congestion, recent allergic reactions, rashes  PHYSICAL EXAM: Blood pressure (!) 146/82, pulse 72, height 5\' 5"  (1.651 m), weight 152 lb (68.9 kg), SpO2 96 %. General: No acute distress.  Patient appears well-groomed.   Head:  Normocephalic/atraumatic Eyes:  Fundi examined but not visualized Neck: supple, left paraspinal tenderness, full range of motion Heart:  Regular rate and rhythm Lungs:  Clear to auscultation bilaterally Back: No paraspinal tenderness Neurological Exam: alert and oriented to person, place, and time. Attention span and concentration intact, recent and remote memory intact, fund of knowledge intact.  Speech fluent and not dysarthric, language intact.  Prosthetic right eye.  Irregular non-reactive surgical left pupil.  Decreased vision in left eye.  Otherwise, CN II-XII intact. Bulk and tone normal, muscle strength 5/5 throughout.  Sensation to light touch intact.  Deep tendon reflexes 2+ throughout.  Finger to nose testing intact.  Gait normal, Romberg negative.  IMPRESSION: Cervicogenic headache and neuralgia of the scalp Degenerative disc disease of cervical spine  PLAN: 1.  Increase gabapentin to 200 mg in the morning and 400 mg at bedtime.  We can increase dose to 200 mg in the morning and 400 mg at bedtime in 4 weeks if needed. 2.   Follow-up in 4 months.  Shon Millet, DO  CC: Erlinda Hong, MD

## 2018-08-10 ENCOUNTER — Ambulatory Visit (INDEPENDENT_AMBULATORY_CARE_PROVIDER_SITE_OTHER): Payer: Medicare Other | Admitting: Neurology

## 2018-08-10 ENCOUNTER — Encounter: Payer: Self-pay | Admitting: Neurology

## 2018-08-10 VITALS — BP 146/82 | HR 72 | Ht 65.0 in | Wt 152.0 lb

## 2018-08-10 DIAGNOSIS — M5030 Other cervical disc degeneration, unspecified cervical region: Secondary | ICD-10-CM

## 2018-08-10 DIAGNOSIS — M792 Neuralgia and neuritis, unspecified: Secondary | ICD-10-CM | POA: Diagnosis not present

## 2018-08-10 DIAGNOSIS — R51 Headache: Secondary | ICD-10-CM | POA: Diagnosis not present

## 2018-08-10 DIAGNOSIS — G4486 Cervicogenic headache: Secondary | ICD-10-CM

## 2018-08-10 MED ORDER — GABAPENTIN 100 MG PO CAPS: ORAL_CAPSULE | ORAL | 3 refills | Status: DC

## 2018-08-10 NOTE — Patient Instructions (Signed)
1.  Increase gabapentin to 2 pills in morning and 4 pills at bedtime.  If headache still frequent in 4 weeks, contact me and we can increase dose 2.  Follow up in 4 months.

## 2018-08-17 ENCOUNTER — Other Ambulatory Visit: Payer: Self-pay | Admitting: *Deleted

## 2018-08-17 MED ORDER — CLONIDINE HCL 0.1 MG PO TABS: 0.1000 mg | ORAL_TABLET | Freq: Two times a day (BID) | ORAL | 3 refills | Status: DC

## 2018-09-17 ENCOUNTER — Encounter: Payer: Self-pay | Admitting: *Deleted

## 2018-09-21 ENCOUNTER — Encounter: Payer: Medicare Other | Admitting: Student in an Organized Health Care Education/Training Program

## 2018-09-21 ENCOUNTER — Other Ambulatory Visit: Payer: Self-pay

## 2018-09-21 ENCOUNTER — Ambulatory Visit (INDEPENDENT_AMBULATORY_CARE_PROVIDER_SITE_OTHER): Payer: Medicare Other | Admitting: Student in an Organized Health Care Education/Training Program

## 2018-09-21 VITALS — BP 180/86 | HR 71 | Temp 97.6°F | Ht 65.0 in | Wt 156.0 lb

## 2018-09-21 DIAGNOSIS — Z23 Encounter for immunization: Secondary | ICD-10-CM | POA: Diagnosis not present

## 2018-09-21 DIAGNOSIS — Z9114 Patient's other noncompliance with medication regimen: Secondary | ICD-10-CM

## 2018-09-21 DIAGNOSIS — I10 Essential (primary) hypertension: Secondary | ICD-10-CM | POA: Diagnosis not present

## 2018-09-21 DIAGNOSIS — R51 Headache: Secondary | ICD-10-CM | POA: Diagnosis not present

## 2018-09-21 DIAGNOSIS — J069 Acute upper respiratory infection, unspecified: Secondary | ICD-10-CM | POA: Diagnosis not present

## 2018-09-21 DIAGNOSIS — R519 Headache, unspecified: Secondary | ICD-10-CM

## 2018-09-21 DIAGNOSIS — Z87891 Personal history of nicotine dependence: Secondary | ICD-10-CM | POA: Diagnosis not present

## 2018-09-21 DIAGNOSIS — Z79899 Other long term (current) drug therapy: Secondary | ICD-10-CM

## 2018-09-21 DIAGNOSIS — R011 Cardiac murmur, unspecified: Secondary | ICD-10-CM

## 2018-09-21 NOTE — Assessment & Plan Note (Signed)
Multifactorial headache, mostly cervicogenic given our prior work-up.  Today I think her headache is probably worsened by her upper respiratory tract infection and hypertension.  We are addressing those 2 as below.  Continue with gabapentin 100 mg as needed at night.

## 2018-09-21 NOTE — Assessment & Plan Note (Signed)
Symptoms today consistent with 2 weeks of upper respiratory tract infection type symptoms.  No signs of sinusitis.  Exam is reassuring with no signs of pneumonia.  No role for antibiotics.  Discussed supportive care.  Low risk for influenza.

## 2018-09-21 NOTE — Progress Notes (Signed)
   Assessment and Plan:  See Encounters tab for problem-based medical decision making.   __________________________________________________________  HPI:   83 year old woman here for acute visit with cough and a cold.  Reports being sick for about 2 weeks.  Says that it is getting a little bit better.  Cough is dry, nonproductive.  Denies fevers, denies hemoptysis.  Eating and drinking well.  No other high risk features.  Reports poor recent compliance with her antihypertensives.  Did not take blood pressure medicines yesterday, took some weight today.  Reports return of her headache, this is been an intermittent issue that we have been working through for the last 1 year.  Currently not severe.  Not function limiting.  Has her blood pressure medicines with her, just does not remember to take them.  Accompanied by her daughter today.  Usually doing well at home.  Had transportation issues and this are continuity clinic visits this morning.  __________________________________________________________  Problem List: Patient Active Problem List   Diagnosis Date Noted  . Headache 06/30/2017    Priority: High  . At high risk for falls 04/15/2016    Priority: High  . Cervical osteoarthritis 02/17/2017    Priority: Medium  . Mild cognitive impairment 08/05/2016    Priority: Medium  . Stress incontinence 06/20/2016    Priority: Medium  . Calcification of aorta (HCC) 04/15/2016    Priority: Medium  . Glaucoma 04/22/2007    Priority: Medium  . Essential hypertension 04/22/2007    Priority: Medium  . Hypokalemia 03/07/2016    Priority: Low  . Health care maintenance 09/21/2014    Priority: Low  . 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 URI (upper respiratory infection) 09/21/2018    Medications: Reconciled today in Epic __________________________________________________________  Physical Exam:  Vital Signs: Vitals:   09/21/18 1533   BP: (!) 180/86  Pulse: 71  Temp: 97.6 F (36.4 C)  TempSrc: Oral  SpO2: 98%  Weight: 156 lb (70.8 kg)  Height: 5\' 5"  (1.651 m)    Gen: Well appearing, NAD Neck: No cervical LAD, No thyromegaly or nodules, No JVD. CV: RRR, 2 out of 6 early systolic murmur at the left upper sternal border Pulm: Normal effort, CTA throughout, no wheezing Ext: Warm, no edema, normal joints

## 2018-09-21 NOTE — Assessment & Plan Note (Signed)
Severe chronic asymptomatic hypertension worsened by poor compliance with antihypertensives including clonidine.  We talked about the importance of better compliance especially given risk of rebound hypertension.  Patient understands.  Has her blood pressure medicines with her.  Continue with clonidine 0.1 mg twice daily, atenolol 25 mg daily, lisinopril 40 mg daily.

## 2018-09-21 NOTE — Patient Instructions (Signed)
I think you have a viral upper respiratory tract infection which will get better with rest and time.   Your blood pressure is high which may also be making your headache worse.  Please make sure to take your blood pressure medicines on time and consistently to avoid these very high blood pressure readings.

## 2018-10-26 ENCOUNTER — Other Ambulatory Visit: Payer: Self-pay

## 2018-10-26 DIAGNOSIS — I10 Essential (primary) hypertension: Secondary | ICD-10-CM

## 2018-10-26 MED ORDER — LISINOPRIL 40 MG PO TABS: 40.0000 mg | ORAL_TABLET | Freq: Every day | ORAL | 2 refills | Status: DC

## 2018-10-26 MED ORDER — CYCLOBENZAPRINE HCL 5 MG PO TABS: 5.0000 mg | ORAL_TABLET | Freq: Every day | ORAL | 3 refills | Status: DC

## 2018-10-26 MED ORDER — ATENOLOL 25 MG PO TABS: 25.0000 mg | ORAL_TABLET | Freq: Every day | ORAL | 3 refills | Status: DC

## 2018-10-26 NOTE — Telephone Encounter (Signed)
atenolol (TENORMIN) 25 MG tablet  lisinopril (PRINIVIL,ZESTRIL) 40 MG tablet,  cyclobenzaprine (FLEXERIL) 5 MG tablet   Refill request @  [id number] [place], [place] - [address] AT [facility] OF [facility] & [address] [phone] (Phone) [phone] (Fax)

## 2018-11-03 ENCOUNTER — Encounter: Payer: Self-pay | Admitting: Internal Medicine

## 2018-11-03 ENCOUNTER — Ambulatory Visit (INDEPENDENT_AMBULATORY_CARE_PROVIDER_SITE_OTHER): Payer: Medicare Other | Admitting: Internal Medicine

## 2018-11-03 ENCOUNTER — Other Ambulatory Visit: Payer: Self-pay

## 2018-11-03 VITALS — BP 147/65 | HR 67 | Temp 97.6°F | Ht 65.0 in | Wt 155.9 lb

## 2018-11-03 DIAGNOSIS — H5461 Unqualified visual loss, right eye, normal vision left eye: Secondary | ICD-10-CM

## 2018-11-03 DIAGNOSIS — R350 Frequency of micturition: Secondary | ICD-10-CM | POA: Diagnosis not present

## 2018-11-03 DIAGNOSIS — Z87891 Personal history of nicotine dependence: Secondary | ICD-10-CM

## 2018-11-03 DIAGNOSIS — R319 Hematuria, unspecified: Secondary | ICD-10-CM | POA: Diagnosis not present

## 2018-11-03 DIAGNOSIS — H5450 Low vision, one eye, unspecified eye: Principal | ICD-10-CM

## 2018-11-03 DIAGNOSIS — Z970 Presence of artificial eye: Secondary | ICD-10-CM | POA: Diagnosis not present

## 2018-11-03 DIAGNOSIS — N390 Urinary tract infection, site not specified: Secondary | ICD-10-CM

## 2018-11-03 DIAGNOSIS — H5440 Blindness, one eye, unspecified eye: Secondary | ICD-10-CM

## 2018-11-03 LAB — POCT URINALYSIS DIPSTICK
BILIRUBIN UA: NEGATIVE
GLUCOSE UA: NEGATIVE
KETONES UA: NEGATIVE
NITRITE UA: NEGATIVE
Protein, UA: NEGATIVE
Spec Grav, UA: 1.015 (ref 1.010–1.025)
Urobilinogen, UA: 0.2 E.U./dL
pH, UA: 7 (ref 5.0–8.0)

## 2018-11-03 MED ORDER — NITROFURANTOIN MONOHYD MACRO 100 MG PO CAPS: 100.0000 mg | ORAL_CAPSULE | Freq: Two times a day (BID) | ORAL | 0 refills | Status: AC

## 2018-11-03 NOTE — Assessment & Plan Note (Addendum)
History of present illness: Patient states it has been over 5 years since she seen an ophthalmologist for her right glass eye and history of glaucoma.  She would like a referral today for her eye to be cleaned.  Assessment: right eye prothesis secondary to glaucoma No acute issues.  Will refer to opthalmology  Plan opthalmology referral

## 2018-11-03 NOTE — Addendum Note (Signed)
Addended by: Geralyn Corwin R on: 11/03/2018 11:03 AM   Modules accepted: Orders

## 2018-11-03 NOTE — Progress Notes (Signed)
   CC: eye referral and urinary frequency  HPI:  [name] is a 83 y.o. female with history noted below that presents to the acute care clinic for eye referral and urinary frequency. Please see problem based charting for the status of patient's medical conditions.  Past Medical History:  Diagnosis Date  . 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 Left eye sees shadows and is bothered by bright lights. She wears specialty sunglasses which help her with sensitivity.   [name] CAD (coronary artery disease)    nonobstructive, myoview normal 03/2007, done by Dr. Daleen Squibb - EF = 75%  . Glaucoma   . HLD (hyperlipidemia)   . Hypertension     Review of Systems:  Review of Systems  Constitutional: Negative for chills and fever.  Eyes: Negative for blurred vision.     Physical Exam:  Vitals:   11/03/18 0957 11/03/18 1025  BP: (!) 157/82 (!) 147/65  Pulse: 86 67  Temp: 97.6 F (36.4 C)   TempSrc: Oral   Weight: 155 lb 14.4 oz (70.7 kg)    Physical Exam  Constitutional: She is well-developed, well-nourished, and in no distress.  Eyes: Conjunctivae are normal. Right eye exhibits no discharge. Left eye exhibits no discharge.  Cardiovascular: Normal rate, regular rhythm and normal heart sounds. Exam reveals no gallop and no friction rub.  No murmur heard. Pulmonary/Chest: Effort normal and breath sounds normal. No respiratory distress. She has no wheezes. She has no rales.     Assessment & Plan:   See encounters tab for problem based medical decision making.   Patient discussed with Dr. Josem Kaufmann

## 2018-11-03 NOTE — Assessment & Plan Note (Addendum)
History of present illness: Patient reports a 2 week history of urinary frequency and this is a new symptom. She denies dysuria or fever/chills.  Assessment: Urinary frequency Dipstick in office showed negative nitrites, trace leukocytes and trace blood.  Will treat for symptomatic UTI  Plan -macrobid

## 2018-11-03 NOTE — Progress Notes (Signed)
Case discussed with Dr. Ratliff-Hoffman at the time of the visit.  We reviewed the resident's history and exam and pertinent patient test results.  I agree with the assessment, diagnosis and plan of care documented in the resident's note. 

## 2018-11-03 NOTE — Patient Instructions (Signed)
Ms. Roccia,  It is a pleasure meeting you today. Please start taking her antibiotics for your urinary tract infection. Also a referral has been made to an eye doctor.  Please call the clinic if you have any questions or concerns.

## 2018-11-17 ENCOUNTER — Ambulatory Visit: Payer: Medicare Other

## 2018-12-30 ENCOUNTER — Telehealth: Payer: Self-pay | Admitting: Student in an Organized Health Care Education/Training Program

## 2018-12-30 ENCOUNTER — Telehealth: Payer: Self-pay | Admitting: *Deleted

## 2018-12-30 NOTE — Telephone Encounter (Signed)
Return pt's call - talked to pt's daughter, Meriam Sprague, stated pt has an artificial eye and needs to be clean. A referral has been made but she stated no one is seeing pts right now d/t covid. I told she may have to call around to see if anyone seeing pt now; she became upset, stated we are her mother's doctor and we need to call. I told her I will give this to [facility], referral coordinator, to f/u.

## 2018-12-30 NOTE — Telephone Encounter (Signed)
SPOKE WITH PATIENT REGARDING HER EYE REFERRAL. PER PATIENT SHE STATES SHE SPOKE WITH OFFICE AND THEY ([facility]) ARE NOT SEEING PATIENT AT THE MOMENT DUE TO COVID 19. ADVISED PATIENT TO CONTINUE TO CALL OFFICE OR HAVE THEM TO CONTACT HER  WHEN THEY START SCHEDULING.

## 2018-12-30 NOTE — Telephone Encounter (Signed)
See [name] telephone encounter on 12/30/18.

## 2018-12-30 NOTE — Telephone Encounter (Signed)
Pls contact pt [phone]

## 2019-02-05 DIAGNOSIS — Z961 Presence of intraocular lens: Secondary | ICD-10-CM | POA: Diagnosis not present

## 2019-02-05 DIAGNOSIS — Z970 Presence of artificial eye: Secondary | ICD-10-CM | POA: Diagnosis not present

## 2019-02-05 DIAGNOSIS — H18412 Arcus senilis, left eye: Secondary | ICD-10-CM | POA: Diagnosis not present

## 2019-02-05 DIAGNOSIS — H401123 Primary open-angle glaucoma, left eye, severe stage: Secondary | ICD-10-CM | POA: Diagnosis not present

## 2019-04-26 ENCOUNTER — Other Ambulatory Visit: Payer: Self-pay | Admitting: *Deleted

## 2019-04-28 MED ORDER — POTASSIUM CHLORIDE ER 10 MEQ PO TBCR: 10.0000 meq | EXTENDED_RELEASE_TABLET | Freq: Every day | ORAL | 0 refills | Status: DC

## 2019-04-29 ENCOUNTER — Other Ambulatory Visit: Payer: Self-pay

## 2019-04-29 ENCOUNTER — Ambulatory Visit (INDEPENDENT_AMBULATORY_CARE_PROVIDER_SITE_OTHER): Payer: Medicare Other | Admitting: Internal Medicine

## 2019-04-29 DIAGNOSIS — R6889 Other general symptoms and signs: Secondary | ICD-10-CM | POA: Diagnosis not present

## 2019-04-29 DIAGNOSIS — J069 Acute upper respiratory infection, unspecified: Secondary | ICD-10-CM | POA: Diagnosis not present

## 2019-04-29 DIAGNOSIS — Z20822 Contact with and (suspected) exposure to covid-19: Secondary | ICD-10-CM

## 2019-04-29 MED ORDER — ALBUTEROL SULFATE HFA 108 (90 BASE) MCG/ACT IN AERS: 1.0000 | INHALATION_SPRAY | Freq: Four times a day (QID) | RESPIRATORY_TRACT | 1 refills | Status: DC | PRN

## 2019-04-29 NOTE — Progress Notes (Signed)
  [facility] Internal Medicine Residency Telephone Encounter Continuity Care Appointment  HPI:   This telephone encounter was created for [name] on 04/29/2019 for the following purpose/cc cough .   Past Medical History:  Past Medical History:  Diagnosis Date  . 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 Left eye sees shadows and is bothered by bright lights. She wears specialty sunglasses which help her with sensitivity.   [name] CAD (coronary artery disease)    nonobstructive, myoview normal 03/2007, done by Dr. Verl Blalock - EF = 75%  . Glaucoma   . HLD (hyperlipidemia)   . Hypertension       ROS:   As above   Assessment / Plan / Recommendations:   Please see A&P under problem oriented charting for assessment of the patient's acute and chronic medical conditions.   As always, pt is advised that if symptoms worsen or new symptoms arise, they should go to an urgent care facility or to to ER for further evaluation.   Consent and Medical Decision Making:   Patient discussed with Dr. Rebeca Alert  This is a telephone encounter between [name] and [name] on 04/29/2019 for cough. The visit was conducted with the patient located at home and Jeferson Boozer at [facility]. The patient's identity was confirmed using their DOB and current address. The patient along with patient's daughter has consented to being evaluated through a telephone encounter and understands the associated risks (an examination cannot be done and the patient may need to come in for an appointment) / benefits (allows the patient to remain at home, decreasing exposure to coronavirus). I personally spent 15 minutes on medical discussion.

## 2019-04-29 NOTE — Progress Notes (Signed)
Internal Medicine Clinic Attending  Case discussed with Dr. Chundi at the time of the visit.  We reviewed the resident's history and exam and pertinent patient test results.  I agree with the assessment, diagnosis, and plan of care documented in the resident's note.  Alexander Raines, M.D., Ph.D.  

## 2019-04-29 NOTE — Assessment & Plan Note (Signed)
Patient has been having a cough for the past 1.5 weeks that is dry in nature that is worsened at night. With accompanied sneezing, headaches, and chest pain with cough. Temperature has been within normal limits. Without any accompanied fevers, chills, myalgias, runny nose, shortness of breath. Patient has been taking tylenol for headaches which helps somewhat. Has not used anything to suppress cough.   Patient and her family has been practicing social distancing and have been wearing masks.   Assessment and plan  Patient with dry cough and chest pain for past 1.5weeks without any pre-existing lung conditions. Patient likely has a viral upper respiratory infection. Recommended patient to use mucinex dm, prescribed albuterol inhaler for bronchospasms prn, continue tylenol. I have also ordered a [phone] test as patient does have symptoms.

## 2019-04-30 ENCOUNTER — Telehealth: Payer: Self-pay | Admitting: *Deleted

## 2019-04-30 LAB — NOVEL CORONAVIRUS, NAA: SARS-CoV-2, NAA: NOT DETECTED

## 2019-04-30 NOTE — Telephone Encounter (Signed)
Call from pt - stated she forgot what the doctor told told her to take for cough. SHe had a telehealth visit w/ Dr Maricela Bo. Pt informed Mucinex DM was suggested.    "Recommended patient to use mucinex dm" per Dr Maricela Bo

## 2019-05-21 DIAGNOSIS — Q111 Other anophthalmos: Secondary | ICD-10-CM | POA: Diagnosis not present

## 2019-05-24 ENCOUNTER — Ambulatory Visit (INDEPENDENT_AMBULATORY_CARE_PROVIDER_SITE_OTHER): Payer: Medicare Other | Admitting: Student in an Organized Health Care Education/Training Program

## 2019-05-24 ENCOUNTER — Encounter: Payer: Self-pay | Admitting: Student in an Organized Health Care Education/Training Program

## 2019-05-24 ENCOUNTER — Other Ambulatory Visit: Payer: Self-pay

## 2019-05-24 VITALS — BP 128/68 | HR 82 | Temp 99.3°F | Ht 65.0 in | Wt 150.1 lb

## 2019-05-24 DIAGNOSIS — S0571XS Avulsion of right eye, sequela: Secondary | ICD-10-CM

## 2019-05-24 DIAGNOSIS — Z87891 Personal history of nicotine dependence: Secondary | ICD-10-CM

## 2019-05-24 DIAGNOSIS — E2839 Other primary ovarian failure: Secondary | ICD-10-CM | POA: Diagnosis not present

## 2019-05-24 DIAGNOSIS — Z79899 Other long term (current) drug therapy: Secondary | ICD-10-CM

## 2019-05-24 DIAGNOSIS — G3184 Mild cognitive impairment, so stated: Secondary | ICD-10-CM | POA: Diagnosis not present

## 2019-05-24 DIAGNOSIS — Z970 Presence of artificial eye: Secondary | ICD-10-CM | POA: Diagnosis not present

## 2019-05-24 DIAGNOSIS — Z9181 History of falling: Secondary | ICD-10-CM

## 2019-05-24 DIAGNOSIS — X58XXXS Exposure to other specified factors, sequela: Secondary | ICD-10-CM | POA: Diagnosis not present

## 2019-05-24 DIAGNOSIS — H409 Unspecified glaucoma: Secondary | ICD-10-CM | POA: Diagnosis not present

## 2019-05-24 DIAGNOSIS — I10 Essential (primary) hypertension: Secondary | ICD-10-CM

## 2019-05-24 NOTE — Progress Notes (Signed)
   Assessment and Plan:  See Encounters tab for problem-based medical decision making.   __________________________________________________________  HPI:   83 year old woman here for follow-up of hypertension.  Reports doing well at home.  Reports good functional status, exercises with walking on occasion.  Denies chest pain or shortness of breath.  No recent fevers or chills.  Independent in activities of daily living at home.  Accompanied by family today.  She has decreased vision due to enucleated right eye, glaucoma in the left.  Uses a white and red walking cane.  Also uses a rolling walker and a gray walker at home.  No falls at home but has had times of imbalance.  Has a dog at home.  Reports okay compliance with medicines, uses a pillbox, has not used atenolol in several weeks due to running out.  Did not take any medications over the weekend, says she forgot.   __________________________________________________________  Problem List: Patient Active Problem List   Diagnosis Date Noted  . Headache 06/30/2017    Priority: High  . At high risk for falls 04/15/2016    Priority: High  . Cervical osteoarthritis 02/17/2017    Priority: Medium  . Mild cognitive impairment 08/05/2016    Priority: Medium  . Stress incontinence 06/20/2016    Priority: Medium  . Calcification of aorta (HCC) 04/15/2016    Priority: Medium  . Glaucoma 04/22/2007    Priority: Medium  . Essential hypertension 04/22/2007    Priority: Medium  . Hypokalemia 03/07/2016    Priority: Low  . Health care maintenance 09/21/2014    Priority: Low  . 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 Estrogen deficiency 05/24/2019    Medications: Reconciled today in Epic __________________________________________________________  Physical Exam:  Vital Signs: Vitals:   05/24/19 0923  BP: (!) 167/66  Pulse: 84  Temp: 99.3 F (37.4 C)  TempSrc: Oral  SpO2: 98%   Weight: 150 lb 1.6 oz (68.1 kg)    Gen: Well appearing, NAD Eye: right eye is a prosthetic, left eye is normal, she wears sunglasses for glaucoma CV: RRR, no murmurs Pulm: Normal effort, CTA throughout, no wheezing Abd: Soft, NT, ND Ext: Warm, no edema, normal joints

## 2019-05-24 NOTE — Patient Instructions (Signed)
It was great seeing you today.  We talked about your imbalance and risk of falling.  I have ordered a bone density scan as well as a referral to a physical therapy center to work on gait training and improving her balance.  For your blood pressure, it is okay to stop taking atenolol.  Please take all your other medications as they are listed on this paper.  We will check blood work today to look at your kidneys, potassium, and cholesterol.

## 2019-05-24 NOTE — Assessment & Plan Note (Signed)
Given advanced age and decreased vision, patient is at increased risk for falls.  Having some episodes of imbalance.  She has 2 walkers that she uses at home.  Also uses a vision cane.  Plan is referral to physical therapy for gait training.  Also going to order a DEXA screen, last done over 10 years ago, to rule out osteoporosis.

## 2019-05-24 NOTE — Assessment & Plan Note (Signed)
Memory is stable, functional status at home is stable.  Has good support from family.  Continue with donepezil 5 mg daily.  We talked about reasonable expectations from this medication.

## 2019-05-24 NOTE — Assessment & Plan Note (Signed)
Last DEXA scan was 2009.  Normal then.  Plan to repeat DEXA now to rule out new osteoporosis.

## 2019-05-24 NOTE — Assessment & Plan Note (Addendum)
Blood pressure initially elevated, better on recheck.  Continues to struggle with consistency in taking her medications every day. Will try to decrease pill burden, discontinue atenolol.  This was 25 mg daily, I think it was a historic medication.  She has had no history of ischemic vascular events or heart failure, so I do not think there is an independent indication for beta-blocker right now.  Continue with lisinopril 40 mg daily and clonidine 0.1 mg twice daily.  Patient is intolerant to diuretics due to urinary incontinence.  We discontinued amlodipine last year due to symptomatic hypotension.

## 2019-05-25 ENCOUNTER — Telehealth: Payer: Self-pay | Admitting: Student in an Organized Health Care Education/Training Program

## 2019-05-25 ENCOUNTER — Encounter: Payer: Self-pay | Admitting: Student in an Organized Health Care Education/Training Program

## 2019-05-25 DIAGNOSIS — E7800 Pure hypercholesterolemia, unspecified: Secondary | ICD-10-CM

## 2019-05-25 DIAGNOSIS — E785 Hyperlipidemia, unspecified: Secondary | ICD-10-CM | POA: Insufficient documentation

## 2019-05-25 LAB — BMP8+ANION GAP
Anion Gap: 15 mmol/L (ref 10.0–18.0)
BUN/Creatinine Ratio: 11 — ABNORMAL LOW (ref 12–28)
BUN: 10 mg/dL (ref 8–27)
CO2: 22 mmol/L (ref 20–29)
Calcium: 9.5 mg/dL (ref 8.7–10.3)
Chloride: 104 mmol/L (ref 96–106)
Creatinine, Ser: 0.95 mg/dL (ref 0.57–1.00)
GFR calc Af Amer: 65 mL/min/{1.73_m2} (ref >59)
GFR calc non Af Amer: 56 mL/min/{1.73_m2} — ABNORMAL LOW (ref >59)
Glucose: 98 mg/dL (ref 65–99)
Potassium: 4.1 mmol/L (ref 3.5–5.2)
Sodium: 141 mmol/L (ref 134–144)

## 2019-05-25 LAB — LIPID PANEL
Chol/HDL Ratio: 3.8 ratio (ref 0.0–4.4)
Cholesterol, Total: 258 mg/dL — ABNORMAL HIGH (ref 100–199)
HDL: 68 mg/dL (ref >39)
LDL Chol Calc (NIH): 178 mg/dL — ABNORMAL HIGH (ref 0–99)
Triglycerides: 74 mg/dL (ref 0–149)
VLDL Cholesterol Cal: 12 mg/dL (ref 5–40)

## 2019-05-25 MED ORDER — PRAVASTATIN SODIUM 20 MG PO TABS: 20.0000 mg | ORAL_TABLET | Freq: Every day | ORAL | 3 refills | Status: DC

## 2019-05-25 NOTE — Telephone Encounter (Signed)
Called and discussed lab work from recent visit. She has a significant hyperlipidemia. We discussed options of doing nothing versus statin for primary prevention of ischemic event. She has opted for treatment. We will try pravastatin 20mg  daily and see how she tolerates.

## 2019-05-25 NOTE — Assessment & Plan Note (Signed)
Elevated total cholesterol and LDL to 178. No history of MI or CVA. We talked about goal of using statin to lower chance of ischemic event. She wants to reduce risk of CVA, which seems reasonable even at her advanced age. I doubt we will get much mortality benefit, but there is a QOL benefit to avoiding a CVA. Plan is to start pravastatin 20mg  daily and see how she tolerates. This is primary prevention of ischemic vascular disease.

## 2019-06-16 ENCOUNTER — Telehealth: Payer: Self-pay | Admitting: Physical Therapy

## 2019-06-16 ENCOUNTER — Encounter: Payer: Self-pay | Admitting: Physical Therapy

## 2019-06-16 ENCOUNTER — Other Ambulatory Visit: Payer: Self-pay

## 2019-06-16 ENCOUNTER — Ambulatory Visit
Payer: Medicare Other | Attending: Student in an Organized Health Care Education/Training Program | Admitting: Physical Therapy

## 2019-06-16 DIAGNOSIS — R293 Abnormal posture: Secondary | ICD-10-CM | POA: Insufficient documentation

## 2019-06-16 DIAGNOSIS — R2681 Unsteadiness on feet: Secondary | ICD-10-CM | POA: Diagnosis not present

## 2019-06-16 DIAGNOSIS — R2689 Other abnormalities of gait and mobility: Secondary | ICD-10-CM

## 2019-06-16 NOTE — Patient Instructions (Signed)
Access Code: [email or web address]  URL: [email or web address]  Date: 06/16/2019  Prepared by: Elsie Ra   Exercises  Side Stepping with Counter Support - 3-5 reps - 1 sets - 2x daily - 6x weekly  Standing Tandem Balance with Counter Support - 3 reps - 1 sets - 30 hold - 2x daily - 6x weekly  Standing Single Leg Stance with Counter Support - 10 reps - 3 sets - 2x daily - 6x weekly  Sit to Stand without Arm Support - 10 reps - 1-2 sets - 2x daily - 6x weekly

## 2019-06-16 NOTE — Therapy (Signed)
[facility] [address] [place], [place], [address] Phone: [phone]   Fax:  [phone]  Physical Therapy Evaluation  Patient Details  Name: [name] MRN: [id number] Date of Birth: 1936/03/24 Referring Provider (PT): Tyson Alias, MD   Encounter Date: 06/16/2019  PT End of Session - 06/16/19 0939    Visit Number  1    Number of Visits  12    Date for PT Re-Evaluation  07/28/19   will try to get HHPT set up, but if not will continue to see in clinic   Authorization Type  MCR/tricare    PT Start Time  0850    PT Stop Time  0930    PT Time Calculation (min)  40 min    Activity Tolerance  Patient tolerated treatment well    Behavior During Therapy  [facility] for tasks assessed/performed       Past Medical History:  Diagnosis Date  . 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 Left eye sees shadows and is bothered by bright lights. She wears specialty sunglasses which help her with sensitivity.   [name] CAD (coronary artery disease)    nonobstructive, myoview normal 03/2007, done by Dr. Daleen Squibb - EF = 75%  . Glaucoma   . HLD (hyperlipidemia)   . Hypertension     History reviewed. No pertinent surgical history.  There were no vitals filed for this visit.   Subjective Assessment - 06/16/19 0854    Subjective  She has decreased vision due to enucleated right eye, glaucoma in the left.  She says she can see straight out but not in her periphery, like looking through a straw. Uses a walking guide cane.  Also uses a rolling walker and a gray walker at home.  No falls at home but has had times of imbalance and is using furniture to hold onto when walking in the house. She denies any dizziness. She does have complaint of pressure building up in her head so was encouraged to try to lower BP and to  follow up with MD if this continues.    Pertinent History  Rt eye blind, CAD, HTN    Limitations  Walking;House hold activities    How long can you stand comfortably?  7 min    How long can you walk comfortably?  household distances    Patient Stated Goals  improve balance    Currently in Pain?  Yes    Pain Score  --   overall joint pain 3/10        [facility] PT Assessment - 06/16/19 0001      Assessment   Medical Diagnosis  gait/balance, poor vision    Referring Provider (PT)  Oswaldo Done Marquita Palms, MD    Onset Date/Surgical Date  --   chronic issue   Next MD Visit  to follow up after PT    Prior Therapy  PT last year for balance      Precautions   Precautions  Fall      Balance Screen   Has the patient fallen in the past 6 months  No   but close calls     Home Environment   Living Environment  Private residence    Living Arrangements  Other relatives   granddaughter     Prior Function   Level of  Independence  Independent with basic ADLs      Cognition   Overall Cognitive Status  Within Functional Limits for tasks assessed      Sensation   Light Touch  Appears Intact      Coordination   Gross Motor Movements are Fluid and Coordinated  Yes      Tone   Assessment Location  --   WFL     ROM / Strength   AROM / PROM / Strength  Strength      Strength   Overall Strength Comments  4+/5 strength UE/LE grossly tested in sitting      Transfers   Transfers  Independent with all Transfers      Ambulation/Gait   Gait Comments  uses walking guide stick to scan floor in peripheral due to Right eye blindness and poor vision in her Lt eye      Standardized Balance Assessment   Standardized Balance Assessment  Berg Balance Test;Five Times Sit to Stand    Five times sit to stand comments   24 sec no hands from standard chair      Berg Balance Test   Sit to Stand  Able to stand without using hands and stabilize independently    Standing Unsupported  Able to stand  safely 2 minutes    Sitting with Back Unsupported but Feet Supported on Floor or Stool  Able to sit safely and securely 2 minutes    Stand to Sit  Sits safely with minimal use of hands    Transfers  Able to transfer safely, minor use of hands    Standing Unsupported with Eyes Closed  Able to stand 10 seconds with supervision    Standing Unsupported with Feet Together  Able to place feet together independently and stand for 1 minute with supervision    From Standing, Reach Forward with Outstretched Arm  Can reach forward >12 cm safely (5")    From Standing Position, Pick up Object from Floor  Able to pick up shoe safely and easily    From Standing Position, Turn to Look Behind Over each Shoulder  Looks behind from both sides and weight shifts well    Turn 360 Degrees  Needs close supervision or verbal cueing    Standing Unsupported, Alternately Place Feet on Step/Stool  Able to complete 4 steps without aid or supervision    Standing Unsupported, One Foot in Front  Needs help to step but can hold 15 seconds    Standing on One Leg  Tries to lift leg/unable to hold 3 seconds but remains standing independently    Total Score  42                Objective measurements completed on examination: See above findings.              PT Education - 06/16/19 9604    Education Details  HEP, POC, will try to set up HHPT.    Person(s) Educated  Patient    Methods  Explanation;Demonstration;Verbal cues;Handout    Comprehension  Verbalized understanding;Need further instruction          PT Long Term Goals - 06/16/19 0948      PT LONG TERM GOAL #1   Title  Patient will perform updated/finalized HEP (with family assist) to address weakness and decreased balance (Target 07/28/19)    Baseline  not doing any HEP    Time  6    Period  Weeks  Status  New      PT LONG TERM GOAL #2   Title  Pt will improve 5TSTS test to less than 14 sec to show improved leg strength and balance.     Baseline  24    Time  6    Period  Weeks    Status  New      PT LONG TERM GOAL #3   Title  Patient will improve Berg Balance score by 8 points to >51 demonstrating reduced fall risk    Baseline  42 today, did get up to 50 last year    Time  6    Period  Weeks    Status  New             Plan - 06/16/19 0940    Clinical Impression Statement  Pt presents with decreased balance and unsteady gait and is at an increased risk for falling. Gait and balance are further complicated by Rt eye blindness and Lt eye poor vision, she uses a walking guide stick to scan the floor in her peripheral. She will benefit from skilled PT to adress her defictis. She is likely more appropriate for HHPT due to her vision and she can not drive. PT will reach out to referring MD about precription for HHPT. In the mean time we will see her for outpatient PT.    Personal Factors and Comorbidities  Comorbidity 1;Comorbidity 2;Comorbidity 3+    Comorbidities  poor vision, CAD, HTN    Examination-Activity Limitations  Locomotion Level;Carry;Squat;Stairs;Lift;Stand    Examination-Participation Restrictions  Meal Prep;Cleaning;Community Activity;Laundry;Shop    Stability/Clinical Decision Making  Evolving/Moderate complexity    Clinical Decision Making  Moderate    Rehab Potential  Good    PT Frequency  2x / week    PT Duration  6 weeks    PT Treatment/Interventions  ADLs/Self Care Home Management;Gait training;Stair training;Functional mobility training;Therapeutic activities;Therapeutic exercise;Balance training;Neuromuscular re-education;Patient/family education;Manual techniques;Passive range of motion    PT Next Visit Plan  review and update HEP PRN, check on HHPT precription from MD, if this doesnt get set up then we will continue to see her for balance, gait, leg strength, endurance.    PT Home Exercise Plan  sit to stands no UE support, tandem balance, SLS, side stepping all with UE suport PRN    Recommended  Other Services  HHPT    Consulted and Agree with Plan of Care  Patient;Family member/caregiver    Family Member Consulted  daughter       Patient will benefit from skilled therapeutic intervention in order to improve the following deficits and impairments:  Abnormal gait, Decreased activity tolerance, Decreased balance, Decreased endurance, Decreased strength, Difficulty walking, Pain, Postural dysfunction  Visit Diagnosis: Unsteadiness on feet  Other abnormalities of gait and mobility     Problem List Patient Active Problem List   Diagnosis Date Noted  . Hyperlipidemia 05/25/2019  . Estrogen deficiency 05/24/2019  . Headache 06/30/2017  . Cervical osteoarthritis 02/17/2017  . Mild cognitive impairment 08/05/2016  . Stress incontinence 06/20/2016  . Calcification of aorta (HCC) 04/15/2016  . At high risk for falls 04/15/2016  . Hypokalemia 03/07/2016  . Health care maintenance 09/21/2014  . 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 Glaucoma 04/22/2007  . Essential hypertension 04/22/2007    Birdie RiddleBrian R Saydie Gerdts,PT,DPT 06/16/2019, 9:55 AM  [facility] [address] [place], [place], [address] Phone: [phone]  Fax:  [phone]  Name: Gavina Dildine MRN: [id number] Date of Birth: 10/17/1935

## 2019-06-16 NOTE — Telephone Encounter (Signed)
Dr. Evette Doffing, I just evaluated [name] DOB February 18, 1936 for outpatient PT. Due to her progressive blindness I feel she may be more appropriate for home health PT. If you agree can you set up a prescription for Home health PT. If not we will continue to see her in outpatient in the meantime. If you have any questions please call us at [phone] or fax to [phone].  Thank you,   Elsie Ra, PT,DPT

## 2019-06-17 NOTE — Telephone Encounter (Signed)
Thank you Aaron Edelman. I am pretty sure I gave her that option when I last saw her, and I think she preferred outpatient PT. If she has changed her mind, please let me know, and I would be happy to order home health PT. If she is doing well with outpatient, I say lets keep going.   Damita Dunnings

## 2019-06-21 ENCOUNTER — Ambulatory Visit: Payer: Medicare Other | Admitting: Physical Therapy

## 2019-06-23 ENCOUNTER — Ambulatory Visit: Payer: Medicare Other | Admitting: Physical Therapy

## 2019-06-23 ENCOUNTER — Other Ambulatory Visit: Payer: Self-pay

## 2019-06-23 DIAGNOSIS — R2689 Other abnormalities of gait and mobility: Secondary | ICD-10-CM

## 2019-06-23 DIAGNOSIS — R293 Abnormal posture: Secondary | ICD-10-CM | POA: Diagnosis not present

## 2019-06-23 DIAGNOSIS — R2681 Unsteadiness on feet: Secondary | ICD-10-CM

## 2019-06-23 NOTE — Therapy (Signed)
[facility] [address] [place], [place], [address] Phone: [phone]   Fax:  [phone]  Physical Therapy Treatment  Patient Details  Name: [name] MRN: [id number] Date of Birth: [birth year] Referring Provider (PT): Tyson Alias, MD   Encounter Date: 06/23/2019  PT End of Session - 06/23/19 0913    Visit Number  2    Number of Visits  12    Date for PT Re-Evaluation  07/28/19   will try to get HHPT set up, but if not will continue to see in clinic   Authorization Type  MCR/tricare    PT Start Time  0845    PT Stop Time  0923    PT Time Calculation (min)  38 min    Activity Tolerance  Patient tolerated treatment well    Behavior During Therapy  [facility] for tasks assessed/performed       Past Medical History:  Diagnosis Date  . 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 Left eye sees shadows and is bothered by bright lights. She wears specialty sunglasses which help her with sensitivity.   [name] CAD (coronary artery disease)    nonobstructive, myoview normal 03/2007, done by Dr. Daleen Squibb - EF = 75%  . Glaucoma   . HLD (hyperlipidemia)   . Hypertension     No past surgical history on file.  There were no vitals filed for this visit.  Subjective Assessment - 06/23/19 0856    Subjective  Relays she is fine coming to outpaitent PT for now vs home health. No pain to report. Started off compliant with HEP but then stopped doing them as frequently as prescribed    Pertinent History  Rt eye blind, CAD, HTN    Limitations  Walking;House hold activities    How long can you stand comfortably?  7 min    How long can you walk comfortably?  household distances    Patient Stated Goals  improve balance                       [facility] Adult PT Treatment/Exercise - 06/23/19 0001       Neuro Re-ed    Neuro Re-ed Details   balance in // bars with intermitte UE support PRN march walking, sidestepping, retro walking, walking with head turns up/down and lateral, all X 3 reps up/down length of bars. Tandem balance 1 min X 2      Exercises   Exercises  Other Exercises    Other Exercises   Nu step for endurance 5 min L3 UE/LE, sit to stands no UE support from standard chair 2X5, LAQ 2 lbs X 15 bilat, standing hip abd 2 lbs X 15 bilat, step ups on 6 inch step with bilat UE support X 10 bilat                  PT Long Term Goals - 06/16/19 0948      PT LONG TERM GOAL #1   Title  Patient will perform updated/finalized HEP (with family assist) to address weakness and decreased balance (Target 07/28/19)    Baseline  not doing any HEP    Time  6    Period  Weeks    Status  New      PT LONG TERM GOAL #2  Title  Pt will improve 5TSTS test to less than 14 sec to show improved leg strength and balance.    Baseline  24    Time  6    Period  Weeks    Status  New      PT LONG TERM GOAL #3   Title  Patient will improve Berg Balance score by 8 points to >51 demonstrating reduced fall risk    Baseline  42 today, did get up to 50 last year    Time  6    Period  Weeks    Status  New            Plan - 06/23/19 0920    Clinical Impression Statement  Todays session focused on neurorehab for balance, then threx for LE strength and overall endurance. She has poor activity tolerance and needs frequent breaks. PT will progress this as able. She was encouraged to perform her HEP more frequently at home and to perform a couple of them at a time during commercials.    Personal Factors and Comorbidities  Comorbidity 1;Comorbidity 2;Comorbidity 3+    Comorbidities  poor vision, CAD, HTN    Examination-Activity Limitations  Locomotion Level;Carry;Squat;Stairs;Lift;Stand    Examination-Participation Restrictions  Meal Prep;Cleaning;Community Activity;Laundry;Shop     Stability/Clinical Decision Making  Evolving/Moderate complexity    Rehab Potential  Good    PT Frequency  2x / week    PT Duration  6 weeks    PT Treatment/Interventions  ADLs/Self Care Home Management;Gait training;Stair training;Functional mobility training;Therapeutic activities;Therapeutic exercise;Balance training;Neuromuscular re-education;Patient/family education;Manual techniques;Passive range of motion    PT Next Visit Plan  will see her for outpatient as she prefers this at this time she says. Progress strength, endurance, gait/balance as able    PT Home Exercise Plan  sit to stands no UE support, tandem balance, SLS, side stepping all with UE suport PRN    Consulted and Agree with Plan of Care  Patient;Family member/caregiver    Family Member Consulted  daughter       Patient will benefit from skilled therapeutic intervention in order to improve the following deficits and impairments:  Abnormal gait, Decreased activity tolerance, Decreased balance, Decreased endurance, Decreased strength, Difficulty walking, Pain, Postural dysfunction  Visit Diagnosis: Unsteadiness on feet  Other abnormalities of gait and mobility  Abnormal posture     Problem List Patient Active Problem List   Diagnosis Date Noted  . Hyperlipidemia 05/25/2019  . Estrogen deficiency 05/24/2019  . Headache 06/30/2017  . Cervical osteoarthritis 02/17/2017  . Mild cognitive impairment 08/05/2016  . Stress incontinence 06/20/2016  . Calcification of aorta (HCC) 04/15/2016  . At high risk for falls 04/15/2016  . Hypokalemia 03/07/2016  . Health care maintenance 09/21/2014  . 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 Glaucoma 04/22/2007  . Essential hypertension 04/22/2007    Debbe Odea ,PT,DPT 06/23/2019, 9:30 AM  [facility] [address] [place], [place], [address] Phone: [phone]   Fax:   [phone]  Name: [name] MRN: [id number] Date of Birth: July 01, 1936

## 2019-07-01 ENCOUNTER — Ambulatory Visit
Payer: Medicare Other | Attending: Student in an Organized Health Care Education/Training Program | Admitting: Physical Therapy

## 2019-07-01 ENCOUNTER — Other Ambulatory Visit: Payer: Self-pay

## 2019-07-01 ENCOUNTER — Telehealth: Payer: Self-pay | Admitting: Physical Therapy

## 2019-07-01 DIAGNOSIS — Z9181 History of falling: Secondary | ICD-10-CM

## 2019-07-01 DIAGNOSIS — R293 Abnormal posture: Secondary | ICD-10-CM | POA: Insufficient documentation

## 2019-07-01 DIAGNOSIS — R2681 Unsteadiness on feet: Secondary | ICD-10-CM | POA: Diagnosis not present

## 2019-07-01 DIAGNOSIS — R2689 Other abnormalities of gait and mobility: Secondary | ICD-10-CM | POA: Insufficient documentation

## 2019-07-01 NOTE — Telephone Encounter (Signed)
Dr. Evette Doffing,   I just saw Peter Congo at [facility] for Outpatient PT. Lerlene would now prefer to receive home health PT services due to family burden of having to drive patient to outpatient PT appointments. If you agree, please set up referral for Home Health PT services. If you have any questions please call us at [phone] or fax to [phone].   Thank you,    Janann August, PT, DPT

## 2019-07-01 NOTE — Therapy (Signed)
[facility] [address] [place], [place], [address] Phone: [phone]   Fax:  [phone]  Physical Therapy Treatment  Patient Details  Name: [name] MRN: [id number] Date of Birth: [birth year] Referring Provider (PT): Tyson AliasVincent, Duncan Thomas, MD   Encounter Date: 07/01/2019  PT End of Session - 07/01/19 1053    Visit Number  3    Number of Visits  12    Date for PT Re-Evaluation  07/28/19   will try to get HHPT set up, but if not will continue to see in clinic   Authorization Type  MCR/tricare    PT Start Time  0851    PT Stop Time  0934    PT Time Calculation (min)  43 min    Equipment Utilized During Treatment  Gait belt    Activity Tolerance  Patient tolerated treatment well    Behavior During Therapy  [facility] for tasks assessed/performed       Past Medical History:  Diagnosis Date  . 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 Left eye sees shadows and is bothered by bright lights. She wears specialty sunglasses which help her with sensitivity.   [name]. CAD (coronary artery disease)    nonobstructive, myoview normal 03/2007, done by Dr. Daleen SquibbWall - EF = 75%  . Glaucoma   . HLD (hyperlipidemia)   . Hypertension     No past surgical history on file.  There were no vitals filed for this visit.  Subjective Assessment - 07/01/19 0857    Subjective  Has a lot of pain this morning. "These legs are killing me". In the front of her thighs. Will go away eventually when she ignores it. Has not been doing her exercises at home. She states that now she would prefer having home health therapies come to her house.    Pertinent History  Rt eye blind, CAD, HTN    Limitations  Walking;House hold activities    How long can you stand comfortably?  7 min    How long can you walk comfortably?  household distances     Patient Stated Goals  improve balance    Currently in Pain?  Yes    Pain Score  7     Pain Location  --   anterior thigh.   Pain Orientation  Right;Left    Pain Descriptors / Indicators  Aching                       [facility] Adult PT Treatment/Exercise - 07/01/19 1059      Transfers   Transfers  Sit to Stand;Stand to Sit    Five time sit to stand comments   1 x 5 reps from lower blue mat table without UE support, cues for eccentric control as pt tends to sit backwards posteriorly      Ambulation/Gait   Ambulation/Gait  Yes    Ambulation/Gait Assistance  4: Min assist;5: Supervision    Ambulation/Gait Assistance Details  Min A when pt would not ambulate for walking stick - verbal and tactile directions on where to walk. Remainder of walking was with walking stick    Ambulation Distance (Feet)  115 Feet    Assistive device  --   walking guide stick   Ambulation Surface  Level;Indoor      Therapeutic Activites  Therapeutic Activities  Other Therapeutic Activities    Other Therapeutic Activities  Discussed importance of performing HEP daily as pt reports she has not been performing it at home. Printed out new packet of HEP with exercise tracker attached so pt's granddaughter can check off when pt has performed exercises for compliance. Discussed with pt HHPT or outpatient PT services (as she had the decision prior with her PCP) - pt now states that she would prefer HHPT services due to difficulty and burden of asking for a ride to [place] services. Therapist stating she would ask PCP for referral. Granddaughter also in agreement at end of session.         Neuro Re-ed    Neuro Re-ed Details   Tandem walking down and back in // bars with fingertip support x2 reps. 1 x 10 reps marching in place, 2 x 10 reps hamstring curls. Side stepping down and back 2 reps without UE support, cues for larger step length.       Exercises   Other Exercises   SciFit Level 1.5 with BLE and BUE  for 4 minutes for strengthening, endurance, and ROM.           Balance Exercises - 07/01/19 0916      Balance Exercises: Standing   Standing Eyes Opened  Foam/compliant surface;Narrow base of support (BOS);3 reps;30 secs    Other Standing Exercises  Standing on foam with narrower BOS  2 x 5 reps head nods and head turns, with fingertip support 1 x 10 reps standing marching on foam.        PT Education - 07/01/19 1053    Education Details  importance of doing exercises daily, asking for referral for HHPT    Person(s) Educated  Patient   grandchild   Methods  Explanation;Demonstration    Comprehension  Verbalized understanding;Returned demonstration          PT Long Term Goals - 06/16/19 0948      PT LONG TERM GOAL #1   Title  Patient will perform updated/finalized HEP (with family assist) to address weakness and decreased balance (Target 07/28/19)    Baseline  not doing any HEP    Time  6    Period  Weeks    Status  New      PT LONG TERM GOAL #2   Title  Pt will improve 5TSTS test to less than 14 sec to show improved leg strength and balance.    Baseline  24    Time  6    Period  Weeks    Status  New      PT LONG TERM GOAL #3   Title  Patient will improve Berg Balance score by 8 points to >51 demonstrating reduced fall risk    Baseline  42 today, did get up to 50 last year    Time  6    Period  Weeks    Status  New            Plan - 07/01/19 1105    Clinical Impression Statement  Today's skilled session focused on LE strengthening, standing balance, and activity tolerance/endurance. Pt needed frequent seated and standing rest breaks throughout session today. Further educated pt of importance of performing HEP more frequently in order to improve strength and balance at home. Pt states that she would now prefer to receive HHPT due to increased difficulty with family providing transportation to [place]. Will ask for a referral for HHPT from  pt's PCP.    Personal  Factors and Comorbidities  Comorbidity 1;Comorbidity 2;Comorbidity 3+    Comorbidities  poor vision, CAD, HTN    Examination-Activity Limitations  Locomotion Level;Carry;Squat;Stairs;Lift;Stand    Examination-Participation Restrictions  Meal Prep;Cleaning;Community Activity;Laundry;Shop    Stability/Clinical Decision Making  Evolving/Moderate complexity    Rehab Potential  Good    PT Frequency  2x / week    PT Duration  6 weeks    PT Treatment/Interventions  ADLs/Self Care Home Management;Gait training;Stair training;Functional mobility training;Therapeutic activities;Therapeutic exercise;Balance training;Neuromuscular re-education;Patient/family education;Manual techniques;Passive range of motion    PT Next Visit Plan  did she get referral for home health? Progress strength, endurance, gait/balance as able    PT Home Exercise Plan  sit to stands no UE support, tandem balance, SLS, side stepping all with UE suport PRN    Consulted and Agree with Plan of Care  Patient;Family member/caregiver    Family Member Consulted  grand daughter       Patient will benefit from skilled therapeutic intervention in order to improve the following deficits and impairments:  Abnormal gait, Decreased activity tolerance, Decreased balance, Decreased endurance, Decreased strength, Difficulty walking, Pain, Postural dysfunction  Visit Diagnosis: Unsteadiness on feet  Other abnormalities of gait and mobility  Abnormal posture     Problem List Patient Active Problem List   Diagnosis Date Noted  . Hyperlipidemia 05/25/2019  . Estrogen deficiency 05/24/2019  . Headache 06/30/2017  . Cervical osteoarthritis 02/17/2017  . Mild cognitive impairment 08/05/2016  . Stress incontinence 06/20/2016  . Calcification of aorta (HCC) 04/15/2016  . At high risk for falls 04/15/2016  . Hypokalemia 03/07/2016  . Health care maintenance 09/21/2014  . Blindness of right eye with low vision in contralateral eye  06/27/2011  . Glaucoma 04/22/2007  . Essential hypertension 04/22/2007    Drake Leach, PT, DPT  07/01/2019, 11:07 AM  [facility] [address] [place], [place], [address] Phone: [phone]   Fax:  [phone]  Name: Airyn Ellzey MRN: [id number] Date of Birth: 1936-05-23

## 2019-07-05 ENCOUNTER — Ambulatory Visit: Payer: Medicare Other | Admitting: Physical Therapy

## 2019-07-05 ENCOUNTER — Encounter: Payer: Self-pay | Admitting: Physical Therapy

## 2019-07-05 ENCOUNTER — Other Ambulatory Visit: Payer: Self-pay

## 2019-07-05 VITALS — BP 161/81 | HR 75

## 2019-07-05 DIAGNOSIS — R2681 Unsteadiness on feet: Secondary | ICD-10-CM

## 2019-07-05 DIAGNOSIS — R2689 Other abnormalities of gait and mobility: Secondary | ICD-10-CM | POA: Diagnosis not present

## 2019-07-05 DIAGNOSIS — R293 Abnormal posture: Secondary | ICD-10-CM | POA: Diagnosis not present

## 2019-07-05 NOTE — Therapy (Signed)
[facility] [address] [place], [place], [address] Phone: [phone]   Fax:  [phone]  Physical Therapy Treatment  Patient Details  Name: Diamond Dolinmelia Frommer MRN: [id number] Date of Birth: January 30, 1936 Referring Provider (PT): Tyson AliasVincent, Duncan Thomas, MD   Encounter Date: 07/05/2019  PT End of Session - 07/05/19 1201    Visit Number  4    Number of Visits  12    Date for PT Re-Evaluation  07/28/19   will try to get HHPT set up, but if not will continue to see in clinic   Authorization Type  MCR/tricare    PT Start Time  0933    PT Stop Time  1013    PT Time Calculation (min)  40 min    Equipment Utilized During Treatment  Gait belt    Activity Tolerance  Patient tolerated treatment well;Other (comment)   limited due to high BP due to pt not taking her medication   Behavior During Therapy  [facility] for tasks assessed/performed       Past Medical History:  Diagnosis Date  . 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 Left eye sees shadows and is bothered by bright lights. She wears specialty sunglasses which help her with sensitivity.   [name]. CAD (coronary artery disease)    nonobstructive, myoview normal 03/2007, done by Dr. Daleen SquibbWall - EF = 75%  . Glaucoma   . HLD (hyperlipidemia)   . Hypertension     History reviewed. No pertinent surgical history.  Vitals:   07/05/19 0940 07/05/19 0951 07/05/19 1006  BP: (!) 186/90 (!) 188/84 (!) 161/81  Pulse: 78 79 75    Subjective Assessment - 07/05/19 0937    Subjective  States that she woke up with her head feeling heavy this morning - states this is not unusual. Has been trying to do more of her exercises at home. Does not remember if she took her BP medication this morning.    Pertinent History  Rt eye blind, CAD, HTN    Limitations  Walking;House hold  activities    How long can you stand comfortably?  7 min    How long can you walk comfortably?  household distances    Patient Stated Goals  improve balance    Currently in Pain?  No/denies                       [facility] Treatment/Exercise - 07/05/19 1003      Transfers   Transfers  Sit to Stand;Stand to Sit    Five time sit to stand comments   1 x 5 reps from arm chair, cues for not using UE support, pt with poor eccentric control, favors R side       Ambulation/Gait   Ambulation/Gait  Yes    Ambulation/Gait Assistance  4: Min assist;5: Supervision    Ambulation/Gait Assistance Details  Throughout session, needed min A for directional changes for pt walking with walking guide stick     Ambulation Distance (Feet)  100 Feet    Assistive device  Other (Comment)   walking guide stick    Ambulation Surface  Level;Indoor      Self-Care   Self-Care  Other Self-Care Comments    Other Self-Care Comments   Educated pt about BP contraindications to participate in  therapy, carefully monitored throughout session. Educated pt to take her BP medication once she gets home and to monitor, also relayed information to pt's granddaughter who verbalized understanding,      Exercises   Other Exercises   with 2 lb ankle weight: 1 x 10 reps LAQs, 2 x 10 reps seated marching           Balance Exercises - 07/05/19 0958      Balance Exercises: Standing   Tandem Stance  Eyes open;15 secs;4 reps;Upper extremity support 2   fingertip support   Other Standing Exercises  Standing on foam: heel/toe raises weight shifting 1 x 10 reps, marching 1 x 10 reps, alternating UE lifts 1 x 10 reps B - intermittent/UE support for all.         PT Education - 07/05/19 1200    Education Details  taking BP medication once she is home, therapist following up with pt's PCP for home health referral.    Person(s) Educated  Patient;Other (comment)   grandchildren   Methods   Explanation;Demonstration    Comprehension  Verbalized understanding;Returned demonstration          PT Long Term Goals - 06/16/19 0948      PT LONG TERM GOAL #1   Title  Patient will perform updated/finalized HEP (with family assist) to address weakness and decreased balance (Target 07/28/19)    Baseline  not doing any HEP    Time  6    Period  Weeks    Status  New      PT LONG TERM GOAL #2   Title  Pt will improve 5TSTS test to less than 14 sec to show improved leg strength and balance.    Baseline  24    Time  6    Period  Weeks    Status  New      PT LONG TERM GOAL #3   Title  Patient will improve Berg Balance score by 8 points to >51 demonstrating reduced fall risk    Baseline  42 today, did get up to 50 last year    Time  6    Period  Weeks    Status  New            Plan - 07/05/19 1208    Clinical Impression Statement  Today's session limited due to pt with high BP readings initially at start of session (186/90) - pt reporting she does not remember if she took her medication this morning. Today's session focused on static standing balance and seated LE strengthening, pt's BP checked halfway at 188/84 and last reading at end of session was 161/84. Rest breaks taken throughout session and pt asymptomatic. Pt stating that she wants to start HHPT. PT asked PCP for referral and am still waiting to hear back.    Personal Factors and Comorbidities  Comorbidity 1;Comorbidity 2;Comorbidity 3+    Comorbidities  poor vision, CAD, HTN    Examination-Activity Limitations  Locomotion Level;Carry;Squat;Stairs;Lift;Stand    Examination-Participation Restrictions  Meal Prep;Cleaning;Community Activity;Laundry;Shop    Stability/Clinical Decision Making  Evolving/Moderate complexity    Rehab Potential  Good    PT Frequency  2x / week    PT Duration  6 weeks    PT Treatment/Interventions  ADLs/Self Care Home Management;Gait training;Stair training;Functional mobility  training;Therapeutic activities;Therapeutic exercise;Balance training;Neuromuscular re-education;Patient/family education;Manual techniques;Passive range of motion    PT Next Visit Plan  did she get referral for home health? Progress strength, endurance, gait/balance  as able    PT Home Exercise Plan  sit to stands no UE support, tandem balance, SLS, side stepping all with UE suport PRN    Consulted and Agree with Plan of Care  Patient;Family member/caregiver    Family Member Consulted  grand daughter       Patient will benefit from skilled therapeutic intervention in order to improve the following deficits and impairments:  Abnormal gait, Decreased activity tolerance, Decreased balance, Decreased endurance, Decreased strength, Difficulty walking, Pain, Postural dysfunction  Visit Diagnosis: Other abnormalities of gait and mobility  Unsteadiness on feet  Abnormal posture     Problem List Patient Active Problem List   Diagnosis Date Noted  . Hyperlipidemia 05/25/2019  . Estrogen deficiency 05/24/2019  . Headache 06/30/2017  . Cervical osteoarthritis 02/17/2017  . Mild cognitive impairment 08/05/2016  . Stress incontinence 06/20/2016  . Calcification of aorta (HCC) 04/15/2016  . At high risk for falls 04/15/2016  . Hypokalemia 03/07/2016  . Health care maintenance 09/21/2014  . 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 Glaucoma 04/22/2007  . Essential hypertension 04/22/2007    Drake Leach, PT, DPT  07/05/2019, 12:10 PM  [facility] [address] [place], [place], [address] Phone: [phone]   Fax:  [phone]  Name: [name] MRN: [id number] Date of Birth: Oct 31, 1935

## 2019-07-12 NOTE — Telephone Encounter (Signed)
I have sent a community message to [facility] to see if they will be able to take patient [name] C11/16/20209:33 AM

## 2019-07-13 DIAGNOSIS — I700 Atherosclerosis of aorta: Secondary | ICD-10-CM | POA: Diagnosis not present

## 2019-07-13 DIAGNOSIS — Z79891 Long term (current) use of opiate analgesic: Secondary | ICD-10-CM | POA: Diagnosis not present

## 2019-07-13 DIAGNOSIS — E785 Hyperlipidemia, unspecified: Secondary | ICD-10-CM | POA: Diagnosis not present

## 2019-07-13 DIAGNOSIS — M47812 Spondylosis without myelopathy or radiculopathy, cervical region: Secondary | ICD-10-CM | POA: Diagnosis not present

## 2019-07-13 DIAGNOSIS — Z9001 Acquired absence of eye: Secondary | ICD-10-CM | POA: Diagnosis not present

## 2019-07-13 DIAGNOSIS — H5461 Unqualified visual loss, right eye, normal vision left eye: Secondary | ICD-10-CM | POA: Diagnosis not present

## 2019-07-13 DIAGNOSIS — N393 Stress incontinence (female) (male): Secondary | ICD-10-CM | POA: Diagnosis not present

## 2019-07-13 DIAGNOSIS — R32 Unspecified urinary incontinence: Secondary | ICD-10-CM | POA: Diagnosis not present

## 2019-07-13 DIAGNOSIS — Z9181 History of falling: Secondary | ICD-10-CM | POA: Diagnosis not present

## 2019-07-13 DIAGNOSIS — G3184 Mild cognitive impairment, so stated: Secondary | ICD-10-CM | POA: Diagnosis not present

## 2019-07-13 DIAGNOSIS — H409 Unspecified glaucoma: Secondary | ICD-10-CM | POA: Diagnosis not present

## 2019-07-13 DIAGNOSIS — Z970 Presence of artificial eye: Secondary | ICD-10-CM | POA: Diagnosis not present

## 2019-07-13 DIAGNOSIS — H539 Unspecified visual disturbance: Secondary | ICD-10-CM | POA: Diagnosis not present

## 2019-07-13 DIAGNOSIS — I10 Essential (primary) hypertension: Secondary | ICD-10-CM | POA: Diagnosis not present

## 2019-07-14 ENCOUNTER — Telehealth: Payer: Self-pay | Admitting: *Deleted

## 2019-07-14 NOTE — Telephone Encounter (Signed)
Received call from Amy PT with [facility] requesting VO for [facility] PT 2 week 3 and 1 week 2 to work on strengthening, balance, safety, fall prevention, and home exercise program. Verbal auth give. Will route to PCP for agreement/denial.   Also, Amy needs to report two Level 3 drug interactions between lisinopril and potassium chloride and between clonidine and cyclobenzaprine. And one Level 2 drug interaction between cyclobenzaprine and tramadol. Please advise. Hubbard Hartshorn, BSN, RN-BC

## 2019-07-14 NOTE — Telephone Encounter (Signed)
Agree with VO for RN med management. Thank you.

## 2019-07-14 NOTE — Telephone Encounter (Signed)
Returned call to Amy and relayed info below. She is requesting VO to add [facility] RN for med management. States patient leaves her pills sitting out and she could use help with organizing meds to increase adherence. States patient does not have pravastatin in home. Made Amy aware that #90 with 3 refills sent on 05/25/2019. Amy has placed call to daughter and will ask her to pick this up. Verbal auth given. Will route to PCP for agreement/denial. Hubbard Hartshorn, BSN, RN-BC

## 2019-07-14 NOTE — Telephone Encounter (Signed)
I agree with the VO.   I am aware of the potential drug interactions, we have been monitoring closely. Will continue with current medications.

## 2019-07-14 NOTE — Telephone Encounter (Signed)
[name], PT with [facility] called requesting [facility] PT orders. SOC was 07/13/2019. Hubbard Hartshorn, BSN, RN-BC

## 2019-07-20 ENCOUNTER — Telehealth: Payer: Self-pay | Admitting: Student in an Organized Health Care Education/Training Program

## 2019-07-20 MED ORDER — PRAVASTATIN SODIUM 20 MG PO TABS: 20.0000 mg | ORAL_TABLET | Freq: Every day | ORAL | 1 refills | Status: DC

## 2019-07-20 MED ORDER — NITROGLYCERIN 0.4 MG SL SUBL: 0.4000 mg | SUBLINGUAL_TABLET | SUBLINGUAL | 0 refills | Status: DC | PRN

## 2019-07-20 MED ORDER — ALBUTEROL SULFATE HFA 108 (90 BASE) MCG/ACT IN AERS: 1.0000 | INHALATION_SPRAY | Freq: Four times a day (QID) | RESPIRATORY_TRACT | 1 refills | Status: DC | PRN

## 2019-07-20 NOTE — Telephone Encounter (Signed)
Per [facility] missing medicine [phone]   Need refill on pravastatin (PRAVACHOL) 20 MG tablet, nitroGLYCERIN (NITROSTAT) 0.4 MG SL tablet, albuterol (PROVENTIL HFA) 108 (90 Base) MCG/ACT inhaler  [id number] - [place], [place] [address] AT [address]

## 2019-07-26 ENCOUNTER — Other Ambulatory Visit: Payer: Self-pay

## 2019-07-26 MED ORDER — POTASSIUM CHLORIDE ER 10 MEQ PO TBCR: 10.0000 meq | EXTENDED_RELEASE_TABLET | Freq: Every day | ORAL | 0 refills | Status: DC

## 2019-08-12 DIAGNOSIS — Z9001 Acquired absence of eye: Secondary | ICD-10-CM | POA: Diagnosis not present

## 2019-08-12 DIAGNOSIS — N393 Stress incontinence (female) (male): Secondary | ICD-10-CM | POA: Diagnosis not present

## 2019-08-12 DIAGNOSIS — H5461 Unqualified visual loss, right eye, normal vision left eye: Secondary | ICD-10-CM | POA: Diagnosis not present

## 2019-08-12 DIAGNOSIS — E785 Hyperlipidemia, unspecified: Secondary | ICD-10-CM | POA: Diagnosis not present

## 2019-08-12 DIAGNOSIS — R32 Unspecified urinary incontinence: Secondary | ICD-10-CM | POA: Diagnosis not present

## 2019-08-12 DIAGNOSIS — M47812 Spondylosis without myelopathy or radiculopathy, cervical region: Secondary | ICD-10-CM | POA: Diagnosis not present

## 2019-08-12 DIAGNOSIS — G3184 Mild cognitive impairment, so stated: Secondary | ICD-10-CM | POA: Diagnosis not present

## 2019-08-12 DIAGNOSIS — H409 Unspecified glaucoma: Secondary | ICD-10-CM | POA: Diagnosis not present

## 2019-08-12 DIAGNOSIS — H539 Unspecified visual disturbance: Secondary | ICD-10-CM | POA: Diagnosis not present

## 2019-08-12 DIAGNOSIS — I700 Atherosclerosis of aorta: Secondary | ICD-10-CM | POA: Diagnosis not present

## 2019-08-12 DIAGNOSIS — Z79891 Long term (current) use of opiate analgesic: Secondary | ICD-10-CM | POA: Diagnosis not present

## 2019-08-12 DIAGNOSIS — Z970 Presence of artificial eye: Secondary | ICD-10-CM | POA: Diagnosis not present

## 2019-08-12 DIAGNOSIS — I10 Essential (primary) hypertension: Secondary | ICD-10-CM | POA: Diagnosis not present

## 2019-08-12 DIAGNOSIS — Z9181 History of falling: Secondary | ICD-10-CM | POA: Diagnosis not present

## 2019-08-17 ENCOUNTER — Other Ambulatory Visit: Payer: Self-pay | Admitting: Student in an Organized Health Care Education/Training Program

## 2019-08-17 DIAGNOSIS — I10 Essential (primary) hypertension: Secondary | ICD-10-CM | POA: Diagnosis not present

## 2019-08-17 DIAGNOSIS — H5461 Unqualified visual loss, right eye, normal vision left eye: Secondary | ICD-10-CM | POA: Diagnosis not present

## 2019-08-17 DIAGNOSIS — M47812 Spondylosis without myelopathy or radiculopathy, cervical region: Secondary | ICD-10-CM | POA: Diagnosis not present

## 2019-08-17 DIAGNOSIS — H409 Unspecified glaucoma: Secondary | ICD-10-CM | POA: Diagnosis not present

## 2019-08-17 DIAGNOSIS — G3184 Mild cognitive impairment, so stated: Secondary | ICD-10-CM | POA: Diagnosis not present

## 2019-08-17 DIAGNOSIS — H539 Unspecified visual disturbance: Secondary | ICD-10-CM | POA: Diagnosis not present

## 2019-08-19 ENCOUNTER — Other Ambulatory Visit: Payer: Self-pay | Admitting: Neurology

## 2019-08-19 NOTE — Telephone Encounter (Signed)
No further refills until seen.

## 2019-08-24 ENCOUNTER — Telehealth: Payer: Self-pay | Admitting: Student in an Organized Health Care Education/Training Program

## 2019-08-24 NOTE — Telephone Encounter (Signed)
[facility] unavailable, will call back.

## 2019-08-24 NOTE — Telephone Encounter (Signed)
Pls contact [facility] [phone], regarding medications rejected

## 2019-08-25 NOTE — Telephone Encounter (Signed)
Roselyn Reef called back. Explained potassium and gabapentin were sent to [facility] on [address]. She will call them to get them transferred to [facility]. Also, tramadol was denied by PCP possibly 2/2 to fall risk. She will let patient know. Hubbard Hartshorn, BSN, RN-BC

## 2019-09-01 DIAGNOSIS — H409 Unspecified glaucoma: Secondary | ICD-10-CM | POA: Diagnosis not present

## 2019-09-01 DIAGNOSIS — I10 Essential (primary) hypertension: Secondary | ICD-10-CM | POA: Diagnosis not present

## 2019-09-01 DIAGNOSIS — M47812 Spondylosis without myelopathy or radiculopathy, cervical region: Secondary | ICD-10-CM | POA: Diagnosis not present

## 2019-09-01 DIAGNOSIS — G3184 Mild cognitive impairment, so stated: Secondary | ICD-10-CM | POA: Diagnosis not present

## 2019-09-01 DIAGNOSIS — H539 Unspecified visual disturbance: Secondary | ICD-10-CM | POA: Diagnosis not present

## 2019-09-01 DIAGNOSIS — H5461 Unqualified visual loss, right eye, normal vision left eye: Secondary | ICD-10-CM | POA: Diagnosis not present

## 2019-09-02 IMAGING — MR MR HEAD W/O CM
9 of 10 series · 36 of 48 positions shown · non-contrast
Comparison: CT head 02/14/2016

CLINICAL DATA: Slurred speech

EXAM:
MRI HEAD WITHOUT CONTRAST
TECHNIQUE: Multiplanar, multiecho pulse sequences of the brain and surrounding
structures were obtained without intravenous contrast.

[Series 4: DWI · axial · 3.0mm · 1.09mm/px · z∈[-84,+48]mm · 9 of 90 slices shown (1 of 4)]
[im 1/90]
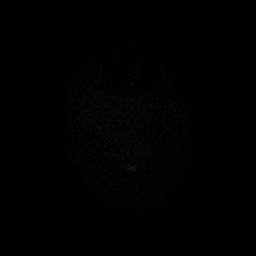
[im 12/90]
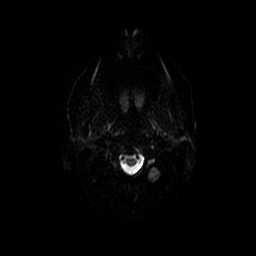
[im 23/90]
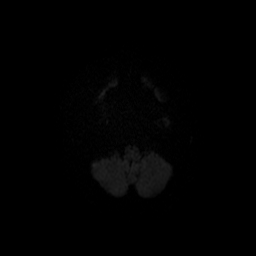
[im 34/90]
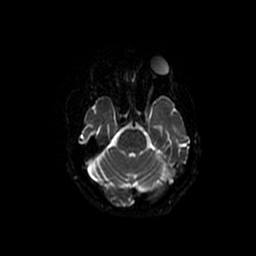
[im 45/90]
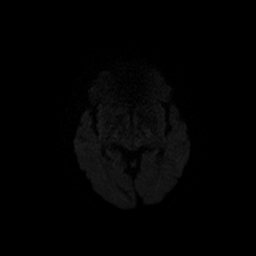
[im 56/90]
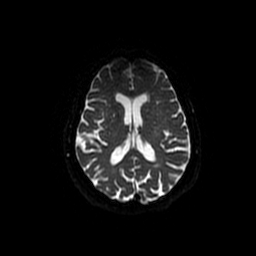
[im 67/90]
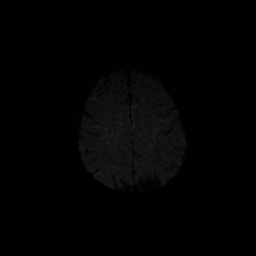
[im 78/90]
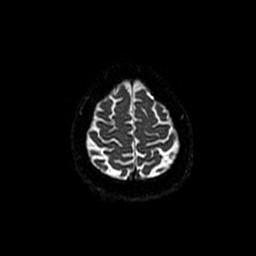
[im 90/90]
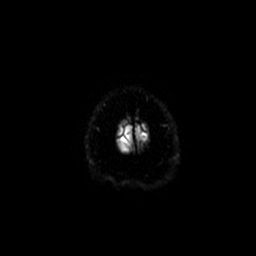

[Series 5: T2 · coronal · 5.0mm · 0.43mm/px · 2 of 25 slices shown (1 of 2)]
[im 1/25]
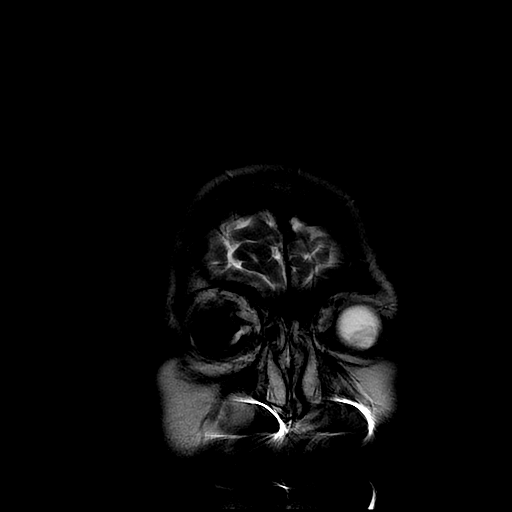
[im 25/25]
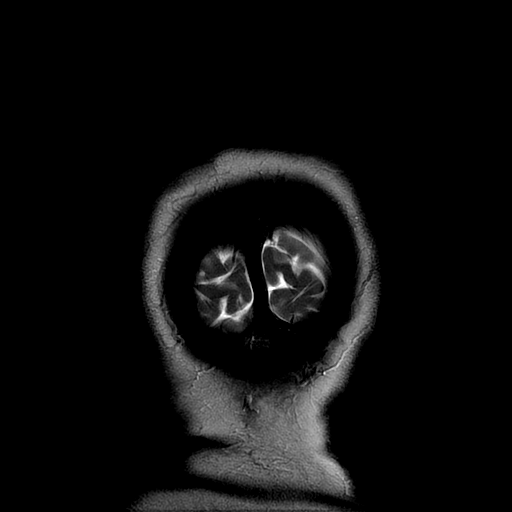

[Series 6: T1 · sagittal · 5.0mm · 0.47mm/px · 3 of 23 slices shown]
[im 1/23]
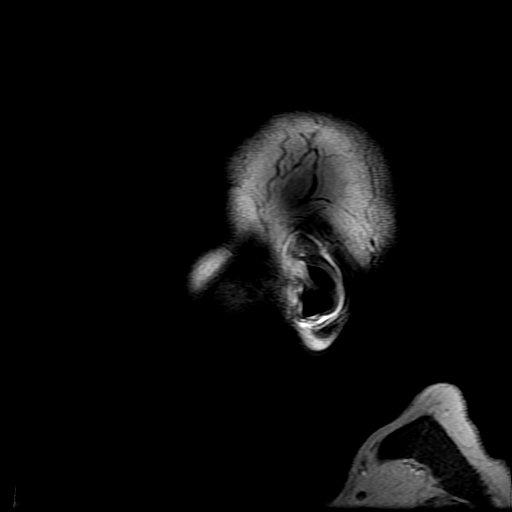
[im 12/23]
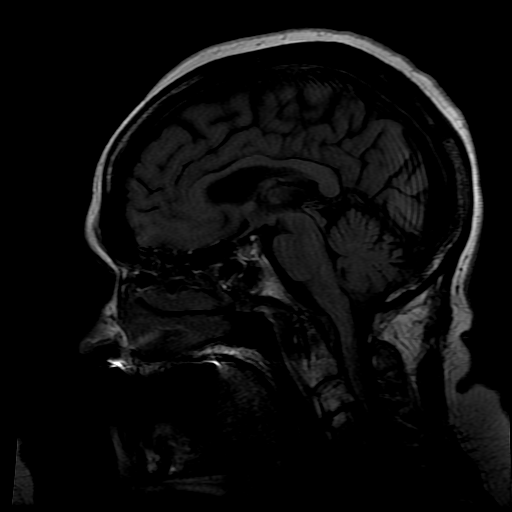
[im 23/23]
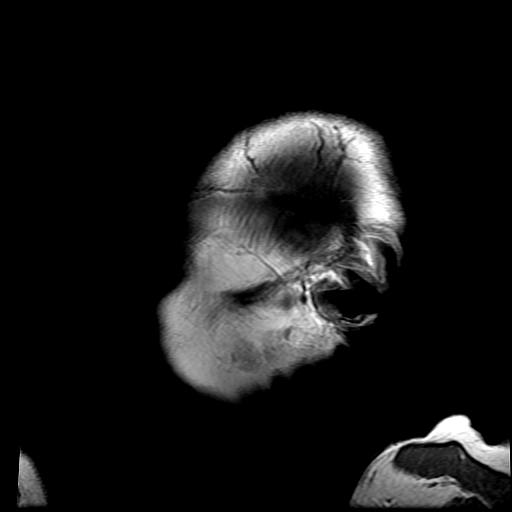

[Series 7: T2 · axial · 5.0mm · 0.43mm/px · z∈[-83,+49]mm · 3 of 23 slices shown (2 of 2)]
[im 1/23]
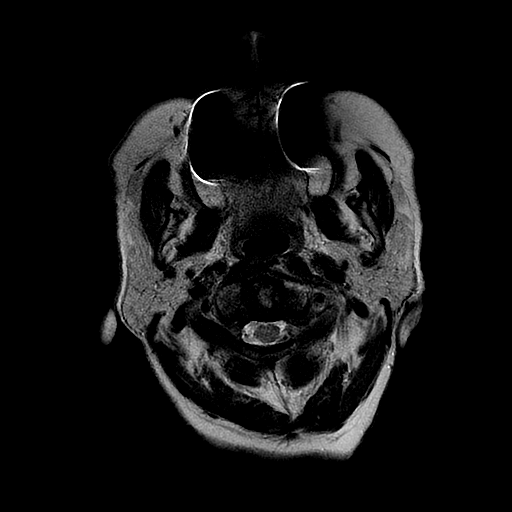
[im 12/23]
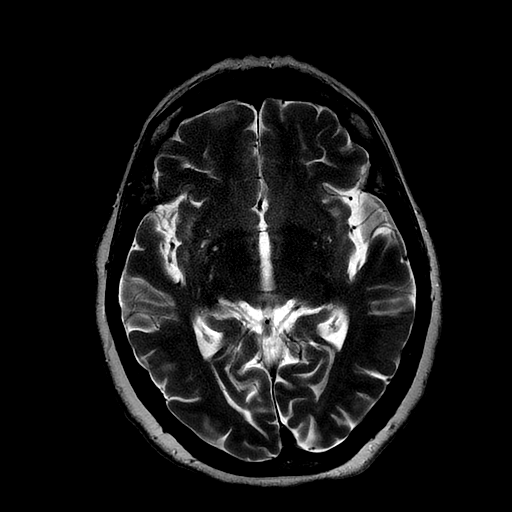
[im 23/23]
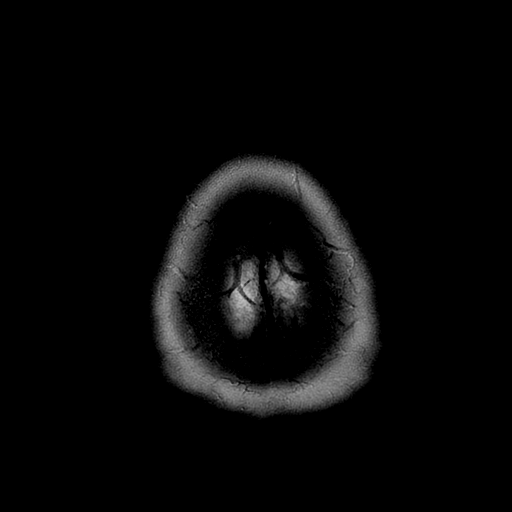

[Series 8: FLAIR · axial · 3.0mm · 0.43mm/px · z∈[-83,+49]mm · 3 of 23 slices shown]
[im 1/23]
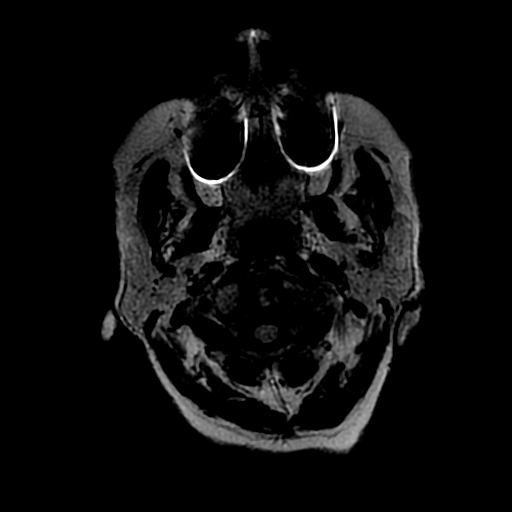
[im 12/23]
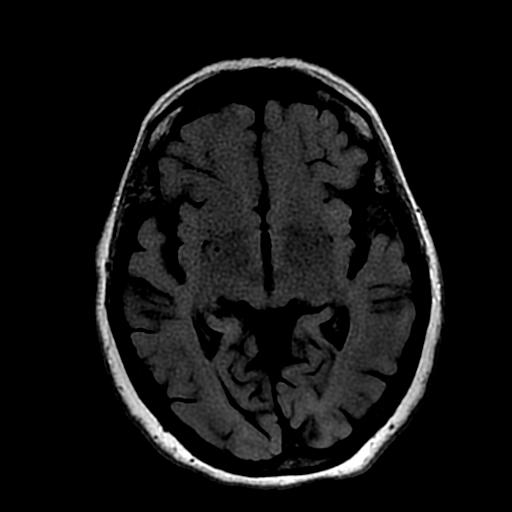
[im 23/23]
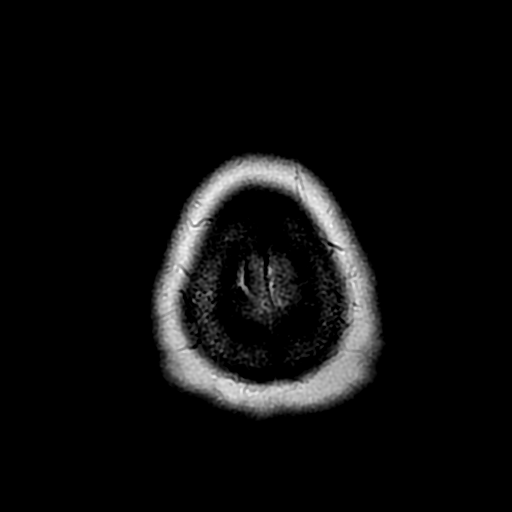

[Series 9: ax mpgr · axial · 5.0mm · 0.43mm/px · 1 of 23 slices shown]
[im 1/23]
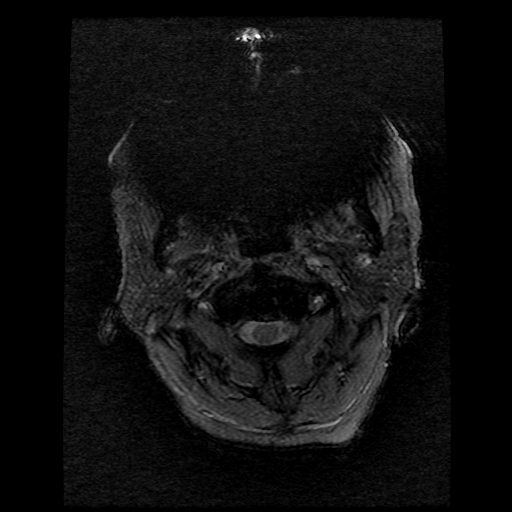

[Series 11: DWI · coronal · 5.0mm · 1.09mm/px · 7 of 62 slices shown (2 of 4)]
[im 1/62]
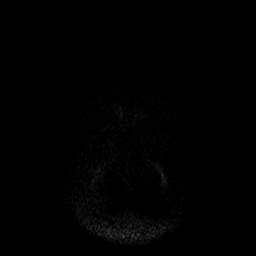
[im 11/62]
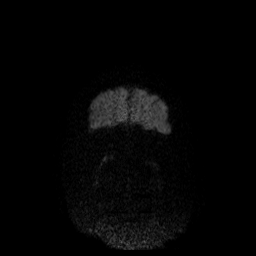
[im 21/62]
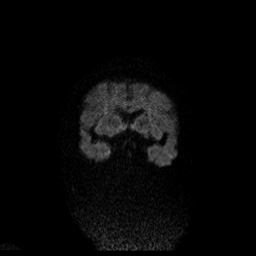
[im 31/62]
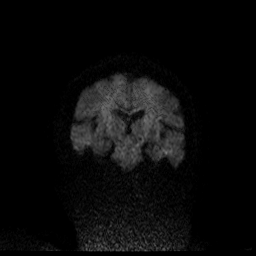
[im 41/62]
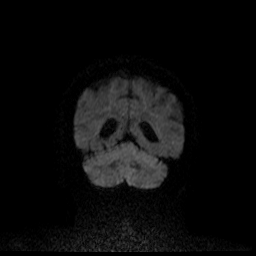
[im 51/62]
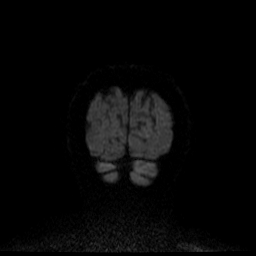
[im 62/62]
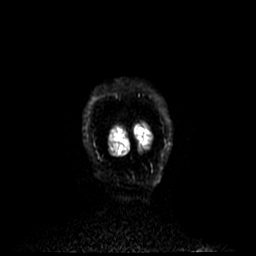

[Series 400: DWI · axial · 3.0mm · 1.09mm/px · z∈[-84,+48]mm · 5 of 45 slices shown (3 of 4)]
[im 1/45]
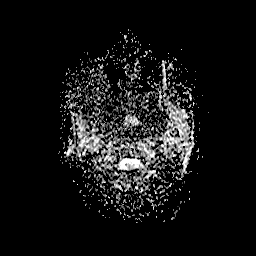
[im 12/45]
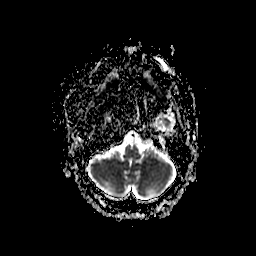
[im 23/45]
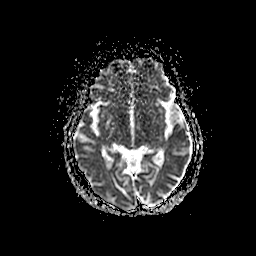
[im 34/45]
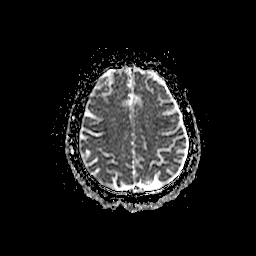
[im 45/45]
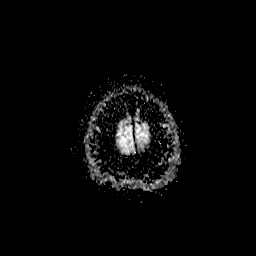

[Series 1100: DWI · coronal · 5.0mm · 1.09mm/px · 3 of 31 slices shown (4 of 4)]
[im 1/31]
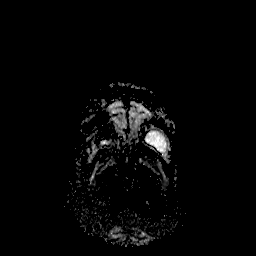
[im 16/31]
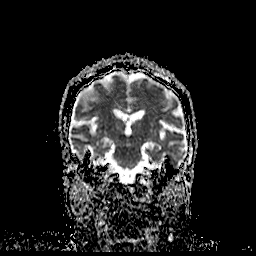
[im 31/31]
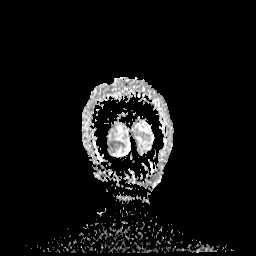

[36 of 48 positions shown; findings below may reference images not displayed]

FINDINGS: Brain: Mild atrophy. Negative for hydrocephalus. Negative for acute
infarct, hemorrhage, or mass. Slight chronic white matter changes
likely due to microvascular chronic ischemia.

Vascular: Normal arterial flow void

Skull and upper cervical spine: Negative

Sinuses/Orbits: Right eye prosthesis with 10 mm fluid collection
medial to the prosthesis on the right. Mild mucosal edema paranasal
sinuses.

Other: Non
IMPRESSION: Mild atrophy without acute abnormality.

## 2019-09-02 IMAGING — MR MR CERVICAL SPINE W/O CM
4 of 6 series · 19 of 48 positions shown · non-contrast
Comparison: CT 02/14/2016

CLINICAL DATA: Cervical radiculopathy. Osteoarthritis cervical
spine with myelopathy

EXAM:
MRI CERVICAL SPINE WITHOUT CONTRAST
TECHNIQUE: Multiplanar, multisequence MR imaging of the cervical spine was
performed. No intravenous contrast was administered.

[Series 2: T2 · sagittal · 3.0mm · 0.39mm/px · 6 of 13 slices shown (1 of 2)]
[im 1/13]
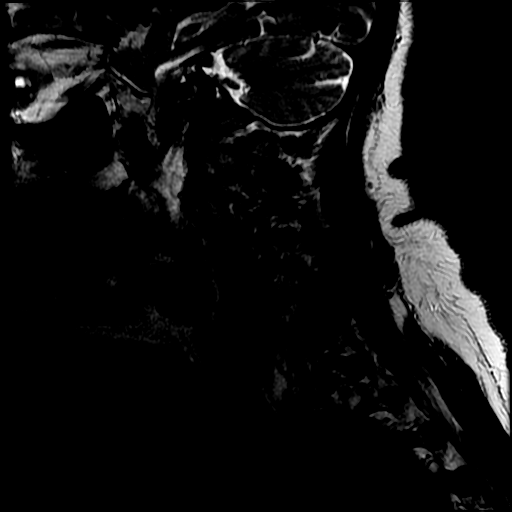
[im 3/13]
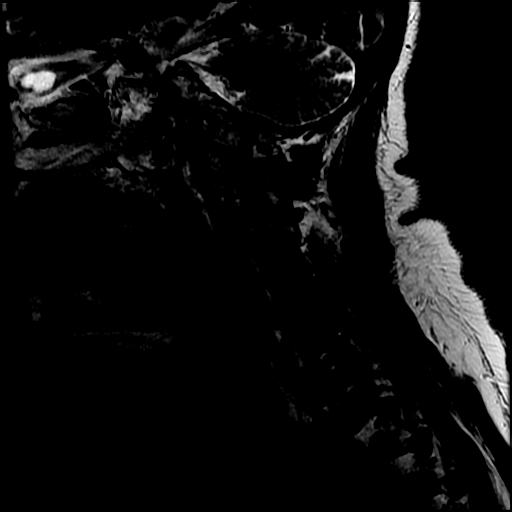
[im 5/13]
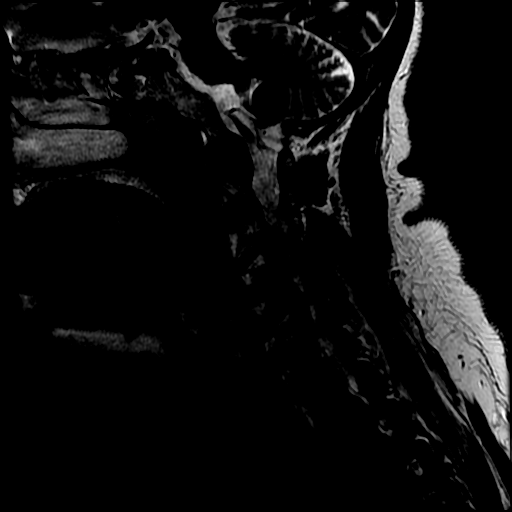
[im 8/13]
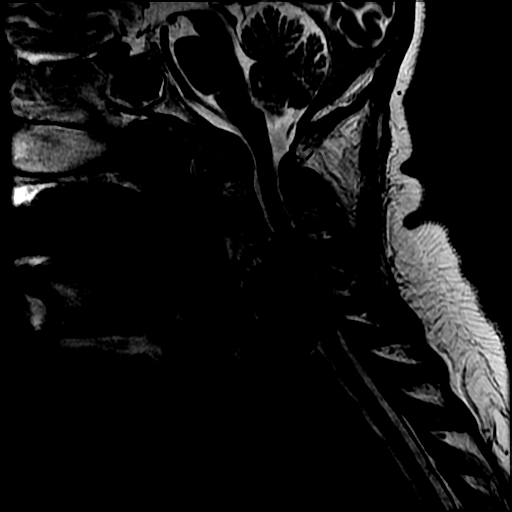
[im 10/13]
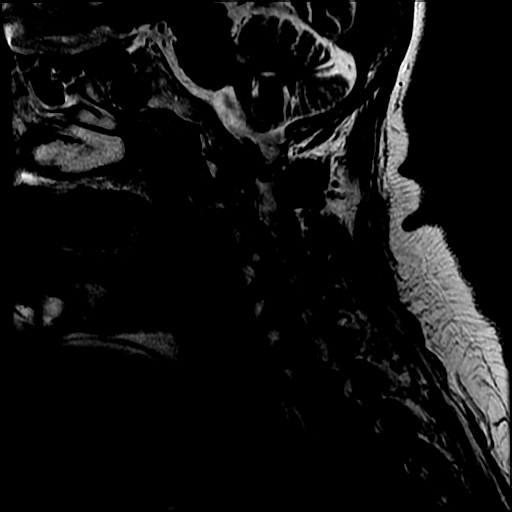
[im 13/13]
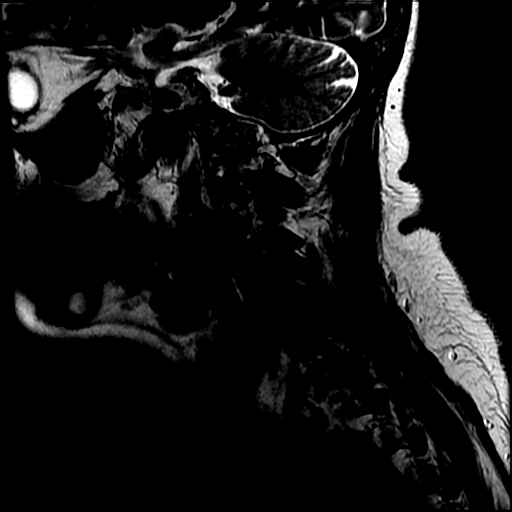

[Series 3: T1 · sagittal · 3.0mm · 0.39mm/px · 3 of 13 slices shown (1 of 2)]
[im 3/13]
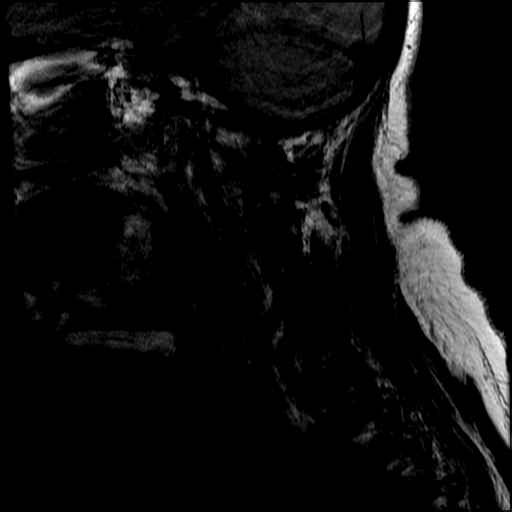
[im 8/13]
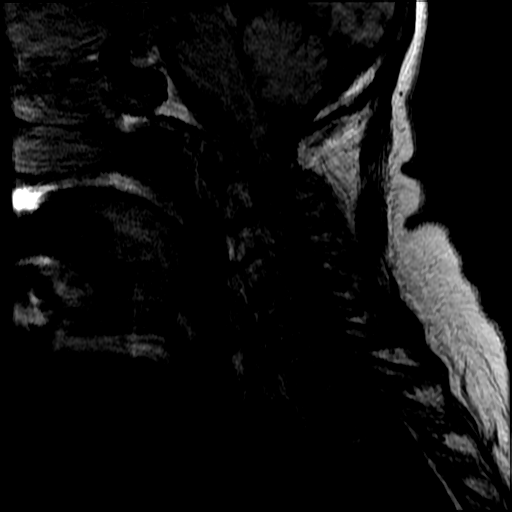
[im 13/13]
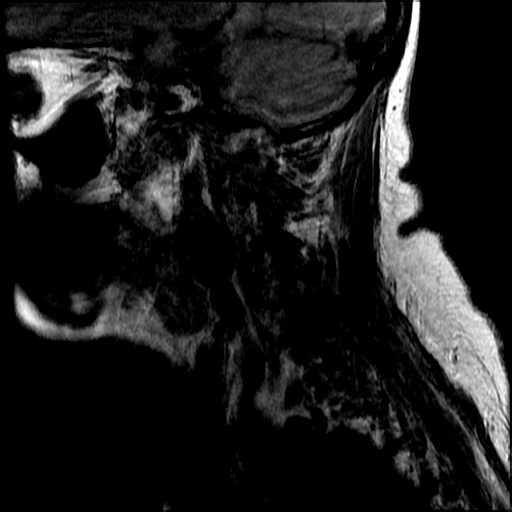

[Series 5: T1 · sagittal · 3.0mm · 0.39mm/px · 3 of 13 slices shown (2 of 2)]
[im 3/13]
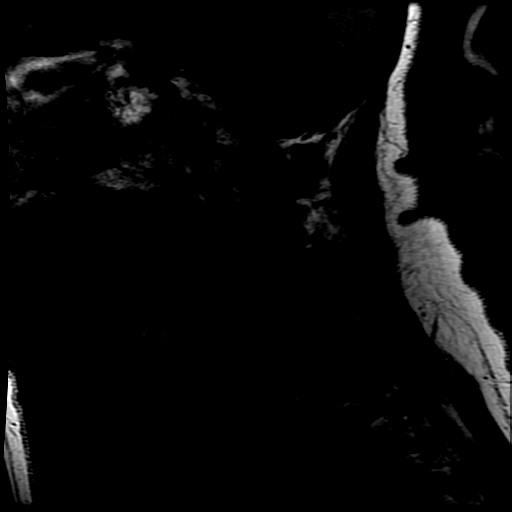
[im 8/13]
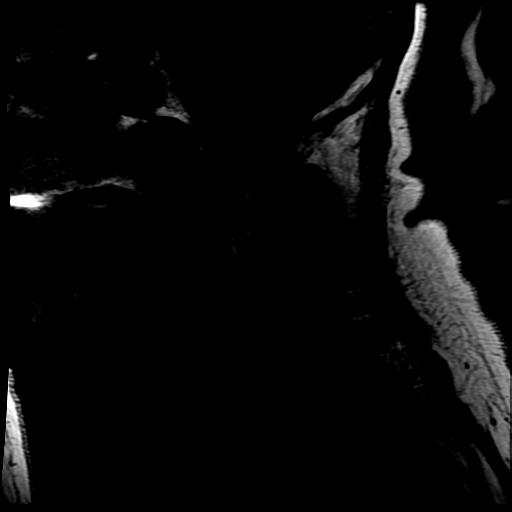
[im 13/13]
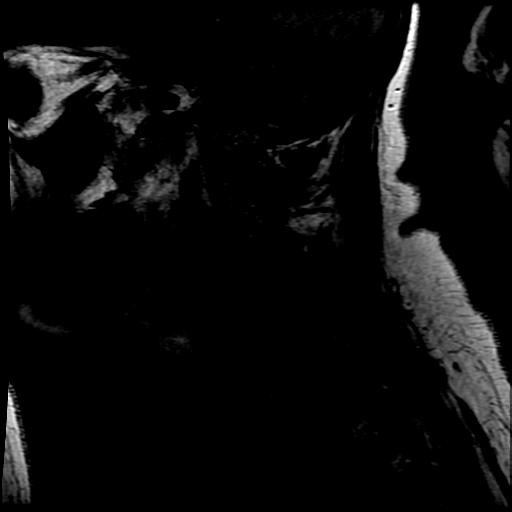

[Series 6: T2 · axial · 3.0mm · 0.39mm/px · z∈[-201,-130]mm · 7 of 26 slices shown (2 of 2)]
[im 1/26]
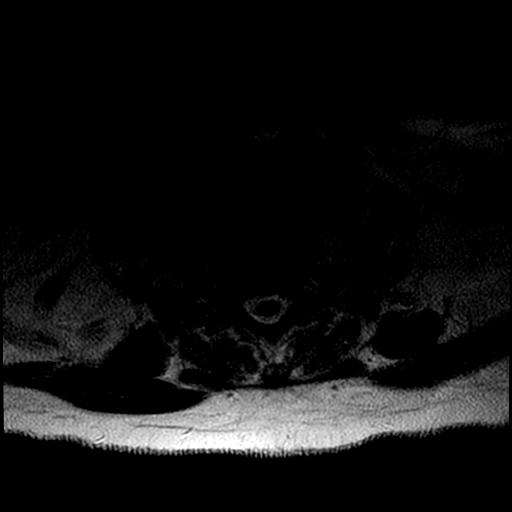
[im 5/26]
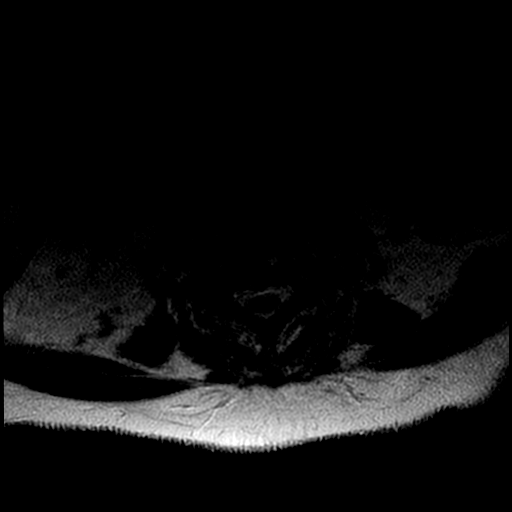
[im 7/26]
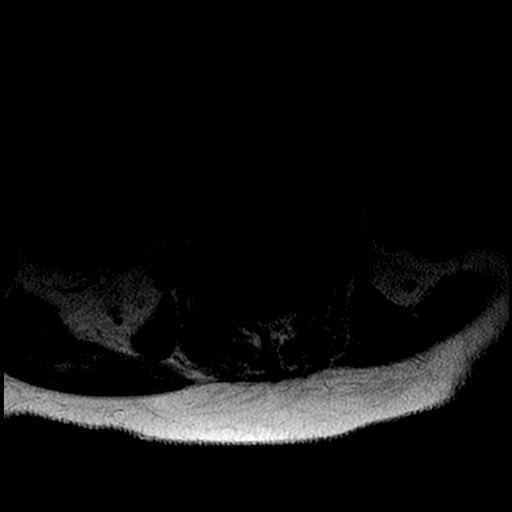
[im 12/26]
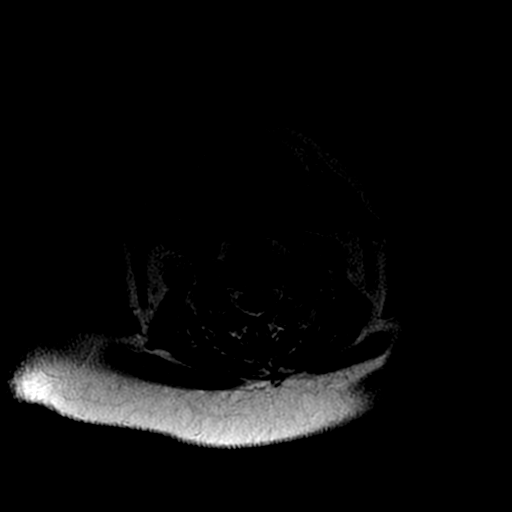
[im 14/26]
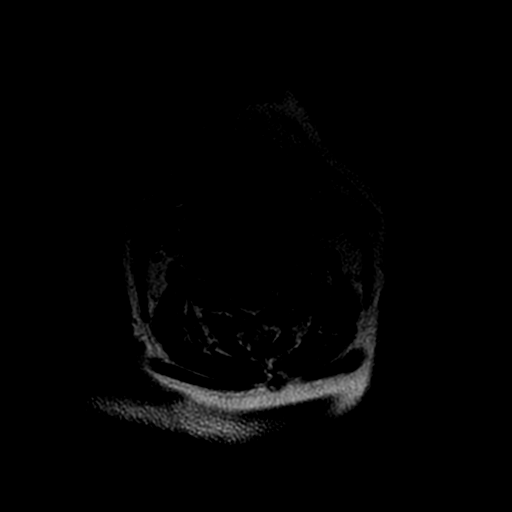
[im 19/26]
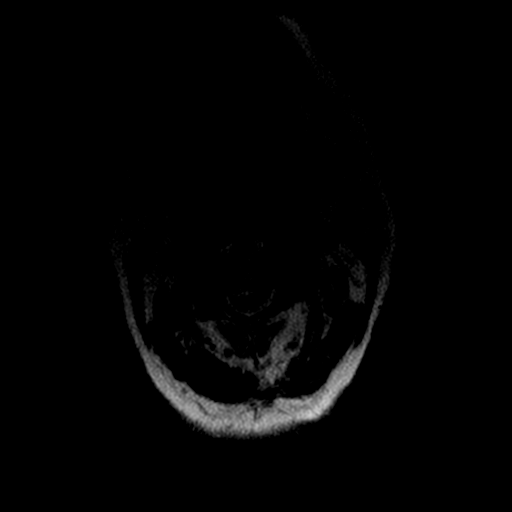
[im 23/26]
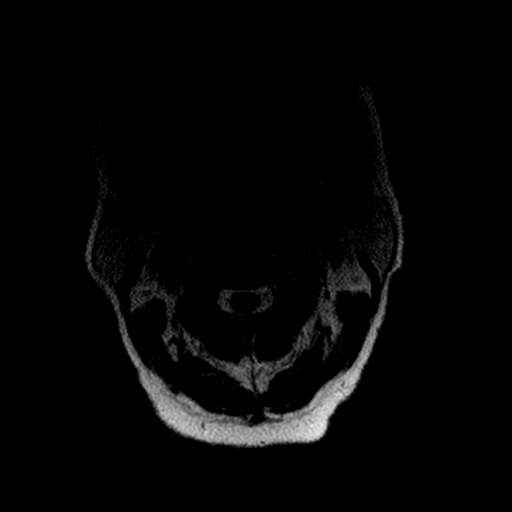

[19 of 48 positions shown; findings below may reference images not displayed]

FINDINGS: Image quality degraded by mild to moderate motion.

Alignment: Mild retrolisthesis C3-4, C5-6, C6-7

Vertebrae: Negative for fracture or mass. Degenerative changes in
the bone marrow.

Cord: Limited cord evaluation. No definite cord lesion or signal
abnormality.

Posterior Fossa, vertebral arteries, paraspinal tissues: Negative

Disc levels:

C2-3:  Mild disc degeneration

C3-4: Moderate to severe disc degeneration and spondylosis. Mild
spinal stenosis and moderate foraminal stenosis bilaterally due to
spurring

C4-5: Mild disc degeneration and spondylosis. Mild spinal stenosis
and mild foraminal stenosis bilaterally due to spurring

C5-6: Advanced disc degeneration with prominent uncinate spurring.
There is moderate spinal stenosis with significant cord flattening.
Moderate foraminal stenosis bilaterally due to spurring.

C6-7: Moderate disc degeneration and spondylosis. Mild spinal
stenosis. Moderate foraminal stenosis bilaterally, moderate
foraminal stenosis bilaterally.

C7-T1:  Mild disc degeneration without stenosis
IMPRESSION: Image quality degraded by significant motion

Multilevel disc degeneration and spondylosis with a chronic
appearance. Spinal and foraminal stenosis at C3-4, C4-5, C5-6 as
described above.

## 2019-09-12 ENCOUNTER — Ambulatory Visit: Payer: Medicare Other | Attending: Internal Medicine

## 2019-09-12 DIAGNOSIS — Z23 Encounter for immunization: Secondary | ICD-10-CM

## 2019-09-12 NOTE — Progress Notes (Signed)
   Covid-19 Vaccination Clinic  Name:  [name]    MRN: [id number] DOB: March 12, 1936  09/12/2019  [name] was observed post Covid-19 immunization for 15 MINS without incidence. She was provided with Vaccine Information Sheet and instruction to access the V-Safe system.   [name] was instructed to call 911 with any severe reactions post vaccine: [name] Difficulty breathing  . Swelling of your face and throat  . A fast heartbeat  . A bad rash all over your body  . Dizziness and weakness

## 2019-09-20 ENCOUNTER — Other Ambulatory Visit: Payer: Self-pay | Admitting: Neurology

## 2019-10-01 ENCOUNTER — Encounter: Payer: Self-pay | Admitting: Physical Therapy

## 2019-10-01 NOTE — Therapy (Signed)
[facility] [address] [place], [place], [address] Phone: [phone]   Fax:  [phone]  Patient Details  Name: [name] MRN: [id number] Date of Birth: 04-15-1936 Referring Provider:  No ref. provider found  Encounter Date: 10/01/2019  Due to transportation difficulties and leaving the house to come to [place], pt wishes to receive HHPT. Pt received referral from MD for HHPT.  PHYSICAL THERAPY DISCHARGE SUMMARY  Visits from Start of Care: 4  Current functional level related to goals / functional outcomes: Unable to assess goals.    Remaining deficits: Impaired balance, gait abnormalities, decr LE strength, decr endurance.    Education / Equipment: HEP  Plan: Patient agrees to discharge.  Patient goals were not met. Patient is being discharged due to the patient's request.  ?????       Arliss Journey, PT, DPT  10/01/2019, 8:37 AM  [facility] [address] [place], [place], [address] Phone: [phone]   Fax:  [phone]

## 2019-10-03 ENCOUNTER — Ambulatory Visit: Payer: Medicare Other | Attending: Internal Medicine

## 2019-10-03 DIAGNOSIS — Z23 Encounter for immunization: Secondary | ICD-10-CM

## 2019-10-03 NOTE — Progress Notes (Signed)
   Covid-19 Vaccination Clinic  Name:  Aily Tzeng    MRN: [id number] DOB: 12/08/1935  10/03/2019  Ms. Gotham was observed post Covid-19 immunization for 15 minutes without incidence. She was provided with Vaccine Information Sheet and instruction to access the V-Safe system.   Ms. Alfred was instructed to call 911 with any severe reactions post vaccine: [name] Difficulty breathing  . Swelling of your face and throat  . A fast heartbeat  . A bad rash all over your body  . Dizziness and weakness    Immunizations Administered    Name Date Dose VIS Date Route   Pfizer COVID-19 Vaccine 10/03/2019 11:13 AM 0.3 mL 08/06/2019 Intramuscular   Manufacturer: [employer], [employer]   Lot: [id number]   NDC: [address]-0

## 2019-12-07 ENCOUNTER — Other Ambulatory Visit: Payer: Self-pay | Admitting: *Deleted

## 2019-12-07 MED ORDER — POTASSIUM CHLORIDE ER 10 MEQ PO TBCR: 10.0000 meq | EXTENDED_RELEASE_TABLET | Freq: Every day | ORAL | 0 refills | Status: DC

## 2020-01-06 ENCOUNTER — Other Ambulatory Visit: Payer: Self-pay | Admitting: *Deleted

## 2020-01-06 MED ORDER — GABAPENTIN 100 MG PO CAPS: ORAL_CAPSULE | ORAL | 3 refills | Status: DC

## 2020-03-02 ENCOUNTER — Other Ambulatory Visit: Payer: Self-pay

## 2020-03-02 ENCOUNTER — Emergency Department (HOSPITAL_COMMUNITY)
Admission: EM | Admit: 2020-03-02 | Discharge: 2020-03-02 | Disposition: A | Discharge status: Home / Self Care | Payer: Medicare Other | Attending: Emergency Medicine | Admitting: Emergency Medicine

## 2020-03-02 ENCOUNTER — Encounter (HOSPITAL_COMMUNITY): Payer: Self-pay

## 2020-03-02 ENCOUNTER — Emergency Department (HOSPITAL_COMMUNITY): Payer: Medicare Other

## 2020-03-02 DIAGNOSIS — I700 Atherosclerosis of aorta: Secondary | ICD-10-CM | POA: Diagnosis not present

## 2020-03-02 DIAGNOSIS — Z87891 Personal history of nicotine dependence: Secondary | ICD-10-CM | POA: Insufficient documentation

## 2020-03-02 DIAGNOSIS — I774 Celiac artery compression syndrome: Secondary | ICD-10-CM | POA: Diagnosis not present

## 2020-03-02 DIAGNOSIS — R079 Chest pain, unspecified: Secondary | ICD-10-CM | POA: Diagnosis not present

## 2020-03-02 DIAGNOSIS — R0789 Other chest pain: Secondary | ICD-10-CM | POA: Diagnosis not present

## 2020-03-02 DIAGNOSIS — I708 Atherosclerosis of other arteries: Secondary | ICD-10-CM | POA: Diagnosis not present

## 2020-03-02 DIAGNOSIS — I2510 Atherosclerotic heart disease of native coronary artery without angina pectoris: Secondary | ICD-10-CM | POA: Insufficient documentation

## 2020-03-02 DIAGNOSIS — K8020 Calculus of gallbladder without cholecystitis without obstruction: Secondary | ICD-10-CM | POA: Insufficient documentation

## 2020-03-02 DIAGNOSIS — I771 Stricture of artery: Secondary | ICD-10-CM | POA: Diagnosis not present

## 2020-03-02 DIAGNOSIS — Z79899 Other long term (current) drug therapy: Secondary | ICD-10-CM | POA: Diagnosis not present

## 2020-03-02 DIAGNOSIS — R001 Bradycardia, unspecified: Secondary | ICD-10-CM | POA: Diagnosis not present

## 2020-03-02 DIAGNOSIS — I10 Essential (primary) hypertension: Secondary | ICD-10-CM | POA: Diagnosis not present

## 2020-03-02 DIAGNOSIS — Z20822 Contact with and (suspected) exposure to covid-19: Secondary | ICD-10-CM | POA: Insufficient documentation

## 2020-03-02 LAB — CBC
HCT: 41.2 % (ref 36.0–46.0)
Hemoglobin: 13.6 g/dL (ref 12.0–15.0)
MCH: 29.5 pg (ref 26.0–34.0)
MCHC: 33 g/dL (ref 30.0–36.0)
MCV: 89.4 fL (ref 80.0–100.0)
Platelets: 275 10*3/uL (ref 150–400)
RBC: 4.61 MIL/uL (ref 3.87–5.11)
RDW: 13 % (ref 11.5–15.5)
WBC: 5 10*3/uL (ref 4.0–10.5)
nRBC: 0 % (ref 0.0–0.2)

## 2020-03-02 LAB — SARS CORONAVIRUS 2 BY RT PCR (HOSPITAL ORDER, PERFORMED IN ~~LOC~~ HOSPITAL LAB): SARS Coronavirus 2: NEGATIVE

## 2020-03-02 LAB — COMPREHENSIVE METABOLIC PANEL
ALT: 11 U/L (ref 0–44)
AST: 16 U/L (ref 15–41)
Albumin: 3.7 g/dL (ref 3.5–5.0)
Alkaline Phosphatase: 118 U/L (ref 38–126)
Anion gap: 11 (ref 5–15)
BUN: 8 mg/dL (ref 8–23)
CO2: 23 mmol/L (ref 22–32)
Calcium: 9.1 mg/dL (ref 8.9–10.3)
Chloride: 107 mmol/L (ref 98–111)
Creatinine, Ser: 1.05 mg/dL — ABNORMAL HIGH (ref 0.44–1.00)
GFR calc Af Amer: 57 mL/min — ABNORMAL LOW (ref >60)
GFR calc non Af Amer: 49 mL/min — ABNORMAL LOW (ref >60)
Glucose, Bld: 133 mg/dL — ABNORMAL HIGH (ref 70–99)
Potassium: 3.3 mmol/L — ABNORMAL LOW (ref 3.5–5.1)
Sodium: 141 mmol/L (ref 135–145)
Total Bilirubin: 0.6 mg/dL (ref 0.3–1.2)
Total Protein: 6.8 g/dL (ref 6.5–8.1)

## 2020-03-02 LAB — URINALYSIS, ROUTINE W REFLEX MICROSCOPIC
Bilirubin Urine: NEGATIVE
Glucose, UA: NEGATIVE mg/dL
Ketones, ur: 5 mg/dL — AB
Leukocytes,Ua: NEGATIVE
Nitrite: NEGATIVE
Protein, ur: NEGATIVE mg/dL
Specific Gravity, Urine: 1.011 (ref 1.005–1.030)
pH: 6 (ref 5.0–8.0)

## 2020-03-02 LAB — TROPONIN I (HIGH SENSITIVITY)
Troponin I (High Sensitivity): 4 ng/L (ref <18)
Troponin I (High Sensitivity): 3 ng/L (ref <18)

## 2020-03-02 LAB — LIPASE, BLOOD: Lipase: 31 U/L (ref 11–51)

## 2020-03-02 MED ORDER — IOHEXOL 350 MG/ML SOLN
100.0000 mL | Freq: Once | INTRAVENOUS | Status: AC | PRN
  Administered 2020-03-02: 100 mL via INTRAVENOUS

## 2020-03-02 MED ORDER — SODIUM CHLORIDE 0.9 % IV BOLUS
1000.0000 mL | Freq: Once | INTRAVENOUS | Status: AC
  Administered 2020-03-02: 1000 mL via INTRAVENOUS

## 2020-03-02 MED ORDER — ONDANSETRON 4 MG PO TBDP
4.0000 mg | ORAL_TABLET | Freq: Once | ORAL | Status: AC
  Administered 2020-03-02: 4 mg via ORAL
  Filled 2020-03-02: qty 1

## 2020-03-02 MED ORDER — FENTANYL CITRATE (PF) 100 MCG/2ML IJ SOLN
50.0000 ug | INTRAMUSCULAR | Status: AC | PRN
  Administered 2020-03-02 (×2): 50 ug via INTRAVENOUS
  Filled 2020-03-02 (×2): qty 2

## 2020-03-02 MED ORDER — ACETAMINOPHEN 325 MG PO TABS
650.0000 mg | ORAL_TABLET | Freq: Once | ORAL | Status: AC
  Administered 2020-03-02: 650 mg via ORAL
  Filled 2020-03-02: qty 2

## 2020-03-02 MED ORDER — LORAZEPAM 2 MG/ML IJ SOLN
1.0000 mg | Freq: Once | INTRAMUSCULAR | Status: DC
  Filled 2020-03-02: qty 1

## 2020-03-02 MED ORDER — POTASSIUM CHLORIDE CRYS ER 20 MEQ PO TBCR
40.0000 meq | EXTENDED_RELEASE_TABLET | Freq: Once | ORAL | Status: AC
  Administered 2020-03-02: 40 meq via ORAL
  Filled 2020-03-02: qty 2

## 2020-03-02 MED ORDER — SODIUM CHLORIDE 0.9% FLUSH
3.0000 mL | Freq: Once | INTRAVENOUS | Status: AC
  Administered 2020-03-02: 3 mL via INTRAVENOUS

## 2020-03-02 NOTE — ED Provider Notes (Signed)
Received signout at the beginning of shift.  Please see previous providers notes for complete H&P.  This is an 84 year old female presenting with complaints of pain to her upper abdomen and chest that started since yesterday.  Pain does radiates towards her back.  Initial work-up which includes chest x-ray which is unremarkable, EKG without concerning changes , deltaTroponin unremarkable, normal WBC, electrolyte panels are reassuring , Normal lipase, UA without signs of urinary tract infection.  Dissection study which includes chest abdomen pelvis CT angiogram was obtained without any evidence of acute vascular pathology.  However there are evidence of cholelithiasis with questionable gallbladder wall thickening and trace pericholecystic fat stranding concerning for acute cholecystitis.  Will obtain limited abdominal ultrasound for further evaluation.  I suspect her pain is related to gallbladder etiology.  7:34 PM Patient became agitated and request to go home.  She appears to be sundowning.  She is walking around the hall.  With gentle guidance, request patient to return back to the room, will provide Ativan for symptom relief.  7:49 PM Limited abdominal ultrasound demonstrate evidence of cholelithiasis but without sonographic evidence of acute cholecystitis.  In the setting of no acute finding both on CT scan and ultrasound, and patient at this time reports she does not have any pain and request to go home.  I believe that she is stable for discharge.  Encourage patient to follow-up outpatient for reevaluation and return precaution given.  Will give patient referral to general surgery for outpatient follow-up as well  BP (!) 172/107   Pulse 80   Temp 97.6 F (36.4 C) (Oral)   Resp (!) 26   Ht 5\' 4"  (1.626 m)   SpO2 97%   BMI 25.76 kg/m   Results for orders placed or performed during the hospital encounter of 03/02/20  Lipase, blood  Result Value Ref Range   Lipase 31 11 - 51 U/L   Comprehensive metabolic panel  Result Value Ref Range   Sodium 141 135 - 145 mmol/L   Potassium 3.3 (L) 3.5 - 5.1 mmol/L   Chloride 107 98 - 111 mmol/L   CO2 23 22 - 32 mmol/L   Glucose, Bld 133 (H) 70 - 99 mg/dL   BUN 8 8 - 23 mg/dL   Creatinine, Ser 05/03/20 (H) 0.44 - 1.00 mg/dL   Calcium 9.1 8.9 - 5.32 mg/dL   Total Protein 6.8 6.5 - 8.1 g/dL   Albumin 3.7 3.5 - 5.0 g/dL   AST 16 15 - 41 U/L   ALT 11 0 - 44 U/L   Alkaline Phosphatase 118 38 - 126 U/L   Total Bilirubin 0.6 0.3 - 1.2 mg/dL   GFR calc non Af Amer 49 (L) >60 mL/min   GFR calc Af Amer 57 (L) >60 mL/min   Anion gap 11 5 - 15  CBC  Result Value Ref Range   WBC 5.0 4.0 - 10.5 K/uL   RBC 4.61 3.87 - 5.11 MIL/uL   Hemoglobin 13.6 12.0 - 15.0 g/dL   HCT 99.2 36 - 46 %   MCV 89.4 80.0 - 100.0 fL   MCH 29.5 26.0 - 34.0 pg   MCHC 33.0 30.0 - 36.0 g/dL   RDW 42.6 83.4 - 19.6 %   Platelets 275 150 - 400 K/uL   nRBC 0.0 0.0 - 0.2 %  Urinalysis, Routine w reflex microscopic  Result Value Ref Range   Color, Urine YELLOW YELLOW   APPearance CLEAR CLEAR   Specific Gravity, Urine  1.011 1.005 - 1.030   pH 6.0 5.0 - 8.0   Glucose, UA NEGATIVE NEGATIVE mg/dL   Hgb urine dipstick SMALL (A) NEGATIVE   Bilirubin Urine NEGATIVE NEGATIVE   Ketones, ur 5 (A) NEGATIVE mg/dL   Protein, ur NEGATIVE NEGATIVE mg/dL   Nitrite NEGATIVE NEGATIVE   Leukocytes,Ua NEGATIVE NEGATIVE   RBC / HPF 6-10 0 - 5 RBC/hpf   WBC, UA 0-5 0 - 5 WBC/hpf   Bacteria, UA RARE (A) NONE SEEN   Squamous Epithelial / LPF 0-5 0 - 5  Troponin I (High Sensitivity)  Result Value Ref Range   Troponin I (High Sensitivity) 4 <18 ng/L  Troponin I (High Sensitivity)  Result Value Ref Range   Troponin I (High Sensitivity) 3 <18 ng/L   US Abdomen Limited  Result Date: 03/02/2020 CLINICAL DATA:  84 year old female with right upper quadrant abdominal pain. EXAM: ULTRASOUND ABDOMEN LIMITED RIGHT UPPER QUADRANT COMPARISON:  CT of the chest abdomen pelvis dated  03/02/2020. FINDINGS: Gallbladder: Gallstones noted. There is no gallbladder wall thickening or pericholecystic fluid. Negative sonographic Murphy's sign. Common bile duct: Diameter: 6 mm Liver: No focal lesion identified. Within normal limits in parenchymal echogenicity. Portal vein is patent on color Doppler imaging with normal direction of blood flow towards the liver. Other: None. IMPRESSION: Cholelithiasis without sonographic evidence of acute cholecystitis. Electronically Signed   By: Elgie Collard M.D.   On: 03/02/2020 19:44   DG Chest Portable 1 View  Result Date: 03/02/2020 CLINICAL DATA:  Chest pain, hypertension EXAM: PORTABLE CHEST 1 VIEW COMPARISON:  04/03/2007, 03/12/2004 FINDINGS: Nodular density within the left perihilar region is stable from remote prior examination likely represents a vascular shadow. The lungs are otherwise clear. No pneumothorax or pleural effusion. Cardiac size within normal limits. No acute bone abnormality. IMPRESSION: No active disease. Electronically Signed   By: Helyn Numbers MD   On: 03/02/2020 14:22   CT Angio Chest/Abd/Pel for Dissection W and/or W/WO  Result Date: 03/02/2020 CLINICAL DATA:  Epigastric pain and nausea since this morning. EXAM: CT ANGIOGRAPHY CHEST, ABDOMEN AND PELVIS TECHNIQUE: Non-contrast CT of the chest was initially obtained. Multidetector CT imaging through the chest, abdomen and pelvis was performed using the standard protocol during bolus administration of intravenous contrast. Multiplanar reconstructed images and MIPs were obtained and reviewed to evaluate the vascular anatomy. CONTRAST:  OMNIPAQUE IOHEXOL 350 MG/ML SOLN COMPARISON:  None. FINDINGS: CTA CHEST FINDINGS Cardiovascular: Preferential opacification of the thoracic aorta. No evidence of thoracic aortic aneurysm or dissection. No intramural hematoma. The heart is at the upper limits of normal in size. No pericardial effusion. Coronary, aortic arch, and branch vessel  atherosclerotic vascular disease. No central pulmonary embolism. Mediastinum/Nodes: No enlarged mediastinal, hilar, or axillary lymph nodes. Thyroid gland, trachea, and esophagus demonstrate no significant findings. Lungs/Pleura: No focal consolidation, pleural effusion, or pneumothorax. No suspicious pulmonary nodule. Musculoskeletal: No chest wall abnormality. No acute or significant osseous findings. Review of the MIP images confirms the above findings. CTA ABDOMEN AND PELVIS FINDINGS VASCULAR Aorta: Normal caliber aorta without aneurysm, dissection, vasculitis or significant stenosis. Extensive atherosclerotic calcification. Celiac: Patent without evidence of aneurysm, dissection, or vasculitis. Moderate stenosis just past the origin due to noncalcified atherosclerotic plaque (series 12, image 113). SMA: Patent without evidence of aneurysm, dissection, vasculitis or significant stenosis. Renals: Both renal arteries are patent without evidence of aneurysm, dissection, vasculitis, fibromuscular dysplasia or significant stenosis. IMA: Patent without evidence of aneurysm, dissection, vasculitis or significant stenosis. Inflow: Patent without evidence  of aneurysm, dissection, vasculitis or significant stenosis. Veins: No obvious venous abnormality within the limitations of this arterial phase study. Review of the MIP images confirms the above findings. NON-VASCULAR Hepatobiliary: No focal liver abnormality is seen. Distended gallbladder. Gallstones with question gallbladder wall thickening and trace pericholecystic fat stranding (series 8, image 151). No biliary dilatation. Pancreas: Unremarkable. No pancreatic ductal dilatation or surrounding inflammatory changes. Spleen: Normal in size without focal abnormality. Adrenals/Urinary Tract: Adrenal glands are unremarkable. Kidneys are normal, without renal calculi, focal lesion, or hydronephrosis. Bladder is unremarkable. Stomach/Bowel: Stomach is within normal limits.  Appendix appears normal. No evidence of bowel wall thickening, distention, or inflammatory changes. Lymphatic: No enlarged abdominal or pelvic lymph nodes. Reproductive: Small calcified of uterine fibroids.  No adnexal mass. Other: No abdominal wall hernia or abnormality. No abdominopelvic ascites. No pneumoperitoneum. Musculoskeletal: No acute or significant osseous findings. Review of the MIP images confirms the above findings. IMPRESSION: Vascular: 1. No evidence of acute aortic syndrome. 2. 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 3. Aortic Atherosclerosis (ICD10-I70.0). Chest: 1.  No acute intrathoracic process. Abdomen and pelvis: 1. Cholelithiasis with questionable gallbladder wall thickening and trace pericholecystic fat stranding. Findings are concerning for acute cholecystitis. Recommend right upper quadrant ultrasound for further evaluation. Electronically Signed   By: Obie Dredge M.D.   On: 03/02/2020 17:12      Fayrene Helper, PA-C 03/02/20 1953    Charlynne Pander, MD 03/03/20 [phone]

## 2020-03-02 NOTE — ED Notes (Signed)
Pt able to ambulate to the bathroom with assistance  

## 2020-03-02 NOTE — ED Notes (Signed)
Notified patients family that the patient has become increasingly agitated/confused and refusing treatment. Patient repeatedly trying to leave and walk out, saying the she doesn't know who brought her here or why. Patient also pulled out IV.  PA, Fayrene Helper aware.  Security and techs at bedside de-escalating patient. Family on the way to be with patient.

## 2020-03-02 NOTE — Discharge Instructions (Signed)
You have evidence of gallstones but no evidence of inflammation of your gallbladder.  Please avoid food that has fatty contents as it may worsen your pain.  Call and follow-up with general surgery for further evaluation and managements of your gallstones.  Return if you have any concern.

## 2020-03-02 NOTE — ED Notes (Addendum)
RN has explained Discharged paperwork , pt refuses VS at this , daughter has arrived to pick pt u[

## 2020-03-02 NOTE — ED Notes (Signed)
Patient aware urine sample needed

## 2020-03-02 NOTE — ED Provider Notes (Addendum)
[facility] EMERGENCY DEPARTMENT Provider Note   CSN: [id number] Arrival date & time: 03/02/20  4097     History Chief Complaint  Patient presents with  . Abdominal Pain  . Chest Pain    [name] is a 84 y.o. female.  HPI  Patient is an 84 year old female with past medical history of HTN, hypokalemia, aortic calcification, mild cognitive impairment, HLD, CAD blindness of right eye  Patient presents today with her daughter who is able to corroborate history.  Per patient she woke up this morning with chest pain at approximately 8 AM.  She states it is 10/10 has been constant since 8 AM and has been radiating to her back.  She states she has mild associated shortness of breath but states that it feels that it is from the pain.  She endorses some mild nausea but no vomiting.  She states that she has been epigastric abdominal pain as well.  She denies the chest pain as pressure.  Denies any pleuritic component.  She has no history of blood clots, no recent surgeries or immobilization, not a cancer patient, has never been on anticoagulation, no recent flights or long trips.  Patient states that she felt completely well yesterday.  Per daughter and patient complains of pain "through the night "when she woke up this morning.  Patient herself does not member this however and states that she feels she woke up with the pain.  Per daughter there is significant family history of myocardial infarction's.  Patient used to be a smoker but quit smoking 10 years ago or more.  Patient states she has not had anything to eat or drink today.  She did take her medications this morning however.   HPI: A 84 year old patient with a history of hypertension and hypercholesterolemia presents for evaluation of chest pain. Initial onset of pain was approximately 3-6 hours ago. The patient's chest pain is described as heaviness/pressure/tightness and is not worse with exertion. The patient  complains of nausea. The patient's chest pain is not middle- or left-sided, is not well-localized, is not sharp and does not radiate to the arms/jaw/neck. The patient denies diaphoresis. The patient has a family history of coronary artery disease in a first-degree relative with onset less than age 68. The patient has no history of stroke, has no history of peripheral artery disease, has not smoked in the past 90 days, denies any history of treated diabetes and does not have an elevated BMI (>=30).   Past Medical History:  Diagnosis Date  . 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 Left eye sees shadows and is bothered by bright lights. She wears specialty sunglasses which help her with sensitivity.   [name] CAD (coronary artery disease)    nonobstructive, myoview normal 03/2007, done by Dr. Daleen Squibb - EF = 75%  . Glaucoma   . HLD (hyperlipidemia)   . Hypertension     Patient Active Problem List   Diagnosis Date Noted  . Hyperlipidemia 05/25/2019  . Estrogen deficiency 05/24/2019  . Headache 06/30/2017  . Cervical osteoarthritis 02/17/2017  . Mild cognitive impairment 08/05/2016  . Stress incontinence 06/20/2016  . Calcification of aorta (HCC) 04/15/2016  . At high risk for falls 04/15/2016  . Hypokalemia 03/07/2016  . Health care maintenance 09/21/2014  . 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  Glaucoma 04/22/2007  . Essential hypertension 04/22/2007    History reviewed. No pertinent surgical history.   OB History   No obstetric history on file.     Family History  Problem Relation Age of Onset  . Stroke Neg Hx   . Cancer Neg Hx     Social History   Tobacco Use  . Smoking status: Former Smoker    Types: Cigarettes  . Smokeless tobacco: Never Used  Substance Use Topics  . Alcohol use: No  . Drug use: No    Home  Medications Prior to Admission medications   Medication Sig Start Date End Date Taking? Authorizing Provider  albuterol (PROVENTIL HFA) 108 (90 Base) MCG/ACT inhaler Inhale 1 puff into the lungs every 6 (six) hours as needed for wheezing or shortness of breath. 07/20/19   Tyson AliasVincent, Duncan Thomas, MD  ALPHAGAN P 0.1 % SOLN NEW PRESCRIPTION REQUEST: INSTILL ONE DROP IN THE LEFT EYE TWICE DAILY 08/17/19   Tyson AliasVincent, Duncan Thomas, MD  bimatoprost (LUMIGAN) 0.03 % ophthalmic drops Place 1 drop into the left eye at bedtime.      [provider]  cloNIDine (CATAPRES) 0.1 MG tablet NEW PRESCRIPTION REQUEST: TAKE ONE TABLET BY MOUTH TWICE DAILY 08/17/19   Tyson AliasVincent, Duncan Thomas, MD  cyclobenzaprine (FLEXERIL) 5 MG tablet NEW PRESCRIPTION REQUEST: TAKE ONE TABLET BY MOUTH AT BEDTIME 08/17/19   Tyson AliasVincent, Duncan Thomas, MD  donepezil (ARICEPT) 5 MG tablet NEW PRESCRIPTION REQUEST: TAKE ONE TABLET BY MOUTH AT BEDTIME 08/17/19   Tyson AliasVincent, Duncan Thomas, MD  gabapentin (NEURONTIN) 100 MG capsule TAKE 2 CAPSULES BY MOUTH IN THE MORNING, AND 4 CAPSULES AT BEDTIME 01/06/20   Tyson AliasVincent, Duncan Thomas, MD  lisinopril (ZESTRIL) 40 MG tablet NEW PRESCRIPTION REQUEST: TAKE ONE TABLET BY MOUTH DAILY 08/17/19   Tyson AliasVincent, Duncan Thomas, MD  nitroGLYCERIN (NITROSTAT) 0.4 MG SL tablet NEW PRESCRIPTION REQUEST: DISSOLVE 1 TABLET UNDER THE TONGUE EVERY 5 MINUTES AS NEEDED FOR CHEST PAIN. DO NOT EXCEED A TOTAL OF 3 DOSES IN 15 MINUTES. 08/17/19   Tyson AliasVincent, Duncan Thomas, MD  potassium chloride (KLOR-CON) 10 MEQ tablet Take 1 tablet (10 mEq total) by mouth daily. 12/07/19   Tyson AliasVincent, Duncan Thomas, MD  pravastatin (PRAVACHOL) 20 MG tablet Take 1 tablet (20 mg total) by mouth daily. 07/20/19   Tyson AliasVincent, Duncan Thomas, MD    Allergies    Aspirin and Codeine  Review of Systems   Review of Systems  Constitutional: Negative for chills and fever.  HENT: Negative for congestion.   Eyes: Negative for pain.  Respiratory: Negative for  cough and shortness of breath.   Cardiovascular: Positive for chest pain. Negative for leg swelling.  Gastrointestinal: Positive for abdominal pain and nausea. Negative for abdominal distention and vomiting.  Genitourinary: Negative for dysuria.  Musculoskeletal: Negative for myalgias.  Skin: Negative for rash.  Neurological: Negative for dizziness and headaches.    Physical Exam Updated Vital Signs BP (!) 180/73   Pulse 64   Temp 97.6 F (36.4 C) (Oral)   Resp (!) 32   Ht 5\' 4"  (1.626 m)   SpO2 96%   BMI 25.76 kg/m   Physical Exam Vitals and nursing note reviewed.  Constitutional:      General: She is not in acute distress. HENT:     Head: Normocephalic and atraumatic.     Nose: Nose normal.     Mouth/Throat:     Mouth: Mucous membranes are dry.  Eyes:     General: No scleral icterus.  Cardiovascular:     Rate and Rhythm: Normal rate and regular rhythm.     Pulses: Normal pulses.     Heart sounds: Normal heart sounds.  Pulmonary:     Effort: Pulmonary effort is normal. No respiratory distress.     Breath sounds: No wheezing.  Abdominal:     Palpations: Abdomen is soft.     Tenderness: There is no abdominal tenderness. There is no right CVA tenderness, left CVA tenderness, guarding or rebound.     Comments: Soft nontender abdomen.  No guarding or rebound.  No reproducible abdominal tenderness of the area of pain.  Musculoskeletal:     Cervical back: Normal range of motion and neck supple. No tenderness.     Right lower leg: No edema.     Left lower leg: No edema.     Comments: No tenderness to palpation over midline C, T, L-spine.  No muscular tenderness of the back or trapezius muscles.  Skin:    General: Skin is warm and dry.     Capillary Refill: Capillary refill takes less than 2 seconds.  Neurological:     Mental Status: She is alert and oriented to person, place, and time. Mental status is at baseline.     Comments: No sensory deficit in bilateral upper or  lower extremities.  No notable weakness.  Cranial nerves intact grossly.   Psychiatric:        Mood and Affect: Mood normal.        Behavior: Behavior normal.     ED Results / Procedures / Treatments   Labs (all labs ordered are listed, but only abnormal results are displayed) Labs Reviewed  COMPREHENSIVE METABOLIC PANEL - Abnormal; Notable for the following components:      Result Value   Potassium 3.3 (*)    Glucose, Bld 133 (*)    Creatinine, Ser 1.05 (*)    GFR calc non Af Amer 49 (*)    GFR calc Af Amer 57 (*)    All other components within normal limits  LIPASE, BLOOD  CBC  URINALYSIS, ROUTINE W REFLEX MICROSCOPIC  TROPONIN I (HIGH SENSITIVITY)  TROPONIN I (HIGH SENSITIVITY)    EKG EKG Interpretation  Date/Time:  Thursday March 02 2020 09:16:25 EDT Ventricular Rate:  55 PR Interval:  184 QRS Duration: 88 QT Interval:  442 QTC Calculation: 422 R Axis:   -9 Text Interpretation: Sinus bradycardia Minimal voltage criteria for LVH, may be normal variant ( R in aVL ) Borderline ECG No significant change since last tracing Confirmed by Susy Frizzle [phone]) on 03/02/2020 3:14:24 PM   Radiology DG Chest Portable 1 View  Result Date: 03/02/2020 CLINICAL DATA:  Chest pain, hypertension EXAM: PORTABLE CHEST 1 VIEW COMPARISON:  04/03/2007, 03/12/2004 FINDINGS: Nodular density within the left perihilar region is stable from remote prior examination likely represents a vascular shadow. The lungs are otherwise clear. No pneumothorax or pleural effusion. Cardiac size within normal limits. No acute bone abnormality. IMPRESSION: No active disease. Electronically Signed   By: Helyn Numbers MD   On: 03/02/2020 14:22    Procedures Procedures (including critical care time)  Medications Ordered in ED Medications  fentaNYL (SUBLIMAZE) injection 50 mcg (50 mcg Intravenous Given 03/02/20 1418)  sodium chloride flush (NS) 0.9 % injection 3 mL (3 mLs Intravenous Given 03/02/20 1421)    acetaminophen (TYLENOL) tablet 650 mg (650 mg Oral Given 03/02/20 1422)  sodium chloride 0.9 % bolus 1,000 mL (1,000 mLs Intravenous New Bag/Given 03/02/20  1419)  ondansetron (ZOFRAN-ODT) disintegrating tablet 4 mg (4 mg Oral Given 03/02/20 1422)  potassium chloride SA (KLOR-CON) CR tablet 40 mEq (40 mEq Oral Given 03/02/20 1422)    ED Course  I have reviewed the triage vital signs and the nursing notes.  Pertinent labs & imaging results that were available during my care of the patient were reviewed by me and considered in my medical decision making (see chart for details).  Patient with severe chest and upper abdominal pain radiates to her back.  Is severe she is hypertensive.  Physical exam is notable for nonreproducibility of chest or abdominal pain.  Patient has no history of reflux.  History of gastric ulcers.  She denies feeling short of breath at this moment although she states that sometimes the pain makes her feel short of breath.  She denies any history of blood clots, recent mobilization, surgery, long trips.  Very low suspicion for pulmonary embolism.  Patient has normal heart rate and is not hypoxic.  Physical exam is notable for no lower extremity swelling or pain or calf tenderness.  Otherwise unremarkable exam.  She is having pain but I am unable to reproduce on exam.  I discussed this case with my attending physician who cosigned this note including patient's presenting symptoms, physical exam, and planned diagnostics and interventions. Attending physician stated agreement with plan or made changes to plan which were implemented.   Attending physician assessed patient at bedside.  Plan to obtain dissection study.  Heart score is 5.  Patient and her daughter at bedside state that they would prefer to avoid admission to the hospital if possible.  We will leave this to oncoming team after dissection study is conducted.  I believe that she would be a reasonable discharge if her pain is  under control and dissection study is negative and she prefers discharge.  Clinical Course as of Mar 02 1602  Thu Mar 02, 2020  1349 I reviewed EKG which is NSR with no axis deviation, normal R wave progression, regular rate and rhythm with P waves present, normal intervals and no evidence of acute ischemia.   [WF]    Clinical Course User Index [WF] Gailen Shelter, [place]   Events.  Lipase within normal limits doubt pancreatitis.  CBC without leukocytosis or anemia.  CMP with mild hypokalemia 3.3 no other acute abnormalities.  We will orally replete potassium. DG chest without mediastinal widening or infiltrate.  No acute abnormality noted.  Patient given Tylenol, Zofran, potassium, 1 L normal saline and fentanyl.  4:03 PM patient care transferred to oncoming team.  They will follow up on CT dissection study and disposition appropriately based on patient's symptoms and preferences given her chest pain.  MDM Rules/Calculators/A&P HEAR Score: 5                        Final Clinical Impression(s) / ED Diagnoses Final diagnoses:  Chest pain, unspecified type  Epigastric pain    Rx / DC Orders ED Discharge Orders    None       Gailen Shelter, [place] 03/02/20 1603    Solon Augusta McKay, [place] 03/02/20 1604    Pollyann Savoy, MD 03/02/20 [phone]

## 2020-03-02 NOTE — ED Triage Notes (Signed)
Patient complains of epigastric pain with nausea since am. Patient reports that the pain started after taking her mourning meds and had not eaten. Patient has dementia and lives with daughter. Alert to baseline.

## 2020-03-05 ENCOUNTER — Other Ambulatory Visit: Payer: Self-pay | Admitting: Student in an Organized Health Care Education/Training Program

## 2020-03-06 ENCOUNTER — Encounter: Payer: Self-pay | Admitting: Student

## 2020-03-06 ENCOUNTER — Telehealth: Payer: Self-pay | Admitting: *Deleted

## 2020-03-06 ENCOUNTER — Other Ambulatory Visit: Payer: Self-pay

## 2020-03-06 ENCOUNTER — Ambulatory Visit (INDEPENDENT_AMBULATORY_CARE_PROVIDER_SITE_OTHER): Payer: Medicare Other | Admitting: Student

## 2020-03-06 VITALS — BP 180/82 | HR 96 | Temp 98.5°F | Ht 65.0 in | Wt 148.6 lb

## 2020-03-06 DIAGNOSIS — I10 Essential (primary) hypertension: Secondary | ICD-10-CM | POA: Diagnosis not present

## 2020-03-06 DIAGNOSIS — K8050 Calculus of bile duct without cholangitis or cholecystitis without obstruction: Secondary | ICD-10-CM

## 2020-03-06 LAB — COMPREHENSIVE METABOLIC PANEL
ALT: 28 U/L (ref 0–44)
AST: 34 U/L (ref 15–41)
Albumin: 3.5 g/dL (ref 3.5–5.0)
Alkaline Phosphatase: 168 U/L — ABNORMAL HIGH (ref 38–126)
Anion gap: 13 (ref 5–15)
BUN: 10 mg/dL (ref 8–23)
CO2: 24 mmol/L (ref 22–32)
Calcium: 9 mg/dL (ref 8.9–10.3)
Chloride: 105 mmol/L (ref 98–111)
Creatinine, Ser: 1.16 mg/dL — ABNORMAL HIGH (ref 0.44–1.00)
GFR calc Af Amer: 51 mL/min — ABNORMAL LOW (ref >60)
GFR calc non Af Amer: 44 mL/min — ABNORMAL LOW (ref >60)
Glucose, Bld: 100 mg/dL — ABNORMAL HIGH (ref 70–99)
Potassium: 3.4 mmol/L — ABNORMAL LOW (ref 3.5–5.1)
Sodium: 142 mmol/L (ref 135–145)
Total Bilirubin: 1.1 mg/dL (ref 0.3–1.2)
Total Protein: 7.5 g/dL (ref 6.5–8.1)

## 2020-03-06 LAB — CBC WITH DIFFERENTIAL/PLATELET
Abs Immature Granulocytes: 0.02 10*3/uL (ref 0.00–0.07)
Basophils Absolute: 0.1 10*3/uL (ref 0.0–0.1)
Basophils Relative: 1 %
Eosinophils Absolute: 0.2 10*3/uL (ref 0.0–0.5)
Eosinophils Relative: 3 %
HCT: 40.8 % (ref 36.0–46.0)
Hemoglobin: 13.3 g/dL (ref 12.0–15.0)
Immature Granulocytes: 0 %
Lymphocytes Relative: 12 %
Lymphs Abs: 1.1 10*3/uL (ref 0.7–4.0)
MCH: 29.4 pg (ref 26.0–34.0)
MCHC: 32.6 g/dL (ref 30.0–36.0)
MCV: 90.3 fL (ref 80.0–100.0)
Monocytes Absolute: 0.8 10*3/uL (ref 0.1–1.0)
Monocytes Relative: 9 %
Neutro Abs: 7.3 10*3/uL (ref 1.7–7.7)
Neutrophils Relative %: 75 %
Platelets: 282 10*3/uL (ref 150–400)
RBC: 4.52 MIL/uL (ref 3.87–5.11)
RDW: 12.9 % (ref 11.5–15.5)
WBC: 9.6 10*3/uL (ref 4.0–10.5)
nRBC: 0 % (ref 0.0–0.2)

## 2020-03-06 MED ORDER — OXYCODONE HCL 5 MG PO TABS: 5.0000 mg | ORAL_TABLET | Freq: Two times a day (BID) | ORAL | 0 refills | Status: DC | PRN

## 2020-03-06 NOTE — Assessment & Plan Note (Addendum)
BP elevated to 180/82 while in clinic.  Daughter states patient did not take her blood pressure medication this morning.  Patient denies any dizziness, headaches or blurry vision patient.  Currently on clonidine 0.1 mg twice daily and lisinopril 40 mg.  Plan: -Continue current regimen. -Reassess further changes at next visit.

## 2020-03-06 NOTE — Telephone Encounter (Signed)
Resent in the prescription for the oxycodone.

## 2020-03-06 NOTE — Patient Instructions (Signed)
Thank you, Ms.Daneil Dolin for allowing [place] to provide your care today. Today we discussed your abdominal pain.  We believe this is due to your gallstone which does not require an emergent surgery but we plan to manage with some pain medications  See your surgeon for removal of the gallstones.  Please continue on a fat-free diet.  I have ordered the following labs for you:   Lab Orders     CBC with Diff     CMP w [place] (STAT/Sunquest-performed on-site)   I will call if any are abnormal. All of your labs can be accessed through "My Chart".   I have ordered the following medication/changed the following medications:  1. Oxycodone 5 mg twice a day as needed for pain.  Please follow-up as needed  Should you have any questions or concerns please call the internal medicine clinic at [phone].    Sharrell Ku, MD, MPH [facility]   My Chart Access: [email or web address]   If you have not already done so, please get your COVID 19 vaccine  To schedule an appointment for a COVID vaccine choice any of the following: Go to [email or web address]   Go to [email or web address]                  Call [phone]                                     Call [phone] and select Option 2

## 2020-03-06 NOTE — Assessment & Plan Note (Addendum)
Patient was seen in the ED 4 days ago for epigastric pain.  Found to have cholelithiasis on abdominal ultrasound.  Patient was discharged home with plan for follow-up outpatient elective cholecystectomy.  Patient has scheduled with surgery for MRCP on 03/08/2020. Patient complaining of ongoing abdominal pain worse with eating since ED visit.  No evidence of cholecystitis or choledocholithiasis on ultrasound or lab findings.  Plan: -CBC reassuring, CMP unremarkable -Start oxycodone 5 mg twice daily for 5 days.  -Follow-up with surgeon for MRCP and subsequent cholecystectomy. -Continue fat-free diet until surgery

## 2020-03-06 NOTE — Telephone Encounter (Signed)
[facility] calls and states that dr Kirke Corin is not yet approved to write for narcotics, please send a new script for the oxy written today please have attending to resend

## 2020-03-06 NOTE — Progress Notes (Signed)
   CC: Abdominal pain  HPI:  [name] is a 84 y.o. with past medical history of HTN, HLD, glaucoma, CAD, blindness in the right eye and aortic calcification who presents to the clinic after a visit to the ED last week.  Patient states she has had pain on and off since she left the ED.  She has tried ibuprofen, Tylenol, tramadol without pain relief.  She reports decreased food intake due to the pain.  Denies any chest pain, back pain, dizziness, headaches, shortness of breath.   Past Medical History:  Diagnosis Date  . 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 Left eye sees shadows and is bothered by bright lights. She wears specialty sunglasses which help her with sensitivity.   [name] CAD (coronary artery disease)    nonobstructive, myoview normal 03/2007, done by Dr. Daleen Squibb - EF = 75%  . Glaucoma   . HLD (hyperlipidemia)   . Hypertension    Review of Systems:  All ROS negative otherwise as stated in the HPI  Physical Exam:  General: Elderly woman. No acute distress. Well nourished, well developed. Head: Normocephalic, atraumatic w/o tenderness Eyes: PERRLA. Sclera is non-icteric Throat: MMM. No tonsillar swelling or exudate Neck: Supple without adenopathy. Thyroid gland midline without masees Cardiac: RRR. No murmurs, rubs or gallops. S1, S2. No lower extremities edema Respiratory: Lungs CTAB. No wheezing or crackles. No increased WOB Abdominal: Soft, symmetric.  Moderate RLQ tenderness. Rebound tenderness.  Mild guarding. Evidence of Murphy sign on sonographic exam. No organomegaly. Normal bowel sounds Skin: Warm, dry and intact without rashes or lesions Extremities: Atraumatic. Full ROM. Pulse palpable. Neuro: A&O x 3. Moves all extremities Psych: Appropriate mood and affect  Vitals:   03/06/20 1404  BP: (!) 180/82  Pulse: 96  Temp: 98.5 F (36.9 C)    TempSrc: Oral  SpO2: 100%  Weight: 148 lb 9.6 oz (67.4 kg)  Height: 5\' 5"  (1.651 m)    Assessment & Plan:   See Encounters Tab for problem based charting.  Patient seen with Dr. , MD, MPH

## 2020-03-07 ENCOUNTER — Telehealth: Payer: Self-pay | Admitting: *Deleted

## 2020-03-07 NOTE — Telephone Encounter (Signed)
The issue is that I am not sure she is having an MRI. I don't see anything ordered, I have not ordered an MRI. All I know is that she has an appointment with a surgeon on Wednesday. She does not need me to write for an ativan for an office appointment. If the surgeon orders an MRI, I am sure they can order a tablet of ativan as well.

## 2020-03-07 NOTE — Progress Notes (Signed)
Internal Medicine Clinic Attending  I saw and evaluated the patient.  I personally confirmed the key portions of the history and exam documented by Dr. Kirke Corin and I reviewed pertinent patient test results.  The assessment, diagnosis, and plan were formulated together and I agree with the documentation in the resident's note.   Cholelithiasis with symptomatic biliary colic that is very bothersome to her at home for over a week. She is able to eat and drink, but a little limited by post prandial pain. She has no fevers or other systemic symptoms. She has a mixed exam, not sick appearing, but abdomen does have moderate tense guarding and a murphy sign. Labs are reassuring, no leukocytosis, normal bilirubin. It seems likely that this is still just cholelithiasis, no signs of cholecystitis or choledocholithiasis. She has good support at home from her daughter, and has follow up planned with [facility] [place] in two days, ultimate treatment will be cholecystectomy. We will escalate supportive care by adding low dose oxycodone for pain control, I gave her warnings about risk of falls and confusion, daughter understands the risk/benefits. Gave return precautions if any worsening symptoms.

## 2020-03-07 NOTE — Telephone Encounter (Signed)
Pt got a little calmer during this call. Spoke to her about dr Oswaldo Done concern. Explained what usually happens at a first appt and then if MD feels need for MRI or other diag image they would be able to prescribe something to calm pt, she understands now and will go to appt and if needed request med there.

## 2020-03-07 NOTE — Addendum Note (Signed)
Addended by: Erlinda Hong T on: 03/07/2020 09:06 AM   Modules accepted: Level of Service

## 2020-03-07 NOTE — Telephone Encounter (Signed)
Pt calls back and is not happy, she states dr Oswaldo Done was supposed to send in a script for an antianxiety med for her procedure and he did not do this, she is very disappointed in him. Please advise.

## 2020-03-15 ENCOUNTER — Telehealth: Payer: Self-pay

## 2020-03-15 NOTE — Telephone Encounter (Signed)
Please call pt's daughter back regarding med.

## 2020-03-15 NOTE — Telephone Encounter (Signed)
Pt called and stated in a very loud voice "you all arent understanding she is having a MRI and needs something to relax her and the doctor doing it cannot prescribe her anything. She did not have a name or ph#. Triage nurse ask her to hold and she hung up. Nurse called [facility] pt has appt 7/28 w/ dr Corliss Skains. Pt is not having mri that day and if she has mri dr tseui will give pt something to help her relax, called daughter back and explained. She said ok, call ended

## 2020-03-22 ENCOUNTER — Ambulatory Visit: Payer: Self-pay | Admitting: General Surgery

## 2020-03-22 DIAGNOSIS — K8020 Calculus of gallbladder without cholecystitis without obstruction: Secondary | ICD-10-CM | POA: Diagnosis not present

## 2020-03-27 ENCOUNTER — Other Ambulatory Visit: Payer: Self-pay | Admitting: Student in an Organized Health Care Education/Training Program

## 2020-03-27 NOTE — Telephone Encounter (Signed)
Pt's daughter Meriam Sprague) requesting a pain prescription until she has her surgery on 05/04/2020.  Please call back.  oxyCODONE (OXY IR/ROXICODONE) 5 MG immediate release tablet  [id number] - [place], [place] - [address] AT [facility] [address]

## 2020-03-27 NOTE — Telephone Encounter (Signed)
RTC, spoke with patient's daughter [name]), [name], who states patient has seen the surgeon and the earliest the cholecystectomy could be scheduled is 05/04/20.  She is requesting a refill on the Oxy 5mg  IR to get her through until surgery and states this is the only medication which has relieved her mother's pain. Will forward to PCP. Thank you, SChaplin, RN,BSN

## 2020-03-28 MED ORDER — OXYCODONE HCL 5 MG PO TABS: 5.0000 mg | ORAL_TABLET | Freq: Two times a day (BID) | ORAL | 0 refills | Status: DC | PRN

## 2020-04-19 ENCOUNTER — Other Ambulatory Visit: Payer: Self-pay | Admitting: *Deleted

## 2020-04-19 MED ORDER — CLONIDINE HCL 0.1 MG PO TABS: 0.1000 mg | ORAL_TABLET | Freq: Two times a day (BID) | ORAL | 3 refills | Status: DC

## 2020-04-25 NOTE — Progress Notes (Signed)
Your procedure is scheduled on Thursday, September 9th.  Report to [facility] Main Entrance "A" at 7:00 A.M., and check in at the Admitting office.  Call this number if you have problems the morning of surgery:  [phone]  Call [phone] if you have any questions prior to your surgery date Monday-Friday 8am-4pm   Remember:  Do not eat after midnight the night before your surgery  You may drink clear liquids until 6:00 A.M. the morning of your surgery.   Clear liquids allowed are: Water, Non-Citrus Juices (without pulp), Carbonated Beverages, Clear Tea, Black Coffee Only, and Gatorade    Take these medicines the morning of surgery with A SIP OF WATER  cloNIDine (CATAPRES)  gabapentin (NEURONTIN)    IF NEEDED: Eye drops, nitroGLYCERIN (NITROSTAT), oxyCODONE (OXY IR/ROXICODONE),   albuterol (PROVENTIL HFA)/inhaler - bring with you the morning of surgery  As of today, STOP taking any Aspirin (unless otherwise instructed by your surgeon) Aleve, Naproxen, Ibuprofen, Motrin, Advil, Goody's, BC's, all herbal medications, fish oil, and all vitamins.                     Do not wear jewelry, make up, or nail polish            Do not wear lotions, powders, perfumes, or deodorant.            Do not shave 48 hours prior to surgery.             Do not bring valuables to the hospital.            [facility] is not responsible for any belongings or valuables.  Do NOT Smoke (Tobacco/Vaping) or drink Alcohol 24 hours prior to your procedure If you use a CPAP at night, you may bring all equipment for your overnight stay.   Contacts, glasses, dentures or bridgework may not be worn into surgery.      For patients admitted to the hospital, discharge time will be determined by your treatment team.   Patients discharged the day of surgery will not be allowed to drive home, and someone needs to stay with them for 24 hours.  Special instructions:   [facility]- Preparing For Surgery  Before  surgery, you can play an important role. Because skin is not sterile, your skin needs to be as free of germs as possible. You can reduce the number of germs on your skin by washing with CHG (chlorahexidine gluconate) Soap before surgery.  CHG is an antiseptic cleaner which kills germs and bonds with the skin to continue killing germs even after washing.    Oral Hygiene is also important to reduce your risk of infection.  Remember - BRUSH YOUR TEETH THE MORNING OF SURGERY WITH YOUR REGULAR TOOTHPASTE  Please do not use if you have an allergy to CHG or antibacterial soaps. If your skin becomes reddened/irritated stop using the CHG.  Do not shave (including legs and underarms) for at least 48 hours prior to first CHG shower. It is OK to shave your face.  Please follow these instructions carefully.   1. Shower the NIGHT BEFORE SURGERY and the MORNING OF SURGERY with CHG Soap.   2. If you chose to wash your hair, wash your hair first as usual with your normal shampoo.  3. After you shampoo, rinse your hair and body thoroughly to remove the shampoo.  4. Use CHG as you would any other liquid soap. You can apply CHG directly to  the skin and wash gently with a scrungie or a clean washcloth.   5. Apply the CHG Soap to your body ONLY FROM THE NECK DOWN.  Do not use on open wounds or open sores. Avoid contact with your eyes, ears, mouth and genitals (private parts). Wash Face and genitals (private parts)  with your normal soap.   6. Wash thoroughly, paying special attention to the area where your surgery will be performed.  7. Thoroughly rinse your body with warm water from the neck down.  8. DO NOT shower/wash with your normal soap after using and rinsing off the CHG Soap.  9. Pat yourself dry with a CLEAN TOWEL.  10. Wear CLEAN PAJAMAS to bed the night before surgery  11. Place CLEAN SHEETS on your bed the night of your first shower and DO NOT SLEEP WITH PETS.  Day of Surgery: Wear  Clean/Comfortable clothing the morning of surgery Do not apply any deodorants/lotions.   Remember to brush your teeth WITH YOUR REGULAR TOOTHPASTE.   Please read over the following fact sheets that you were given.

## 2020-04-26 ENCOUNTER — Other Ambulatory Visit: Payer: Self-pay

## 2020-04-26 ENCOUNTER — Encounter (HOSPITAL_COMMUNITY): Payer: Self-pay

## 2020-04-26 ENCOUNTER — Encounter (HOSPITAL_COMMUNITY)
Admission: RE | Admit: 2020-04-26 | Discharge: 2020-04-26 | Disposition: A | Discharge status: Home / Self Care | Payer: Medicare (Managed Care) | Source: Ambulatory Visit | Attending: General Surgery | Admitting: General Surgery

## 2020-04-26 DIAGNOSIS — Z01812 Encounter for preprocedural laboratory examination: Secondary | ICD-10-CM | POA: Insufficient documentation

## 2020-04-26 HISTORY — DX: Unspecified osteoarthritis, unspecified site: M19.90

## 2020-04-26 LAB — BASIC METABOLIC PANEL
Anion gap: 11 (ref 5–15)
BUN: 9 mg/dL (ref 8–23)
CO2: 23 mmol/L (ref 22–32)
Calcium: 9.3 mg/dL (ref 8.9–10.3)
Chloride: 107 mmol/L (ref 98–111)
Creatinine, Ser: 1.04 mg/dL — ABNORMAL HIGH (ref 0.44–1.00)
GFR calc Af Amer: 58 mL/min — ABNORMAL LOW (ref >60)
GFR calc non Af Amer: 50 mL/min — ABNORMAL LOW (ref >60)
Glucose, Bld: 90 mg/dL (ref 70–99)
Potassium: 3.7 mmol/L (ref 3.5–5.1)
Sodium: 141 mmol/L (ref 135–145)

## 2020-04-26 LAB — CBC
HCT: 43 % (ref 36.0–46.0)
Hemoglobin: 13.7 g/dL (ref 12.0–15.0)
MCH: 28.8 pg (ref 26.0–34.0)
MCHC: 31.9 g/dL (ref 30.0–36.0)
MCV: 90.3 fL (ref 80.0–100.0)
Platelets: 308 10*3/uL (ref 150–400)
RBC: 4.76 MIL/uL (ref 3.87–5.11)
RDW: 13.2 % (ref 11.5–15.5)
WBC: 4.2 10*3/uL (ref 4.0–10.5)
nRBC: 0 % (ref 0.0–0.2)

## 2020-04-26 NOTE — Progress Notes (Signed)
Your procedure is scheduled on Thursday, September 9th.             Report to [facility] Main Entrance "A" at 7:00 A.M., and check in at the Admitting office.             Call this number if you have problems the morning of surgery:             [phone]  Call [phone] if you have any questions prior to your surgery date Monday-Friday 8am-4pm              Remember:             Do not eat after midnight the night before your surgery  You may drink clear liquids until 6:00 A.M. the morning of your surgery.   Clear liquids allowed are: Water, Non-Citrus Juices (without pulp), Carbonated Beverages, Clear Tea, Black Coffee Only, and Gatorade                          Take these medicines the morning of surgery with A SIP OF WATER: Alphagan eye drops CloNIDine (CATAPRES)  Gabapentin (NEURONTIN)    IF NEEDED:  nitroGLYCERIN (NITROSTAT), oxyCODONE (OXY IR/ROXICODONE),   albuterol (PROVENTIL HFA)/inhaler - bring with you the morning of surgery  As of today, STOP taking any Aspirin (unless otherwise instructed by your surgeon) Aleve, Naproxen, Ibuprofen, Motrin, Advil, Goody's, BC's, all herbal medications, fish oil, and all vitamins.  Do not wear jewelry, make up, or nail polish Do notwear lotions, powders, perfumes, or deodorant. Do notshave 48 hours prior to surgery.  Do notbring valuables to the hospital. [facility] is not responsible for any belongings or valuables.  Do NOT Smoke (Tobacco/Vaping) or drink Alcohol 24 hours prior to your procedure If you use a CPAP at night, you may bring all equipment for your overnight stay.  Contacts, glasses, dentures or bridgework may not be worn into surgery.    For patients admitted to the hospital, discharge time will be determined by your treatment team.  Patients discharged the day of surgery will not be allowed to drive home, and someone needs to stay with them  for 24 hours.  Special instructions:   [place]- Preparing For Surgery  Before surgery, you can play an important role. Because skin is not sterile, your skin needs to be as free of germs as possible. You can reduce the number of germs on your skin by washing with CHG (chlorahexidine gluconate) Soap before surgery.  CHG is an antiseptic cleaner which kills germs and bonds with the skin to continue killing germs even after washing.   Please do not use if you have an allergy to CHG or antibacterial soaps. If your skin becomes reddened/irritated stop using the CHG.  Do not shave (including legs and underarms) for at least 48 hours prior to first CHG shower. It is OK to shave your face.  Please follow these instructions carefully.  1. Shower the NIGHT BEFORE SURGERY and the MORNING OF SURGERY with CHG Soap.   2. If you chose to wash your hair, wash your hair first as usual with your normal shampoo.  3. After you shampoo, rinse your hair and body thoroughly to remove the shampoo.  4. Use CHG as you would any other liquid soap. You can apply CHG directly to the skin and wash gently with a scrungie or a clean washcloth.   5. Apply the CHG Soap to your body ONLY FROM THE NECK DOWN.  Do not use on open wounds or open sores. Avoid contact with your eyes, ears, mouth and genitals (private parts). Wash Face and genitals (private parts)  with your normal soap.   6. Wash thoroughly, paying special attention to the area where your surgery will be performed.  7. Thoroughly rinse your body with warm water from the neck down.  8. DO NOT shower/wash with your normal soap after using and rinsing off the CHG Soap.  9. Pat yourself dry with a CLEAN TOWEL.  10. Wear CLEAN PAJAMAS to bed the night before surgery  11. Place CLEAN SHEETS on your bed the night of your  first shower and DO NOT SLEEP WITH PETS.  Day of Surgery: Wear Clean/Comfortable clothing the morning of surgery Do notapply any deodorants/lotions.  Remember to brush your teeth WITH YOUR REGULAR TOOTHPASTE.  Please read over the following fact sheets that you were given.

## 2020-04-26 NOTE — Progress Notes (Signed)
PCP - Dr. Jearld Adjutant  Cardiologist - Denies  Chest x-ray - Denies  EKG - 03/03/20 (E)  Stress Test - Denies  ECHO - Denies  Cardiac Cath - Denies  AICD- na PM- na LOOP- na  Sleep Study - Denies CPAP - Denies  LABS- 04/26/20: CBC, BMP 05/02/20: COVID  ASA- Denies  ERAS-Yes- no drink  HA1C- Denies  Anesthesia- No  Pt denies having chest pain, sob, or fever at this time. All instructions explained to the pt, with a verbal understanding of the material. Pt agrees to go over the instructions while at home for a better understanding. Pt also instructed to self quarantine after being tested for COVID-19. The opportunity to ask questions was provided.   Coronavirus Screening  Have you experienced the following symptoms:  Cough yes/no: No Fever (>100.53F)  yes/no: No Runny nose yes/no: No Sore throat yes/no: No Difficulty breathing/shortness of breath  yes/no: No  Have you or a family member traveled in the last 14 days and where? yes/no: No   If the patient indicates "YES" to the above questions, their PAT will be rescheduled to limit the exposure to others and, the surgeon will be notified. THE PATIENT WILL NEED TO BE ASYMPTOMATIC FOR 14 DAYS.   If the patient is not experiencing any of these symptoms, the PAT nurse will instruct them to NOT bring anyone with them to their appointment since they may have these symptoms or traveled as well.   Please remind your patients and families that hospital visitation restrictions are in effect and the importance of the restrictions.

## 2020-05-02 ENCOUNTER — Other Ambulatory Visit: Payer: Self-pay | Admitting: Student in an Organized Health Care Education/Training Program

## 2020-05-02 ENCOUNTER — Other Ambulatory Visit (HOSPITAL_COMMUNITY)
Admission: RE | Admit: 2020-05-02 | Discharge: 2020-05-02 | Disposition: A | Discharge status: Home / Self Care | Payer: Medicare (Managed Care) | Source: Ambulatory Visit | Attending: General Surgery | Admitting: General Surgery

## 2020-05-02 DIAGNOSIS — Z01812 Encounter for preprocedural laboratory examination: Secondary | ICD-10-CM | POA: Insufficient documentation

## 2020-05-02 DIAGNOSIS — Z20822 Contact with and (suspected) exposure to covid-19: Secondary | ICD-10-CM | POA: Insufficient documentation

## 2020-05-02 LAB — SARS CORONAVIRUS 2 (TAT 6-24 HRS): SARS Coronavirus 2: NEGATIVE

## 2020-05-02 MED ORDER — OXYCODONE HCL 5 MG PO TABS: 5.0000 mg | ORAL_TABLET | Freq: Two times a day (BID) | ORAL | 0 refills | Status: DC | PRN

## 2020-05-02 NOTE — Telephone Encounter (Signed)
Need refill on pain medicine oxyCODONE (OXY IR/ROXICODONE) 5 MG immediate release tablet  ;pt contact [phone]   Pt would like more tablets, pt is having surgery on Thursday   [phone]   [id number] - [place], [place] - [address] AT [facility] & [address]

## 2020-05-02 NOTE — Telephone Encounter (Signed)
Will send in 3 days of medication (6 tab) to get through surgery.  Post op care will include pain medication.   Debe Coder, MD

## 2020-05-02 NOTE — Telephone Encounter (Signed)
Last rx written 03/28/20. Last OV 03/06/20. Next OV has not been scheduled. UDS none

## 2020-05-03 NOTE — Anesthesia Preprocedure Evaluation (Addendum)
Anesthesia Evaluation  Patient identified by MRN, date of birth, ID band Patient awake    Reviewed: Patient's Chart, lab work & pertinent test results  Airway Mallampati: II  TM Distance: >3 FB Neck ROM: Full    Dental  (+) Upper Dentures, Lower Dentures   Pulmonary neg pulmonary ROS, former smoker,    Pulmonary exam normal        Cardiovascular hypertension, + CAD   Rhythm:Regular Rate:Normal     Neuro/Psych    GI/Hepatic Neg liver ROS, Gallstones   Endo/Other  negative endocrine ROS  Renal/GU negative Renal ROS  negative genitourinary   Musculoskeletal  (+) Arthritis , Osteoarthritis,    Abdominal Normal abdominal exam  (+)   Peds  Hematology  (+) HIV,   Anesthesia Other Findings   Reproductive/Obstetrics negative OB ROS                            Anesthesia Physical Anesthesia Plan  ASA: II  Anesthesia Plan: General   Post-op Pain Management:    Induction:   PONV Risk Score and Plan:   Airway Management Planned: Oral ETT and Mask  Additional Equipment: None  Intra-op Plan:   Post-operative Plan: Extubation in OR  Informed Consent: I have reviewed the patients History and Physical, chart, labs and discussed the procedure including the risks, benefits and alternatives for the proposed anesthesia with the patient or authorized representative who has indicated his/her understanding and acceptance.       Plan Discussed with: CRNA  Anesthesia Plan Comments:         Anesthesia Quick Evaluation

## 2020-05-04 ENCOUNTER — Other Ambulatory Visit: Payer: Self-pay

## 2020-05-04 ENCOUNTER — Ambulatory Visit (HOSPITAL_COMMUNITY): Payer: Medicare (Managed Care) | Admitting: Certified Registered"

## 2020-05-04 ENCOUNTER — Ambulatory Visit (HOSPITAL_COMMUNITY): Payer: Medicare (Managed Care)

## 2020-05-04 ENCOUNTER — Encounter (HOSPITAL_COMMUNITY)
Admission: AD | Disposition: A | Discharge status: Home / Self Care | Payer: Self-pay | Source: Home / Self Care | Attending: General Surgery

## 2020-05-04 ENCOUNTER — Encounter (HOSPITAL_COMMUNITY): Payer: Self-pay | Admitting: General Surgery

## 2020-05-04 ENCOUNTER — Inpatient Hospital Stay (HOSPITAL_COMMUNITY)
Admission: AD | Admit: 2020-05-04 | Discharge: 2020-05-06 | DRG: 419 | Disposition: A | Discharge status: Home / Self Care | Payer: Medicare (Managed Care) | Attending: General Surgery | Admitting: General Surgery

## 2020-05-04 DIAGNOSIS — Z886 Allergy status to analgesic agent status: Secondary | ICD-10-CM

## 2020-05-04 DIAGNOSIS — Z885 Allergy status to narcotic agent status: Secondary | ICD-10-CM | POA: Diagnosis not present

## 2020-05-04 DIAGNOSIS — Z87891 Personal history of nicotine dependence: Secondary | ICD-10-CM

## 2020-05-04 DIAGNOSIS — H409 Unspecified glaucoma: Secondary | ICD-10-CM | POA: Diagnosis present

## 2020-05-04 DIAGNOSIS — K8020 Calculus of gallbladder without cholecystitis without obstruction: Secondary | ICD-10-CM | POA: Diagnosis present

## 2020-05-04 DIAGNOSIS — I10 Essential (primary) hypertension: Secondary | ICD-10-CM | POA: Diagnosis present

## 2020-05-04 DIAGNOSIS — F0390 Unspecified dementia without behavioral disturbance: Secondary | ICD-10-CM | POA: Diagnosis present

## 2020-05-04 DIAGNOSIS — E785 Hyperlipidemia, unspecified: Secondary | ICD-10-CM | POA: Diagnosis present

## 2020-05-04 DIAGNOSIS — K8050 Calculus of bile duct without cholangitis or cholecystitis without obstruction: Secondary | ICD-10-CM

## 2020-05-04 DIAGNOSIS — K8064 Calculus of gallbladder and bile duct with chronic cholecystitis without obstruction: Secondary | ICD-10-CM | POA: Diagnosis present

## 2020-05-04 DIAGNOSIS — M1990 Unspecified osteoarthritis, unspecified site: Secondary | ICD-10-CM | POA: Diagnosis present

## 2020-05-04 DIAGNOSIS — I2510 Atherosclerotic heart disease of native coronary artery without angina pectoris: Secondary | ICD-10-CM | POA: Diagnosis present

## 2020-05-04 DIAGNOSIS — H5461 Unqualified visual loss, right eye, normal vision left eye: Secondary | ICD-10-CM | POA: Diagnosis present

## 2020-05-04 DIAGNOSIS — Z419 Encounter for procedure for purposes other than remedying health state, unspecified: Secondary | ICD-10-CM

## 2020-05-04 DIAGNOSIS — Z20822 Contact with and (suspected) exposure to covid-19: Secondary | ICD-10-CM | POA: Diagnosis present

## 2020-05-04 DIAGNOSIS — R932 Abnormal findings on diagnostic imaging of liver and biliary tract: Secondary | ICD-10-CM | POA: Diagnosis not present

## 2020-05-04 DIAGNOSIS — K5730 Diverticulosis of large intestine without perforation or abscess without bleeding: Secondary | ICD-10-CM | POA: Diagnosis not present

## 2020-05-04 HISTORY — DX: Unspecified dementia, unspecified severity, without behavioral disturbance, psychotic disturbance, mood disturbance, and anxiety: F03.90

## 2020-05-04 HISTORY — PX: CHOLECYSTECTOMY: SHX55

## 2020-05-04 SURGERY — LAPAROSCOPIC CHOLECYSTECTOMY WITH INTRAOPERATIVE CHOLANGIOGRAM
Anesthesia: General | Site: Abdomen

## 2020-05-04 MED ORDER — LIDOCAINE 2% (20 MG/ML) 5 ML SYRINGE
INTRAMUSCULAR | Status: DC | PRN
  Administered 2020-05-04: 40 mg via INTRAVENOUS

## 2020-05-04 MED ORDER — FENTANYL CITRATE (PF) 100 MCG/2ML IJ SOLN
12.5000 ug | INTRAMUSCULAR | Status: DC | PRN
  Administered 2020-05-04: 25 ug via INTRAVENOUS
  Administered 2020-05-05 (×2): 50 ug via INTRAVENOUS
  Filled 2020-05-04: qty 2

## 2020-05-04 MED ORDER — ROCURONIUM BROMIDE 10 MG/ML (PF) SYRINGE
PREFILLED_SYRINGE | INTRAVENOUS | Status: DC | PRN
  Administered 2020-05-04: 10 mg via INTRAVENOUS
  Administered 2020-05-04: 50 mg via INTRAVENOUS

## 2020-05-04 MED ORDER — EPHEDRINE SULFATE 50 MG/ML IJ SOLN
INTRAMUSCULAR | Status: DC | PRN
  Administered 2020-05-04: 7.5 mg via INTRAVENOUS

## 2020-05-04 MED ORDER — LATANOPROST 0.005 % OP SOLN
1.0000 [drp] | Freq: Every day | OPHTHALMIC | Status: DC
  Administered 2020-05-04 – 2020-05-05 (×2): 1 [drp] via OPHTHALMIC
  Filled 2020-05-04: qty 2.5

## 2020-05-04 MED ORDER — HEMOSTATIC AGENTS (NO CHARGE) OPTIME
TOPICAL | Status: DC | PRN
  Administered 2020-05-04: 1 via TOPICAL

## 2020-05-04 MED ORDER — GABAPENTIN 100 MG PO CAPS
200.0000 mg | ORAL_CAPSULE | Freq: Every day | ORAL | Status: DC
  Filled 2020-05-04: qty 2

## 2020-05-04 MED ORDER — ACETAMINOPHEN 10 MG/ML IV SOLN: 1000.0000 mg | Freq: Once | INTRAVENOUS | Status: DC | PRN

## 2020-05-04 MED ORDER — CEFAZOLIN SODIUM-DEXTROSE 2-4 GM/100ML-% IV SOLN
2.0000 g | INTRAVENOUS | Status: AC
  Administered 2020-05-04: 2 g via INTRAVENOUS
  Filled 2020-05-04: qty 100

## 2020-05-04 MED ORDER — AMISULPRIDE (ANTIEMETIC) 5 MG/2ML IV SOLN: 10.0000 mg | Freq: Once | INTRAVENOUS | Status: DC | PRN

## 2020-05-04 MED ORDER — CHLORHEXIDINE GLUCONATE CLOTH 2 % EX PADS: 6.0000 | MEDICATED_PAD | Freq: Once | CUTANEOUS | Status: DC

## 2020-05-04 MED ORDER — ONDANSETRON HCL 4 MG/2ML IJ SOLN
4.0000 mg | Freq: Four times a day (QID) | INTRAMUSCULAR | Status: DC | PRN
  Administered 2020-05-05: 4 mg via INTRAVENOUS
  Filled 2020-05-04: qty 2

## 2020-05-04 MED ORDER — DONEPEZIL HCL 5 MG PO TABS
5.0000 mg | ORAL_TABLET | Freq: Every day | ORAL | Status: DC
  Administered 2020-05-04 – 2020-05-05 (×2): 5 mg via ORAL
  Filled 2020-05-04 (×2): qty 1

## 2020-05-04 MED ORDER — GLYCOPYRROLATE PF 0.2 MG/ML IJ SOSY
PREFILLED_SYRINGE | INTRAMUSCULAR | Status: DC | PRN
  Administered 2020-05-04: .1 mg via INTRAVENOUS

## 2020-05-04 MED ORDER — ALBUTEROL SULFATE (2.5 MG/3ML) 0.083% IN NEBU: 3.0000 mL | INHALATION_SOLUTION | Freq: Four times a day (QID) | RESPIRATORY_TRACT | Status: DC | PRN

## 2020-05-04 MED ORDER — SODIUM CHLORIDE 0.9 % IV SOLN: INTRAVENOUS | Status: DC

## 2020-05-04 MED ORDER — FENTANYL CITRATE (PF) 100 MCG/2ML IJ SOLN: 25.0000 ug | INTRAMUSCULAR | Status: DC | PRN

## 2020-05-04 MED ORDER — LISINOPRIL 20 MG PO TABS
40.0000 mg | ORAL_TABLET | Freq: Every day | ORAL | Status: DC
  Administered 2020-05-04: 40 mg via ORAL
  Filled 2020-05-04 (×2): qty 2

## 2020-05-04 MED ORDER — PROPOFOL 10 MG/ML IV BOLUS
INTRAVENOUS | Status: AC
  Filled 2020-05-04: qty 40

## 2020-05-04 MED ORDER — ACETAMINOPHEN 500 MG PO TABS
1000.0000 mg | ORAL_TABLET | ORAL | Status: AC
  Administered 2020-05-04: 1000 mg via ORAL
  Filled 2020-05-04: qty 2

## 2020-05-04 MED ORDER — PROPOFOL 10 MG/ML IV BOLUS
INTRAVENOUS | Status: DC | PRN
  Administered 2020-05-04: 130 mg via INTRAVENOUS

## 2020-05-04 MED ORDER — FENTANYL CITRATE (PF) 100 MCG/2ML IJ SOLN
INTRAMUSCULAR | Status: DC | PRN
Start: 2020-05-04 — End: 2020-05-04
  Administered 2020-05-04: 100 ug via INTRAVENOUS
  Administered 2020-05-04: 25 ug via INTRAVENOUS

## 2020-05-04 MED ORDER — BRIMONIDINE TARTRATE 0.15 % OP SOLN
1.0000 [drp] | Freq: Three times a day (TID) | OPHTHALMIC | Status: DC
  Administered 2020-05-04 – 2020-05-05 (×4): 1 [drp] via OPHTHALMIC
  Filled 2020-05-04: qty 5

## 2020-05-04 MED ORDER — ACETAMINOPHEN 160 MG/5ML PO SOLN: 325.0000 mg | Freq: Once | ORAL | Status: DC | PRN

## 2020-05-04 MED ORDER — ACETAMINOPHEN 325 MG PO TABS: 325.0000 mg | ORAL_TABLET | Freq: Once | ORAL | Status: DC | PRN

## 2020-05-04 MED ORDER — LIDOCAINE IN D5W 4-5 MG/ML-% IV SOLN
INTRAVENOUS | Status: DC | PRN
  Administered 2020-05-04: 25 ug/kg/min via INTRAVENOUS

## 2020-05-04 MED ORDER — IOHEXOL 300 MG/ML  SOLN
INTRAMUSCULAR | Status: DC | PRN
  Administered 2020-05-04: 10 mL

## 2020-05-04 MED ORDER — FENTANYL CITRATE (PF) 250 MCG/5ML IJ SOLN
INTRAMUSCULAR | Status: AC
  Filled 2020-05-04: qty 5

## 2020-05-04 MED ORDER — GABAPENTIN 300 MG PO CAPS
300.0000 mg | ORAL_CAPSULE | ORAL | Status: AC
  Administered 2020-05-04: 300 mg via ORAL
  Filled 2020-05-04: qty 3

## 2020-05-04 MED ORDER — POTASSIUM CHLORIDE ER 10 MEQ PO TBCR
10.0000 meq | EXTENDED_RELEASE_TABLET | Freq: Every day | ORAL | Status: DC
  Administered 2020-05-04: 10 meq via ORAL
  Filled 2020-05-04 (×3): qty 1

## 2020-05-04 MED ORDER — PANTOPRAZOLE SODIUM 40 MG IV SOLR
40.0000 mg | Freq: Every day | INTRAVENOUS | Status: DC
  Administered 2020-05-04 – 2020-05-05 (×2): 40 mg via INTRAVENOUS
  Filled 2020-05-04 (×2): qty 40

## 2020-05-04 MED ORDER — GABAPENTIN 400 MG PO CAPS
400.0000 mg | ORAL_CAPSULE | Freq: Every day | ORAL | Status: DC
  Administered 2020-05-04 – 2020-05-05 (×2): 400 mg via ORAL
  Filled 2020-05-04 (×2): qty 1

## 2020-05-04 MED ORDER — ONDANSETRON 4 MG PO TBDP
4.0000 mg | ORAL_TABLET | Freq: Four times a day (QID) | ORAL | Status: DC | PRN
  Filled 2020-05-04: qty 1

## 2020-05-04 MED ORDER — GABAPENTIN 100 MG PO CAPS: 200.0000 mg | ORAL_CAPSULE | Freq: Every day | ORAL | Status: DC

## 2020-05-04 MED ORDER — MEPERIDINE HCL 25 MG/ML IJ SOLN: 6.2500 mg | INTRAMUSCULAR | Status: DC | PRN

## 2020-05-04 MED ORDER — BUPIVACAINE HCL (PF) 0.25 % IJ SOLN
INTRAMUSCULAR | Status: AC
  Filled 2020-05-04: qty 30

## 2020-05-04 MED ORDER — FENTANYL CITRATE (PF) 100 MCG/2ML IJ SOLN
INTRAMUSCULAR | Status: AC
  Administered 2020-05-04: 25 ug via INTRAVENOUS
  Filled 2020-05-04: qty 2

## 2020-05-04 MED ORDER — 0.9 % SODIUM CHLORIDE (POUR BTL) OPTIME
TOPICAL | Status: DC | PRN
  Administered 2020-05-04: 1000 mL

## 2020-05-04 MED ORDER — CLONIDINE HCL 0.1 MG PO TABS
0.1000 mg | ORAL_TABLET | Freq: Two times a day (BID) | ORAL | Status: DC
  Administered 2020-05-04 – 2020-05-05 (×3): 0.1 mg via ORAL
  Filled 2020-05-04 (×5): qty 1

## 2020-05-04 MED ORDER — NITROGLYCERIN 0.4 MG SL SUBL: 0.4000 mg | SUBLINGUAL_TABLET | SUBLINGUAL | Status: DC | PRN

## 2020-05-04 MED ORDER — SODIUM CHLORIDE 0.9 % IR SOLN
Status: DC | PRN
  Administered 2020-05-04: 1000 mL

## 2020-05-04 MED ORDER — SUGAMMADEX SODIUM 200 MG/2ML IV SOLN
INTRAVENOUS | Status: DC | PRN
  Administered 2020-05-04: 140 mg via INTRAVENOUS

## 2020-05-04 MED ORDER — LACTATED RINGERS IV SOLN: INTRAVENOUS | Status: DC

## 2020-05-04 MED ORDER — LIDOCAINE HCL (CARDIAC) PF 100 MG/5ML IV SOSY
PREFILLED_SYRINGE | INTRAVENOUS | Status: DC | PRN
  Administered 2020-05-04: 40 mg via INTRAVENOUS

## 2020-05-04 MED ORDER — DEXAMETHASONE SODIUM PHOSPHATE 10 MG/ML IJ SOLN
INTRAMUSCULAR | Status: DC | PRN
  Administered 2020-05-04: 8 mg via INTRAVENOUS

## 2020-05-04 MED ORDER — ONDANSETRON HCL 4 MG/2ML IJ SOLN
INTRAMUSCULAR | Status: DC | PRN
  Administered 2020-05-04: 4 mg via INTRAVENOUS

## 2020-05-04 MED ORDER — BUPIVACAINE HCL (PF) 0.25 % IJ SOLN
INTRAMUSCULAR | Status: DC | PRN
  Administered 2020-05-04: 15 mL

## 2020-05-04 MED ORDER — CHLORHEXIDINE GLUCONATE 0.12 % MT SOLN
15.0000 mL | Freq: Once | OROMUCOSAL | Status: AC
  Administered 2020-05-04: 15 mL via OROMUCOSAL
  Filled 2020-05-04: qty 15

## 2020-05-04 MED ORDER — ORAL CARE MOUTH RINSE: 15.0000 mL | Freq: Once | OROMUCOSAL | Status: AC

## 2020-05-04 SURGICAL SUPPLY — 39 items
APPLIER CLIP 5 13 M/L LIGAMAX5 (MISCELLANEOUS) ×2
BLADE CLIPPER SURG (BLADE) IMPLANT
CANISTER SUCT 3000ML PPV (MISCELLANEOUS) ×2 IMPLANT
CATH REDDICK CHOLANGI 4FR 50CM (CATHETERS) ×2 IMPLANT
CHLORAPREP W/TINT 26 (MISCELLANEOUS) ×2 IMPLANT
CLIP APPLIE 5 13 M/L LIGAMAX5 (MISCELLANEOUS) ×1 IMPLANT
COVER MAYO STAND STRL (DRAPES) ×2 IMPLANT
COVER SURGICAL LIGHT HANDLE (MISCELLANEOUS) ×2 IMPLANT
COVER WAND RF STERILE (DRAPES) IMPLANT
DERMABOND ADVANCED (GAUZE/BANDAGES/DRESSINGS) ×1
DERMABOND ADVANCED .7 DNX12 (GAUZE/BANDAGES/DRESSINGS) ×1 IMPLANT
DRAPE C-ARM 42X120 X-RAY (DRAPES) ×2 IMPLANT
ELECT REM PT RETURN 9FT ADLT (ELECTROSURGICAL) ×2
ELECTRODE REM PT RTRN 9FT ADLT (ELECTROSURGICAL) ×1 IMPLANT
ENDOLOOP SUT PDS II  0 18 (SUTURE) ×2
ENDOLOOP SUT PDS II 0 18 (SUTURE) ×1 IMPLANT
GLOVE BIO SURGEON STRL SZ7.5 (GLOVE) ×2 IMPLANT
GOWN STRL REUS W/ TWL LRG LVL3 (GOWN DISPOSABLE) ×3 IMPLANT
GOWN STRL REUS W/TWL LRG LVL3 (GOWN DISPOSABLE) ×6
HEMOSTAT SNOW SURGICEL 2X4 (HEMOSTASIS) ×2 IMPLANT
IV CATH 14GX2 1/4 (CATHETERS) ×2 IMPLANT
IV NS 1000ML (IV SOLUTION) ×2
IV NS 1000ML BAXH (IV SOLUTION) ×1 IMPLANT
KIT BASIN OR (CUSTOM PROCEDURE TRAY) ×2 IMPLANT
KIT TURNOVER KIT B (KITS) ×2 IMPLANT
NS IRRIG 1000ML POUR BTL (IV SOLUTION) ×2 IMPLANT
PAD ARMBOARD 7.5X6 YLW CONV (MISCELLANEOUS) ×2 IMPLANT
POUCH SPECIMEN RETRIEVAL 10MM (ENDOMECHANICALS) ×2 IMPLANT
SCISSORS LAP 5X35 DISP (ENDOMECHANICALS) ×2 IMPLANT
SET IRRIG TUBING LAPAROSCOPIC (IRRIGATION / IRRIGATOR) ×2 IMPLANT
SET TUBE SMOKE EVAC HIGH FLOW (TUBING) ×2 IMPLANT
SLEEVE ENDOPATH XCEL 5M (ENDOMECHANICALS) ×4 IMPLANT
SPECIMEN JAR SMALL (MISCELLANEOUS) ×2 IMPLANT
SUT MNCRL AB 4-0 PS2 18 (SUTURE) ×2 IMPLANT
TOWEL GREEN STERILE FF (TOWEL DISPOSABLE) ×2 IMPLANT
TRAY LAPAROSCOPIC MC (CUSTOM PROCEDURE TRAY) ×2 IMPLANT
TROCAR XCEL BLUNT TIP 100MML (ENDOMECHANICALS) ×2 IMPLANT
TROCAR XCEL NON-BLD 5MMX100MML (ENDOMECHANICALS) ×2 IMPLANT
WATER STERILE IRR 1000ML POUR (IV SOLUTION) ×2 IMPLANT

## 2020-05-04 NOTE — Interval H&P Note (Signed)
History and Physical Interval Note:  05/04/2020 8:17 AM  [name]  has presented today for surgery, with the diagnosis of GALLSTONES.  The various methods of treatment have been discussed with the patient and family. After consideration of risks, benefits and other options for treatment, the patient has consented to  Procedure(s): LAPAROSCOPIC CHOLECYSTECTOMY WITH INTRAOPERATIVE CHOLANGIOGRAM (N/A) as a surgical intervention.  The patient's history has been reviewed, patient examined, no change in status, stable for surgery.  I have reviewed the patient's chart and labs.  Questions were answered to the patient's satisfaction.     Chevis Pretty III

## 2020-05-04 NOTE — Anesthesia Postprocedure Evaluation (Signed)
Anesthesia Post Note  Patient: Denyla Cortese  Procedure(s) Performed: LAPAROSCOPIC CHOLECYSTECTOMY WITH INTRAOPERATIVE CHOLANGIOGRAM (N/A Abdomen)     Patient location during evaluation: PACU Anesthesia Type: General Level of consciousness: awake and alert Pain management: pain level controlled Vital Signs Assessment: post-procedure vital signs reviewed and stable Respiratory status: spontaneous breathing, nonlabored ventilation, respiratory function stable and patient connected to nasal cannula oxygen Cardiovascular status: blood pressure returned to baseline and stable Postop Assessment: no apparent nausea or vomiting Anesthetic complications: no   No complications documented.  Last Vitals:  Vitals:   05/04/20 1130 05/04/20 1156  BP: 128/61 (!) 159/70  Pulse: 68 61  Resp: 16 18  Temp:    SpO2: 94% 95%    Last Pain:  Vitals:   05/04/20 1115  TempSrc:   PainSc: 3                  Shelton Silvas

## 2020-05-04 NOTE — Consult Note (Signed)
Reason for Consult: Choledocholithiasis Referring Physician: CCS  Daneil Dolin HPI: This is an 84 year old female with a PMH of HTN, hyperlipidemia, glaucoma, dementia, and CAD admitted for symptomatic gallstones.  It is reported that she experiences epigastric pain intermittently for the past several months.  Work up in the ER showed that she had gallstones, but no evidence of biliary ductal dilation.  A lap chole was performed today without any immediate complications and the IOC was positive for two distal filling defects.  Past Medical History:  Diagnosis Date  . Arthritis   . 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 Left eye sees shadows and is bothered by bright lights. She wears specialty sunglasses which help her with sensitivity.   [name] CAD (coronary artery disease)    nonobstructive, myoview normal 03/2007, done by Dr. Daleen Squibb - EF = 75%  . Glaucoma   . HLD (hyperlipidemia)   . Hypertension     Past Surgical History:  Procedure Laterality Date  . CARDIAC CATHETERIZATION    . DILATION AND CURETTAGE OF UTERUS    . EYE SURGERY    . OCULAR PROSTHESIS REMOVAL     Left eye   . TONSILLECTOMY      Family History  Problem Relation Age of Onset  . Stroke Neg Hx   . Cancer Neg Hx     Social History:  reports that she has quit smoking. Her smoking use included cigarettes. She has never used smokeless tobacco. She reports that she does not drink alcohol and does not use drugs.  Allergies:  Allergies  Allergen Reactions  . Aspirin     REACTION: gastritis with GERD  . Codeine Nausea Only    Medications:  Scheduled: . brimonidine  1 drop Both Eyes TID  . cloNIDine  0.1 mg Oral BID  . donepezil  5 mg Oral QHS  . [START ON 05/05/2020] gabapentin  200 mg Oral Daily  . gabapentin  400 mg Oral QHS  . latanoprost  1 drop Both Eyes QHS  . lisinopril  40 mg Oral  Daily  . pantoprazole (PROTONIX) IV  40 mg Intravenous QHS  . potassium chloride  10 mEq Oral Daily   Continuous: . sodium chloride      No results found for this or any previous visit (from the past 24 hour(s)).   DG Cholangiogram Operative  Result Date: 05/04/2020 CLINICAL DATA:  84 year old with a history of cholelithiasis EXAM: INTRAOPERATIVE CHOLANGIOGRAM TECHNIQUE: Cholangiographic images from the C-arm fluoroscopic device were submitted for interpretation post-operatively. Please see the procedural report for the amount of contrast and the fluoroscopy time utilized. COMPARISON:  Ultrasound 03/02/2020 FINDINGS: Surgical instruments project over the upper abdomen. There is cannulation of the cystic duct/gallbladder neck, with antegrade infusion of contrast. Caliber of the extrahepatic ductal system within normal limits. Vague filling defects at the distal common bile duct above the ampulla. Free flow of contrast across the ampulla. IMPRESSION: Intraoperative cholangiogram demonstrates extrahepatic biliary ducts of unremarkable caliber, with vague filling defects above the ampulla at the distal common bile duct, potentially retained debris/stones. Contrast does traverse this region and passed through the ampulla into the duodenum. Correlation with MRCP/ERCP may be considered. Please refer to the dictated operative report for full details of intraoperative findings and procedure Electronically Signed   By: Gilmer Mor D.O.   On: 05/04/2020 14:07  ROS:  As stated above in the HPI otherwise negative.  Blood pressure (!) 159/70, pulse 61, temperature 98.3 F (36.8 C), resp. rate 18, height 5\' 5"  (1.651 m), weight 67.9 kg, SpO2 95 %.    PE: Gen: NAD, Alert and Oriented HEENT:  [place]/AT, EOMI Neck: Supple, no LAD Lungs: CTA Bilaterally CV: RRR without M/G/R ABD: Soft, NTND, +BS Ext: No C/C/E  Assessment/Plan: 1) Choledocholithiasis. 2) Cholelithiasis s/p lap chole.   The IOC images  were reviewed and there appears to be two distal CBD filling defects.  Her admission liver enzymes were not obtained.  Further evaluation and treatment will be pursued with an ERCP.  I discussed the main risks of bleeding, perforation, and pancreatitis with her daughter.  Her daughter acknowledges these risks and other unforseen complications and wishes to proceed.  Plan: 1) ERCP tomorrow.  Sabrina Arriaga D 05/04/2020, 3:49 PM

## 2020-05-04 NOTE — H&P (View-Only) (Signed)
Reason for Consult: Choledocholithiasis Referring Physician: CCS  Daneil Dolin HPI: This is an 84 year old female with a PMH of HTN, hyperlipidemia, glaucoma, dementia, and CAD admitted for symptomatic gallstones.  It is reported that she experiences epigastric pain intermittently for the past several months.  Work up in the ER showed that she had gallstones, but no evidence of biliary ductal dilation.  A lap chole was performed today without any immediate complications and the IOC was positive for two distal filling defects.  Past Medical History:  Diagnosis Date  . Arthritis   . 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 Left eye sees shadows and is bothered by bright lights. She wears specialty sunglasses which help her with sensitivity.   [name] CAD (coronary artery disease)    nonobstructive, myoview normal 03/2007, done by Dr. Daleen Squibb - EF = 75%  . Glaucoma   . HLD (hyperlipidemia)   . Hypertension     Past Surgical History:  Procedure Laterality Date  . CARDIAC CATHETERIZATION    . DILATION AND CURETTAGE OF UTERUS    . EYE SURGERY    . OCULAR PROSTHESIS REMOVAL     Left eye   . TONSILLECTOMY      Family History  Problem Relation Age of Onset  . Stroke Neg Hx   . Cancer Neg Hx     Social History:  reports that she has quit smoking. Her smoking use included cigarettes. She has never used smokeless tobacco. She reports that she does not drink alcohol and does not use drugs.  Allergies:  Allergies  Allergen Reactions  . Aspirin     REACTION: gastritis with GERD  . Codeine Nausea Only    Medications:  Scheduled: . brimonidine  1 drop Both Eyes TID  . cloNIDine  0.1 mg Oral BID  . donepezil  5 mg Oral QHS  . [START ON 05/05/2020] gabapentin  200 mg Oral Daily  . gabapentin  400 mg Oral QHS  . latanoprost  1 drop Both Eyes QHS  . lisinopril  40 mg Oral  Daily  . pantoprazole (PROTONIX) IV  40 mg Intravenous QHS  . potassium chloride  10 mEq Oral Daily   Continuous: . sodium chloride      No results found for this or any previous visit (from the past 24 hour(s)).   DG Cholangiogram Operative  Result Date: 05/04/2020 CLINICAL DATA:  84 year old with a history of cholelithiasis EXAM: INTRAOPERATIVE CHOLANGIOGRAM TECHNIQUE: Cholangiographic images from the C-arm fluoroscopic device were submitted for interpretation post-operatively. Please see the procedural report for the amount of contrast and the fluoroscopy time utilized. COMPARISON:  Ultrasound 03/02/2020 FINDINGS: Surgical instruments project over the upper abdomen. There is cannulation of the cystic duct/gallbladder neck, with antegrade infusion of contrast. Caliber of the extrahepatic ductal system within normal limits. Vague filling defects at the distal common bile duct above the ampulla. Free flow of contrast across the ampulla. IMPRESSION: Intraoperative cholangiogram demonstrates extrahepatic biliary ducts of unremarkable caliber, with vague filling defects above the ampulla at the distal common bile duct, potentially retained debris/stones. Contrast does traverse this region and passed through the ampulla into the duodenum. Correlation with MRCP/ERCP may be considered. Please refer to the dictated operative report for full details of intraoperative findings and procedure Electronically Signed   By: Gilmer Mor D.O.   On: 05/04/2020 14:07  ROS:  As stated above in the HPI otherwise negative.  Blood pressure (!) 159/70, pulse 61, temperature 98.3 F (36.8 C), resp. rate 18, height 5' 5" (1.651 m), weight 67.9 kg, SpO2 95 %.    PE: Gen: NAD, Alert and Oriented HEENT:  [place]/AT, EOMI Neck: Supple, no LAD Lungs: CTA Bilaterally CV: RRR without M/G/R ABD: Soft, NTND, +BS Ext: No C/C/E  Assessment/Plan: 1) Choledocholithiasis. 2) Cholelithiasis s/p lap chole.   The IOC images  were reviewed and there appears to be two distal CBD filling defects.  Her admission liver enzymes were not obtained.  Further evaluation and treatment will be pursued with an ERCP.  I discussed the main risks of bleeding, perforation, and pancreatitis with her daughter.  Her daughter acknowledges these risks and other unforseen complications and wishes to proceed.  Plan: 1) ERCP tomorrow.  Stanislaus Kaltenbach D 05/04/2020, 3:49 PM     

## 2020-05-04 NOTE — Anesthesia Procedure Notes (Signed)
Procedure Name: Intubation Date/Time: 05/04/2020 9:09 AM Performed by: Cleda Daub, CRNA Pre-anesthesia Checklist: Patient identified Patient Re-evaluated:Patient Re-evaluated prior to induction Oxygen Delivery Method: Circle system utilized Preoxygenation: Pre-oxygenation with 100% oxygen Induction Type: IV induction Ventilation: Mask ventilation without difficulty Laryngoscope Size: Mac and 3 Grade View: Grade I Tube type: Oral Tube size: 7.0 mm Number of attempts: 1 Airway Equipment and Method: Stylet and Oral airway Placement Confirmation: ETT inserted through vocal cords under direct vision,  positive ETCO2 and breath sounds checked- equal and bilateral Secured at: 21 cm Tube secured with: Tape Dental Injury: Teeth and Oropharynx as per pre-operative assessment

## 2020-05-04 NOTE — Transfer of Care (Signed)
Immediate Anesthesia Transfer of Care Note  Patient: [name]  Procedure(s) Performed: LAPAROSCOPIC CHOLECYSTECTOMY WITH INTRAOPERATIVE CHOLANGIOGRAM (N/A Abdomen)  Patient Location: PACU  Anesthesia Type:General  Level of Consciousness: drowsy and patient cooperative  Airway & Oxygen Therapy: Patient Spontanous Breathing and Patient connected to face mask  Post-op Assessment: Report given to RN and Post -op Vital signs reviewed and stable  Post vital signs: Reviewed and stable  Last Vitals:  Vitals Value Taken Time  BP 162/68 05/04/20 1045  Temp    Pulse 73 05/04/20 1048  Resp 19 05/04/20 1048  SpO2 100 % 05/04/20 1048  Vitals shown include unvalidated device data.  Last Pain:  Vitals:   05/04/20 0735  TempSrc:   PainSc: 0-No pain      Patients Stated Pain Goal: 3 (05/04/20 0735)  Complications: No complications documented.

## 2020-05-04 NOTE — Op Note (Signed)
05/04/2020  10:19 AM  PATIENT:  [name]  84 y.o. female  PRE-OPERATIVE DIAGNOSIS:  GALLSTONES  POST-OPERATIVE DIAGNOSIS:  GALLSTONES with cbd stones  PROCEDURE:  Procedure(s): LAPAROSCOPIC CHOLECYSTECTOMY WITH INTRAOPERATIVE CHOLANGIOGRAM (N/A)  SURGEON:  Surgeon(s) and Role:    * Griselda Miner, MD - Primary    * Fritzi Mandes, MD - Assisting  PHYSICIAN ASSISTANT:   ASSISTANTS: Dr. Freida Busman   ANESTHESIA:   local and general  EBL:  minimal   BLOOD ADMINISTERED:none  DRAINS: none   LOCAL MEDICATIONS USED:  MARCAINE     SPECIMEN:  Source of Specimen:  gallbladder  DISPOSITION OF SPECIMEN:  PATHOLOGY  COUNTS:  YES  TOURNIQUET:  * No tourniquets in log *  DICTATION: .Dragon Dictation     Procedure: After informed consent was obtained the patient was brought to the operating room and placed in the supine position on the operating room table. After adequate induction of general anesthesia the patient's abdomen was prepped with ChloraPrep allowed to dry and draped in usual sterile manner. An appropriate timeout was performed. The area below the umbilicus was infiltrated with quarter percent  Marcaine. A small incision was made with a 15 blade knife. The incision was carried down through the subcutaneous tissue bluntly with a hemostat and [employer] retractors. The linea alba was identified. The linea alba was incised with a 15 blade knife and each side was grasped with Coker clamps. The preperitoneal space was then probed with a hemostat until the peritoneum was opened and access was gained to the abdominal cavity. A 0 Vicryl pursestring stitch was placed in the fascia surrounding the opening. A Hassan cannula was then placed through the opening and anchored in place with the previously placed Vicryl purse string stitch. The abdomen was insufflated with carbon dioxide without difficulty. A laparoscope was inserted through the [facility] cannula in the right upper quadrant was  inspected. Next the epigastric region was infiltrated with % Marcaine. A small incision was made with a 15 blade knife. A 5 mm port was placed bluntly through this incision into the abdominal cavity under direct vision. Next 2 sites were chosen laterally on the right side of the abdomen for placement of 5 mm ports. Each of these areas was infiltrated with quarter percent Marcaine. Small stab incisions were made with a 15 blade knife. 5 mm ports were then placed bluntly through these incisions into the abdominal cavity under direct vision without difficulty. A blunt grasper was placed through the lateralmost 5 mm port and used to grasp the dome of the gallbladder and elevated anteriorly and superiorly. Another blunt grasper was placed through the other 5 mm port and used to retract the body and neck of the gallbladder. A dissector was placed through the epigastric port and using the electrocautery the peritoneal reflection at the gallbladder neck was opened. Blunt dissection was then carried out in this area until the gallbladder neck-cystic duct junction was readily identified and a good window was created. A single clip was placed on the gallbladder neck. A small  ductotomy was made just below the clip with laparoscopic scissors. A 14-gauge Angiocath was then placed through the anterior abdominal wall under direct vision. A Reddick cholangiogram catheter was then placed through the Angiocath and flushed. The catheter was then placed in the cystic duct and anchored in place with a clip. A cholangiogram was obtained that showed a filling defect in the distal common bile duct,  good emptying into the duodenum an  adequate length on the cystic duct. The anchoring clip and catheters were then removed from the patient. 3 clips were placed proximally on the cystic duct and the duct was divided between the 2 sets of clips. The gallbladder and cystic duct were chronically thickened so I also controlled the cystic duct stump  with a 0 PDS endoloop. Posterior to this the cystic artery was identified and again dissected bluntly in a circumferential manner until a good window  was created. 2 clips were placed proximally and one distally on the artery and the artery was divided between the 2 sets of clips. Next a laparoscopic hook cautery device was used to separate the gallbladder from the liver bed. Prior to completely detaching the gallbladder from the liver bed the liver bed was inspected and several small bleeding points were coagulated with the electrocautery until the area was completely hemostatic. The gallbladder was then detached the rest of it from the liver bed without difficulty. I also placed a small piece of surgicel snow in the gallbladder bed. A laparoscopic bag was inserted through the hassan port. The laparoscope was moved to the epigastric port. The gallbladder was placed within the bag and the bag was sealed.  The bag with the gallbladder was then removed with the [facility] cannula through the infraumbilical port without difficulty. The fascial defect was then closed with the previously placed Vicryl pursestring stitch as well as with another figure-of-eight 0 Vicryl stitch. The liver bed was inspected again and found to be hemostatic. The abdomen was irrigated with copious amounts of saline until the effluent was clear. The ports were then removed under direct vision without difficulty and were found to be hemostatic. The gas was allowed to escape. The skin incisions were all closed with interrupted 4-0 Monocryl subcuticular stitches. Dermabond dressings were applied. The patient tolerated the procedure well. At the end of the case all needle sponge and instrument counts were correct. The patient was then awakened and taken to recovery in stable condition  PLAN OF CARE: Admit for overnight observation  PATIENT DISPOSITION:  PACU - hemodynamically stable.   Delay start of Pharmacological VTE agent (>24hrs) due to  surgical blood loss or risk of bleeding: no

## 2020-05-04 NOTE — H&P (Signed)
[name]  Location: [facility] Patient #: [id number] DOB: 1936-05-25 Unknown / Language: [place] / Race: Black or African American Female   History of Present Illness The patient is a 84 year old female who presents with abdominal pain. We are asked to see the patient in consultation by Dr. Oswaldo Done to evaluate her for gallstones. The patient is a 84 year old black female who has been experiencing epigastric and right upper quadrant pain every few weeks for the last several months. She describes the pain as mild. She has had some nausea but no vomiting. She denies any fevers or chills. She states that she has never had surgery on her belly before. She did have an ultrasound which showed stones in the gallbladder but no gallbladder wall thickening or ductal dilatation. Her liver functions were normal. She does have difficulty seeing   Allergies  Aspirin *ANALGESICS - NonNarcotic*  Allergies Reconciled  Codeine Phosphate *ANALGESICS - OPIOID*   Medication History  Albuterol (90MCG/ACT Aerosol Soln, Inhalation) Active. cloNIDine HCl (0.1MG  Tablet, Oral) Active. Cyclobenzaprine HCl (5MG  Tablet, Oral) Active. Donepezil HCl (5MG  Tablet, Oral) Active. Gabapentin (100MG  Capsule, Oral) Active. Lisinopril (40MG  Tablet, Oral) Active. Nitroglycerin (0.4MG  Tab Sublingual, Sublingual) Active. Potassium Chloride ER ( Tablet ER, Oral) Active. Pravastatin Sodium (20MG  Tablet, Oral) Active. Medications Reconciled    Review of Systems  General Not Present- Appetite Loss, Chills, Fatigue, Fever, Night Sweats, Weight Gain and Weight Loss. Note: All other systems negative (unless as noted in HPI & included Review of Systems) Skin Not Present- Change in Wart/Mole, Dryness, Hives, Jaundice, New Lesions, Non-Healing Wounds, Rash and Ulcer. HEENT Not Present- Earache, Hearing Loss, Hoarseness, Nose Bleed, Oral Ulcers, Ringing in the Ears, Seasonal Allergies,  Sinus Pain, Sore Throat, Visual Disturbances, Wears glasses/contact lenses and Yellow Eyes. Respiratory Not Present- Bloody sputum, Chronic Cough, Difficulty Breathing, Snoring and Wheezing. Breast Not Present- Breast Mass, Breast Pain, Nipple Discharge and Skin Changes. Cardiovascular Not Present- Chest Pain, Difficulty Breathing Lying Down, Leg Cramps, Palpitations, Rapid Heart Rate, Shortness of Breath and Swelling of Extremities. Gastrointestinal Not Present- Abdominal Pain, Bloating, Bloody Stool, Change in Bowel Habits, Chronic diarrhea, Constipation, Difficulty Swallowing, Excessive gas, Gets full quickly at meals, Hemorrhoids, Indigestion, Nausea, Rectal Pain and Vomiting. Female Genitourinary Not Present- Frequency, Nocturia, Painful Urination, Pelvic Pain and Urgency. Musculoskeletal Not Present- Back Pain, Joint Pain, Joint Stiffness, Muscle Pain, Muscle Weakness and Swelling of Extremities. Neurological Not Present- Decreased Memory, Fainting, Headaches, Numbness, Seizures, Tingling, Tremor, Trouble walking and Weakness. Psychiatric Not Present- Anxiety, Bipolar, Change in Sleep Pattern, Depression, Fearful and Frequent crying. Endocrine Not Present- Cold Intolerance, Excessive Hunger, Hair Changes, Heat Intolerance, Hot flashes and New Diabetes. Hematology Not Present- Easy Bruising, Excessive bleeding, Gland problems, HIV and Persistent Infections.  Vitals  Weight: 147.13 lb Height: 65in Body Surface Area: 1.74 m Body Mass Index: 24.48 kg/m  Temp.: 97.31F  Pulse: 74 (Regular)        Physical Exam General Mental Status-Alert. General Appearance-Consistent with stated age. Hydration-Well hydrated. Voice-Normal.  Head and Neck Head-normocephalic, atraumatic with no lesions or palpable masses. Trachea-midline. Thyroid Gland Characteristics - normal size and consistency.  Eye Eyeball - Bilateral-Extraocular movements intact. Sclera/Conjunctiva  - Bilateral-No scleral icterus.  Chest and Lung Exam Chest and lung exam reveals -quiet, even and easy respiratory effort with no use of accessory muscles and on auscultation, normal breath sounds, no adventitious sounds and normal vocal resonance. Inspection Chest Wall - Normal. Back - normal.  Cardiovascular Cardiovascular examination reveals -normal heart  sounds, regular rate and rhythm with no murmurs and normal pedal pulses bilaterally.  Abdomen Note: The abdomen is soft with mild right upper quadrant tenderness. There is no palpable mass. There is no obvious hernia.   Neurologic Neurologic evaluation reveals -alert and oriented x 3 with no impairment of recent or remote memory. Mental Status-Normal.  Musculoskeletal Normal Exam - Left-Upper Extremity Strength Normal and Lower Extremity Strength Normal. Normal Exam - Right-Upper Extremity Strength Normal and Lower Extremity Strength Normal.  Lymphatic Head & Neck  General Head & Neck Lymphatics: Bilateral - Description - Normal. Axillary  General Axillary Region: Bilateral - Description - Normal. Tenderness - Non Tender. Femoral & Inguinal  Generalized Femoral & Inguinal Lymphatics: Bilateral - Description - Normal. Tenderness - Non Tender.    Assessment & Plan GALLSTONES (K80.20) Impression: The patient appears to have symptomatic gallstones. Because of the risk of further painful episodes and possible pancreatitis and think she would benefit from having her gallbladder removed. She would also like to have this done. I have discussed with her in detail the risks and benefits of the operation as well as some of the technical aspects including the risk of common duct injury and she understands and wishes to proceed. I will plan for a laparoscopic cholecystectomy with intraoperative cholangiogram. This patient encounter took 30 minutes today to perform the following: take history, perform exam, review outside  records, interpret imaging, counsel the patient on their diagnosis and document encounter, findings & plan in the EHR

## 2020-05-05 ENCOUNTER — Inpatient Hospital Stay (HOSPITAL_COMMUNITY): Payer: Medicare (Managed Care) | Admitting: Certified Registered Nurse Anesthetist

## 2020-05-05 ENCOUNTER — Encounter (HOSPITAL_COMMUNITY): Payer: Self-pay | Admitting: General Surgery

## 2020-05-05 ENCOUNTER — Inpatient Hospital Stay (HOSPITAL_COMMUNITY): Payer: Medicare (Managed Care)

## 2020-05-05 ENCOUNTER — Encounter (HOSPITAL_COMMUNITY)
Admission: AD | Disposition: A | Discharge status: Home / Self Care | Payer: Self-pay | Source: Home / Self Care | Attending: General Surgery

## 2020-05-05 HISTORY — PX: SPHINCTEROTOMY: SHX5544

## 2020-05-05 HISTORY — PX: ERCP: SHX5425

## 2020-05-05 HISTORY — PX: REMOVAL OF STONES: SHX5545

## 2020-05-05 LAB — SURGICAL PATHOLOGY

## 2020-05-05 SURGERY — ERCP, WITH INTERVENTION IF INDICATED
Anesthesia: General

## 2020-05-05 MED ORDER — OXYCODONE HCL 5 MG PO TABS: 5.0000 mg | ORAL_TABLET | Freq: Four times a day (QID) | ORAL | 0 refills | Status: DC | PRN

## 2020-05-05 MED ORDER — SUCCINYLCHOLINE CHLORIDE 20 MG/ML IJ SOLN
INTRAMUSCULAR | Status: DC | PRN
  Administered 2020-05-05: 80 mg via INTRAVENOUS

## 2020-05-05 MED ORDER — PROPOFOL 10 MG/ML IV BOLUS
INTRAVENOUS | Status: DC | PRN
  Administered 2020-05-05: 50 mg via INTRAVENOUS

## 2020-05-05 MED ORDER — CIPROFLOXACIN IN D5W 400 MG/200ML IV SOLN
INTRAVENOUS | Status: DC | PRN
  Administered 2020-05-05: 400 mg via INTRAVENOUS

## 2020-05-05 MED ORDER — INDOMETHACIN 50 MG RE SUPP
RECTAL | Status: AC
  Filled 2020-05-05: qty 2

## 2020-05-05 MED ORDER — LACTATED RINGERS IV SOLN: INTRAVENOUS | Status: DC | PRN

## 2020-05-05 MED ORDER — GLUCAGON HCL RDNA (DIAGNOSTIC) 1 MG IJ SOLR
INTRAMUSCULAR | Status: AC
  Filled 2020-05-05: qty 1

## 2020-05-05 MED ORDER — ONDANSETRON HCL 4 MG/2ML IJ SOLN
INTRAMUSCULAR | Status: DC | PRN
  Administered 2020-05-05: 4 mg via INTRAVENOUS

## 2020-05-05 MED ORDER — LIDOCAINE HCL (CARDIAC) PF 100 MG/5ML IV SOSY
PREFILLED_SYRINGE | INTRAVENOUS | Status: DC | PRN
  Administered 2020-05-05: 40 mg via INTRATRACHEAL

## 2020-05-05 MED ORDER — SODIUM CHLORIDE 0.9 % IV SOLN
INTRAVENOUS | Status: DC | PRN
  Administered 2020-05-05: 15 mL

## 2020-05-05 MED ORDER — CIPROFLOXACIN IN D5W 400 MG/200ML IV SOLN
INTRAVENOUS | Status: AC
  Filled 2020-05-05: qty 200

## 2020-05-05 MED ORDER — ACETAMINOPHEN 325 MG PO TABS
650.0000 mg | ORAL_TABLET | Freq: Four times a day (QID) | ORAL | Status: DC | PRN
  Administered 2020-05-05 – 2020-05-06 (×2): 650 mg via ORAL
  Filled 2020-05-05: qty 2

## 2020-05-05 MED ORDER — INDOMETHACIN 50 MG RE SUPP
RECTAL | Status: DC | PRN
  Administered 2020-05-05: 100 mg via RECTAL

## 2020-05-05 NOTE — Op Note (Signed)
[facility] Patient Name: [name] Procedure Date : 05/05/2020 MRN: [id number] Attending MD: Jeani Hawking , MD Date of Birth: 07-Mar-1936 CSN: [id number] Age: 84 Admit Type: Inpatient Procedure:                ERCP Indications:              Common bile duct stone(s) Providers:                Jeani Hawking, MD, Glory Rosebush, RN, Faustina                            Mbumina, Technician, Alison Murray, RN Referring MD:              Medicines:                General Anesthesia Complications:            No immediate complications. Estimated Blood Loss:     Estimated blood loss: none. Procedure:                Pre-Anesthesia Assessment:                           - Prior to the procedure, a History and Physical                            was performed, and patient medications and                            allergies were reviewed. The patient's tolerance of                            previous anesthesia was also reviewed. The risks                            and benefits of the procedure and the sedation                            options and risks were discussed with the patient.                            All questions were answered, and informed consent                            was obtained. Prior Anticoagulants: The patient has                            taken no previous anticoagulant or antiplatelet                            agents. ASA Grade Assessment: III - A patient with                            severe systemic disease. After reviewing the risks  and benefits, the patient was deemed in                            satisfactory condition to undergo the procedure.                           - Sedation was administered by an anesthesia                            professional. General anesthesia was attained.                           After obtaining informed consent, the scope was                            passed under direct vision.  Throughout the                            procedure, the patient's blood pressure, pulse, and                            oxygen saturations were monitored continuously. The                            [id number]-Q180V ([id number]) Olympus Duodensocope was                            introduced through the mouth, and used to inject                            contrast into and used to inject contrast into the                            bile duct. The ERCP was accomplished without                            difficulty. The patient tolerated the procedure                            well. Scope In: Scope Out: Findings:      The major papilla was normal. A short 0.035 inch Soft Jagwire was passed       into the biliary tree. A 10 mm biliary sphincterotomy was made with a       monofilament traction (standard) sphincterotome using ERBE       electrocautery. There was no post-sphincterotomy bleeding. The biliary       tree was swept with a 12 mm balloon starting at the bifurcation. Five       small pigmented stones were removed. No stones remained.      Cannulation of the CBD was achieved during the first attempt. The       guidewire was secured in the right intrahepatic ducts. Contrast       injection revealed a CBD of approximatly 7-8 mm. In the distal CBD there       were some lucencies. A 1 cm  sphincterotomy was created and the distal       CBD was swept first. A total of 4-5 black pigmented stones were removed.       The CBD was swept 4 more times and then a final occlusion cholangiogram       was performed. No stones were retained. There was no extravasation of       contrast from the cystic duct or any accessory ducts. Impression:               - The major papilla appeared normal.                           - Choledocholithiasis was found. Complete removal                            was accomplished by biliary sphincterotomy and                            balloon extraction.                            - A biliary sphincterotomy was performed.                           - The biliary tree was swept. Recommendation:           - Return patient to hospital ward for ongoing care.                           - Resume regular diet.                           - Follow up PRN. Procedure Code(s):        --- Professional ---                           [phone], Endoscopic retrograde                            cholangiopancreatography (ERCP); with removal of                            calculi/debris from biliary/pancreatic duct(s)                           43262, Endoscopic retrograde                            cholangiopancreatography (ERCP); with                            sphincterotomy/papillotomy Diagnosis Code(s):        --- Professional ---                           K80.50, Calculus of bile duct without cholangitis                            or  cholecystitis without obstruction CPT copyright 2019 American [employer] Association. All rights reserved. The codes documented in this report are preliminary and upon coder review may  be revised to meet current compliance requirements. Jeani HawkingPatrick Gearline Spilman, MD Jeani HawkingPatrick Dennys Guin, MD 05/05/2020 3:30:25 PM This report has been signed electronically. Number of Addenda: 0

## 2020-05-05 NOTE — Progress Notes (Signed)
1 Day Post-Op   Subjective/Chief Complaint: No complaints   Objective: Vital signs in last 24 hours: Temp:  [97.5 F (36.4 C)-98.3 F (36.8 C)] 97.7 F (36.5 C) (09/10 0731) Pulse Rate:  [61-80] 63 (09/10 0731) Resp:  [12-28] 16 (09/10 0731) BP: (128-185)/(57-86) 140/76 (09/10 0731) SpO2:  [94 %-100 %] 94 % (09/10 0731) Last BM Date: 05/03/20  Intake/Output from previous day: 09/09 0701 - 09/10 0700 In: 1000 [I.V.:1000] Out: 205 [Urine:200; Blood:5] Intake/Output this shift: No intake/output data recorded.  General appearance: alert and cooperative Resp: clear to auscultation bilaterally Cardio: regular rate and rhythm GI: soft, minimal tenderness  Lab Results:  No results for input(s): WBC, HGB, HCT, PLT in the last 72 hours. BMET No results for input(s): NA, K, CL, CO2, GLUCOSE, BUN, CREATININE, CALCIUM in the last 72 hours. PT/INR No results for input(s): LABPROT, INR in the last 72 hours. ABG No results for input(s): PHART, HCO3 in the last 72 hours.  Invalid input(s): PCO2, PO2  Studies/Results: DG Cholangiogram Operative  Result Date: 05/04/2020 CLINICAL DATA:  84 year old with a history of cholelithiasis EXAM: INTRAOPERATIVE CHOLANGIOGRAM TECHNIQUE: Cholangiographic images from the C-arm fluoroscopic device were submitted for interpretation post-operatively. Please see the procedural report for the amount of contrast and the fluoroscopy time utilized. COMPARISON:  Ultrasound 03/02/2020 FINDINGS: Surgical instruments project over the upper abdomen. There is cannulation of the cystic duct/gallbladder neck, with antegrade infusion of contrast. Caliber of the extrahepatic ductal system within normal limits. Vague filling defects at the distal common bile duct above the ampulla. Free flow of contrast across the ampulla. IMPRESSION: Intraoperative cholangiogram demonstrates extrahepatic biliary ducts of unremarkable caliber, with vague filling defects above the ampulla at  the distal common bile duct, potentially retained debris/stones. Contrast does traverse this region and passed through the ampulla into the duodenum. Correlation with MRCP/ERCP may be considered. Please refer to the dictated operative report for full details of intraoperative findings and procedure Electronically Signed   By: Gilmer Mor D.O.   On: 05/04/2020 14:07    Anti-infectives: Anti-infectives (From admission, onward)   Start     Dose/Rate Route Frequency Ordered Stop   05/04/20 0715  ceFAZolin (ANCEF) IVPB 2g/100 mL premix        2 g 200 mL/hr over 30 Minutes Intravenous On call to O.R. 05/04/20 0710 05/04/20 0908      Assessment/Plan: s/p Procedure(s): LAPAROSCOPIC CHOLECYSTECTOMY WITH INTRAOPERATIVE CHOLANGIOGRAM (N/A) ERCP today  Hopefully home tomorrow  LOS: 1 day    [name] III 05/05/2020

## 2020-05-05 NOTE — Anesthesia Preprocedure Evaluation (Addendum)
Anesthesia Evaluation  Patient identified by MRN, date of birth, ID band Patient confused    Reviewed: Allergy & Precautions, NPO status , Patient's Chart, lab work & pertinent test results  History of Anesthesia Complications Negative for: history of anesthetic complications  Airway Mallampati: II  TM Distance: >3 FB Neck ROM: Full    Dental  (+) Edentulous Upper, Edentulous Lower, Dental Advisory Given   Pulmonary neg recent URI, former smoker,  Covid-19 Nucleic Acid Test Results Lab Results      Component                Value               Date                      SARSCOV2NAA              NEGATIVE            05/02/2020                SARSCOV2NAA              NEGATIVE            03/02/2020                SARSCOV2NAA              Not Detected        04/29/2019              breath sounds clear to auscultation       Cardiovascular hypertension, + CAD   Rhythm:Regular  nonobstructive, myoview normal 03/2007, done by Dr. 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 Left eye sees shadows and is bothered by bright lights. She wears specialty sunglasses which help her with sensitivity.     GI/Hepatic Gallstones s/p cholecystectomy   Endo/Other    Renal/GU      Musculoskeletal  (+) Arthritis ,   Abdominal   Peds  Hematology   Anesthesia Other Findings   Reproductive/Obstetrics                             Anesthesia Physical Anesthesia Plan  ASA: II  Anesthesia Plan: General   Post-op Pain Management:    Induction: Intravenous  PONV Risk Score and Plan: 3 and Ondansetron and Dexamethasone  Airway Management Planned: Oral ETT  Additional Equipment: None  Intra-op Plan:   Post-operative Plan: Extubation in OR  Informed Consent: I have  reviewed the patients History and Physical, chart, labs and discussed the procedure including the risks, benefits and alternatives for the proposed anesthesia with the patient or authorized representative who has indicated his/her understanding and acceptance.     Dental advisory given  Plan Discussed with: CRNA  Anesthesia Plan Comments:         Anesthesia Quick Evaluation

## 2020-05-05 NOTE — Interval H&P Note (Signed)
History and Physical Interval Note:  05/05/2020 2:24 PM  [name]  has presented today for surgery, with the diagnosis of Choledocholithiasis.  The various methods of treatment have been discussed with the patient and family. After consideration of risks, benefits and other options for treatment, the patient has consented to  Procedure(s): ENDOSCOPIC RETROGRADE CHOLANGIOPANCREATOGRAPHY (ERCP) (N/A) as a surgical intervention.  The patient's history has been reviewed, patient examined, no change in status, stable for surgery.  I have reviewed the patient's chart and labs.  Questions were answered to the patient's satisfaction.     Leviticus Harton D

## 2020-05-05 NOTE — Anesthesia Procedure Notes (Signed)
Procedure Name: Intubation Date/Time: 05/05/2020 2:35 PM Performed by: Lowella Dell, CRNA Pre-anesthesia Checklist: Patient identified, Emergency Drugs available, Suction available and Patient being monitored Patient Re-evaluated:Patient Re-evaluated prior to induction Oxygen Delivery Method: Circle System Utilized Preoxygenation: Pre-oxygenation with 100% oxygen Induction Type: IV induction Ventilation: Mask ventilation without difficulty Laryngoscope Size: Mac and 3 Grade View: Grade I Tube type: Oral Tube size: 7.0 mm Number of attempts: 1 Airway Equipment and Method: Stylet Placement Confirmation: ETT inserted through vocal cords under direct vision,  positive ETCO2 and breath sounds checked- equal and bilateral Secured at: 22 cm Tube secured with: Tape Dental Injury: Teeth and Oropharynx as per pre-operative assessment

## 2020-05-05 NOTE — Transfer of Care (Signed)
Immediate Anesthesia Transfer of Care Note  Patient: [name]  Procedure(s) Performed: ENDOSCOPIC RETROGRADE CHOLANGIOPANCREATOGRAPHY (ERCP) (N/A ) SPHINCTEROTOMY REMOVAL OF STONES  Patient Location: PACU and Endoscopy Unit  Anesthesia Type:General  Level of Consciousness: drowsy and patient cooperative  Airway & Oxygen Therapy: Patient Spontanous Breathing and Patient connected to face mask oxygen  Post-op Assessment: Report given to RN and Post -op Vital signs reviewed and stable  Post vital signs: Reviewed and stable  Last Vitals:  Vitals Value Taken Time  BP    Temp    Pulse    Resp    SpO2      Last Pain:  Vitals:   05/05/20 1322  TempSrc: Oral  PainSc: 6       Patients Stated Pain Goal: 3 (05/04/20 1245)  Complications: No complications documented.

## 2020-05-05 NOTE — Progress Notes (Signed)
Patient transferred to OR at this time. Report given to receiving nurse Carollee Herter, RN with all questions answered. Consent form signed and placed in chart.

## 2020-05-06 NOTE — Discharge Instructions (Signed)
[facility] ______[facility] °LAPAROSCOPIC SURGERY: POST OP INSTRUCTIONS °Always review your discharge instruction sheet given to you by the facility where your surgery was performed. °IF YOU HAVE DISABILITY OR FAMILY LEAVE FORMS, YOU MUST BRING THEM TO THE OFFICE FOR PROCESSING.   °DO NOT GIVE THEM TO YOUR DOCTOR. ° °1. A prescription for pain medication may be given to you upon discharge.  Take your pain medication as prescribed, if needed.  If narcotic pain medicine is not needed, then you may take acetaminophen (Tylenol) or ibuprofen (Advil) as needed. °2. Take your usually prescribed medications unless otherwise directed. °3. If you need a refill on your pain medication, please contact your pharmacy.  They will contact our office to request authorization. Prescriptions will not be filled after 5pm or on week-ends. °4. You should follow a light diet the first few days after arrival home, such as soup and crackers, etc.  Be sure to include lots of fluids daily. °5. Most patients will experience some swelling and bruising in the area of the incisions.  Ice packs will help.  Swelling and bruising can take several days to resolve.  °6. It is common to experience some constipation if taking pain medication after surgery.  Increasing fluid intake and taking a stool softener (such as Colace) will usually help or prevent this problem from occurring.  A mild laxative (Milk of Magnesia or Miralax) should be taken according to package instructions if there are no bowel movements after 48 hours. °7. Unless discharge instructions indicate otherwise, you may remove your bandages 24-48 hours after surgery, and you may shower at that time.  You may have steri-strips (small skin tapes) in place directly over the incision.  These strips should be left on the skin for 7-10 days.  If your surgeon used skin glue on the incision, you may shower in 24 hours.  The glue will flake off over the next 2-3 weeks.  Any sutures or  staples will be removed at the office during your follow-up visit. °8. ACTIVITIES:  You may resume regular (light) daily activities beginning the next day--such as daily self-care, walking, climbing stairs--gradually increasing activities as tolerated.  You may have sexual intercourse when it is comfortable.  Refrain from any heavy lifting or straining until approved by your doctor. °a. You may drive when you are no longer taking prescription pain medication, you can comfortably wear a seatbelt, and you can safely maneuver your car and apply brakes. °b. RETURN TO WORK:  __________________________________________________________ °9. You should see your doctor in the office for a follow-up appointment approximately 2-3 weeks after your surgery.  Make sure that you call for this appointment within a day or two after you arrive home to insure a convenient appointment time. °10. OTHER INSTRUCTIONS: __________________________________________________________________________________________________________________________ __________________________________________________________________________________________________________________________ °WHEN TO CALL YOUR DOCTOR: °1. Fever over 101.0 °2. Inability to urinate °3. Continued bleeding from incision. °4. Increased pain, redness, or drainage from the incision. °5. Increasing abdominal pain ° °The clinic staff is available to answer your questions during regular business hours.  Please don’t hesitate to call and ask to speak to one of the nurses for clinical concerns.  If you have a medical emergency, go to the nearest emergency room or call 911.  A surgeon from [facility] is always on call at the hospital. °[address], [address], [place], [place]  [address] ? [address] [address], [place], [place]   [address] °[phone] ? [phone] ? FAX [phone] °Web site:   [email or web address] °

## 2020-05-06 NOTE — Progress Notes (Signed)
Patient is discharged from room 3C10 at this time. Alert and in stable condition. IV site d/c'd and instructions read to patient with understanding verbalized and all questions answered. Left unit via wheelchair with all belongings at side.  

## 2020-05-06 NOTE — Discharge Summary (Signed)
Physician Discharge Summary  Patient ID: [name] MRN: [id number] DOB/AGE: 1936/01/01 84 y.o.  Admit date: 05/04/2020 Discharge date: 05/06/2020  Admission Diagnoses:  Chronic cholecystitis  Discharge Diagnoses: Chronic cholecystitis     Choledocholithiasis Active Problems:   Gallstones   Discharged Condition: good  Hospital Course: Lap chole with IOC 05/04/20 by Dr. Carolynne Edouard.  She was found to have a stone in her common bile duct.  She underwent ERCP with stone extraction by Dr. Elnoria Howard on 05/05/20.  She is doing well and is pain free.  Tolerating diet  Consults: GI - Dr. Elnoria Howard    Treatments: surgery: Lap chole with IOC/ ERCP  Discharge Exam: Blood pressure (!) 154/70, pulse 65, temperature 98.1 F (36.7 C), temperature source Oral, resp. rate 18, height 5\' 5"  (1.651 m), weight 67.9 kg, SpO2 98 %. General appearance: alert, cooperative and no distress GI: soft, non-tender; bowel sounds normal; no masses,  no organomegaly and non-distended Incisions c/d/i  Disposition: Discharge disposition: 01-Home or Self Care       Discharge Instructions    Call MD for:  persistant nausea and vomiting   Complete by: As directed    Call MD for:  redness, tenderness, or signs of infection (pain, swelling, redness, odor or green/yellow discharge around incision site)   Complete by: As directed    Call MD for:  severe uncontrolled pain   Complete by: As directed    Call MD for:  temperature >100.4   Complete by: As directed    Diet general   Complete by: As directed    Driving Restrictions   Complete by: As directed    Do not drive while taking pain medications   Increase activity slowly   Complete by: As directed    May shower / Bathe   Complete by: As directed      Allergies as of 05/06/2020      Reactions   Aspirin    REACTION: gastritis with GERD   Codeine Nausea Only      Medication List    TAKE these medications   albuterol 108 (90 Base) MCG/ACT inhaler Commonly known  as: Proventil HFA Inhale 1 puff into the lungs every 6 (six) hours as needed for wheezing or shortness of breath.   Alphagan P 0.1 % Soln Generic drug: brimonidine NEW PRESCRIPTION REQUEST: INSTILL ONE DROP IN THE LEFT EYE TWICE DAILY What changed: See the new instructions.   cloNIDine 0.1 MG tablet Commonly known as: CATAPRES Take 1 tablet (0.1 mg total) by mouth 2 (two) times daily.   cyclobenzaprine 5 MG tablet Commonly known as: FLEXERIL NEW PRESCRIPTION REQUEST: TAKE ONE TABLET BY MOUTH AT BEDTIME What changed: See the new instructions.   donepezil 5 MG tablet Commonly known as: ARICEPT NEW PRESCRIPTION REQUEST: TAKE ONE TABLET BY MOUTH AT BEDTIME What changed: See the new instructions.   gabapentin 100 MG capsule Commonly known as: NEURONTIN TAKE 2 CAPSULES BY MOUTH IN THE MORNING, AND 4 CAPSULES AT BEDTIME What changed:   how much to take  how to take this  when to take this  additional instructions   lisinopril 40 MG tablet Commonly known as: ZESTRIL NEW PRESCRIPTION REQUEST: TAKE ONE TABLET BY MOUTH DAILY What changed: See the new instructions.   Lumigan 0.03 % ophthalmic solution Generic drug: bimatoprost Place 1 drop into the left eye at bedtime.   nitroGLYCERIN 0.4 MG SL tablet Commonly known as: NITROSTAT NEW PRESCRIPTION REQUEST: DISSOLVE 1 TABLET UNDER THE TONGUE EVERY 5  MINUTES AS NEEDED FOR CHEST PAIN. DO NOT EXCEED A TOTAL OF 3 DOSES IN 15 MINUTES. What changed: See the new instructions.   oxyCODONE 5 MG immediate release tablet Commonly known as: Oxy IR/ROXICODONE Take 1 tablet (5 mg total) by mouth 2 (two) times daily as needed for severe pain. What changed: Another medication with the same name was added. Make sure you understand how and when to take each.   oxyCODONE 5 MG immediate release tablet Commonly known as: Oxy IR/ROXICODONE Take 1-2 tablets (5-10 mg total) by mouth every 6 (six) hours as needed for moderate pain, severe pain or  breakthrough pain. What changed: You were already taking a medication with the same name, and this prescription was added. Make sure you understand how and when to take each.   potassium chloride 10 MEQ tablet Commonly known as: KLOR-CON TAKE 1 TABLET(10 MEQ) BY MOUTH DAILY What changed: See the new instructions.   pravastatin 20 MG tablet Commonly known as: PRAVACHOL Take 1 tablet (20 mg total) by mouth daily.       Follow-up Information    Chevis Pretty III, MD Follow up in 3 week(s).   Specialty: General Surgery Contact information: [address] [place] [address] [phone]               Signed: Wynona Luna 05/06/2020, 8:22 AM

## 2020-05-06 NOTE — Progress Notes (Signed)
UNASSIGNED PATIENT Subjective: [name] is a 84 year old black female, with a history of hypertension, hyperlipidemia, CAD, glaucoma, dementia who was admitted to the hospital with symptomatic gallstones and underwent a laparoscopic cholecystectomy on 05/04/2020 and was found to have a positive IOC for which she had a ERCP yesterday and had extraction of bile duct stones after a biliary sphincterotomy.  Patient does not remember having a procedure done yesterday. She has her granddaughter sitting at her bedside who has reminded her about the procedure she had.  Patient has a regular breakfast at the bedside with eggs and bacon.  She denies having any abdominal pain, nausea, vomiting or abdominal distention.   Objective: Vital signs in last 24 hours: Temp:  [97.5 F (36.4 C)-98.3 F (36.8 C)] 98.3 F (36.8 C) (09/11 0257) Pulse Rate:  [51-71] 68 (09/11 0257) Resp:  [16-27] 18 (09/11 0257) BP: (132-160)/(46-85) 145/59 (09/11 0257) SpO2:  [92 %-99 %] 95 % (09/11 0257) Last BM Date: 05/03/20  Intake/Output from previous day: 09/10 0701 - 09/11 0700 In: 200 [IV Piggyback:200] Out: 0  Intake/Output this shift: No intake/output data recorded.  General appearance: cooperative, appears stated age and no distress, demented Resp: clear to auscultation bilaterally Cardio: regular rate and rhythm, S1, S2 normal, no murmur, click, rub or gallop GI: soft, non-tender; bowel sounds normal; no masses,  no organomegaly  Studies/Results: DG Cholangiogram Operative  Result Date: 05/04/2020 CLINICAL DATA:  84 year old with a history of cholelithiasis EXAM: INTRAOPERATIVE CHOLANGIOGRAM TECHNIQUE: Cholangiographic images from the C-arm fluoroscopic device were submitted for interpretation post-operatively. Please see the procedural report for the amount of contrast and the fluoroscopy time utilized. COMPARISON:  Ultrasound 03/02/2020 FINDINGS: Surgical instruments project over the upper abdomen. There is  cannulation of the cystic duct/gallbladder neck, with antegrade infusion of contrast. Caliber of the extrahepatic ductal system within normal limits. Vague filling defects at the distal common bile duct above the ampulla. Free flow of contrast across the ampulla. IMPRESSION: Intraoperative cholangiogram demonstrates extrahepatic biliary ducts of unremarkable caliber, with vague filling defects above the ampulla at the distal common bile duct, potentially retained debris/stones. Contrast does traverse this region and passed through the ampulla into the duodenum. Correlation with MRCP/ERCP may be considered. Please refer to the dictated operative report for full details of intraoperative findings and procedure Electronically Signed   By: Gilmer Mor D.O.   On: 05/04/2020 14:07   DG ERCP BILIARY & PANCREATIC DUCTS  Result Date: 05/05/2020 CLINICAL DATA:  ERCP with sphincterotomy. EXAM: ERCP TECHNIQUE: Multiple spot images obtained with the fluoroscopic device and submitted for interpretation post-procedure. FLUOROSCOPY TIME:  Fluoroscopy Time:  2 minutes and 11 seconds Radiation Exposure Index (if provided by the fluoroscopic device): 1.2 mGy Number of Acquired Spot Images: 7 COMPARISON:  05/04/2020 FINDINGS: ERCP demonstrates opacification of the common bile duct with findings suspicious for a filling defect within the distal common bile duct. Subsequent balloon sweep demonstrates no clear filling defect within the common bile duct. The biliary tree does not appear to be dilated. IMPRESSION: Status post ERCP as detailed above. Please see separate operative report for complete intraoperative details. These images were submitted for radiologic interpretation only. Please see the procedural report for the amount of contrast and the fluoroscopy time utilized. Electronically Signed   By: Katherine Mantle M.D.   On: 05/05/2020 15:43   Medications: I have reviewed the patient's current  medications.  Assessment/Plan: History of cholelithiasis with choledocholithiasis-patient is status post laparoscopic cholecystectomy followed by an ERCP when  she was found to have a positive IOC-had a biliary sphincterotomy with extraction of stones. Patient is completely asymptomatic this morning with no evidence of post ERCP pancreatitis. She is tolerating a regular diet.  I think patient is ready for discharge. There were no labs ordered today for my review.  We will sign off now please call if there are any questions.   LOS: 2 days   Charna Elizabeth 05/06/2020, 6:58 AM

## 2020-05-08 NOTE — Anesthesia Postprocedure Evaluation (Signed)
Anesthesia Post Note  Patient: [name]  Procedure(s) Performed: ENDOSCOPIC RETROGRADE CHOLANGIOPANCREATOGRAPHY (ERCP) (N/A ) SPHINCTEROTOMY REMOVAL OF STONES     Patient location during evaluation: Endoscopy Anesthesia Type: General Level of consciousness: awake and alert Pain management: pain level controlled Vital Signs Assessment: post-procedure vital signs reviewed and stable Respiratory status: spontaneous breathing, nonlabored ventilation, respiratory function stable and patient connected to nasal cannula oxygen Cardiovascular status: blood pressure returned to baseline and stable Postop Assessment: no apparent nausea or vomiting Anesthetic complications: no   No complications documented.  Last Vitals:  Vitals:   05/06/20 0257 05/06/20 0756  BP: (!) 145/59 (!) 154/70  Pulse: 68 65  Resp: 18 18  Temp: 36.8 C 36.7 C  SpO2: 95% 98%    Last Pain:  Vitals:   05/06/20 0800  TempSrc:   PainSc: 3                  Niaya Hickok

## 2020-05-09 ENCOUNTER — Encounter (HOSPITAL_COMMUNITY): Payer: Self-pay | Admitting: Gastroenterology

## 2020-05-11 ENCOUNTER — Telehealth: Payer: Self-pay

## 2020-05-11 NOTE — Telephone Encounter (Signed)
oxyCODONE (OXY IR/ROXICODONE) 5 MG immediate release tablet, refill request @  [id number] [place], [place] - [address] AT [facility] & [address] Phone:  [phone]  Fax:  [phone]

## 2020-05-12 ENCOUNTER — Telehealth: Payer: Self-pay | Admitting: *Deleted

## 2020-05-12 NOTE — Telephone Encounter (Signed)
Called pt - stated she had gallbladder surgery.I asked if she had called the surgeon; per Epic Dr Carolynne Edouard prescribed Oxycodone 10 tabs on 9/10. Stated she will have her daughter call his office.

## 2020-05-12 NOTE — Telephone Encounter (Signed)
Patient requesting refill on Oxycodone. States she had gallbladder surgery and in a lot of pain.

## 2020-05-12 NOTE — Telephone Encounter (Signed)
Spoke with patient and daughter. She is having some expected post-operative soreness. No symptoms of wound infection, pancreatitis, or other complication. She is about 9 days out of the surgery, no need for more oxycodone. We talked about ibuprofen and tylenol. If still having function limiting pain early next week, will need acute visit for wound check and labs.   Front desk, can you please schedule her a follow up appointment with me in October?

## 2020-05-18 ENCOUNTER — Other Ambulatory Visit: Payer: Self-pay

## 2020-05-19 MED ORDER — CYCLOBENZAPRINE HCL 5 MG PO TABS
5.0000 mg | ORAL_TABLET | Freq: Every day | ORAL | 0 refills | Status: DC
Start: 2020-05-19 — End: 2020-06-12

## 2020-05-31 DIAGNOSIS — Z23 Encounter for immunization: Secondary | ICD-10-CM | POA: Diagnosis not present

## 2020-06-02 ENCOUNTER — Other Ambulatory Visit: Payer: Self-pay | Admitting: Student in an Organized Health Care Education/Training Program

## 2020-06-09 ENCOUNTER — Other Ambulatory Visit: Payer: Self-pay | Admitting: Student in an Organized Health Care Education/Training Program

## 2020-06-12 ENCOUNTER — Encounter: Payer: Self-pay | Admitting: Student in an Organized Health Care Education/Training Program

## 2020-06-12 ENCOUNTER — Ambulatory Visit (INDEPENDENT_AMBULATORY_CARE_PROVIDER_SITE_OTHER): Payer: Medicare (Managed Care) | Admitting: Student in an Organized Health Care Education/Training Program

## 2020-06-12 ENCOUNTER — Other Ambulatory Visit: Payer: Self-pay

## 2020-06-12 VITALS — BP 205/76 | HR 81 | Temp 98.3°F | Ht 66.0 in | Wt 145.1 lb

## 2020-06-12 DIAGNOSIS — E876 Hypokalemia: Secondary | ICD-10-CM

## 2020-06-12 DIAGNOSIS — G3184 Mild cognitive impairment, so stated: Secondary | ICD-10-CM

## 2020-06-12 DIAGNOSIS — I10 Essential (primary) hypertension: Secondary | ICD-10-CM | POA: Diagnosis not present

## 2020-06-12 DIAGNOSIS — Z23 Encounter for immunization: Secondary | ICD-10-CM

## 2020-06-12 MED ORDER — CLONIDINE HCL 0.1 MG PO TABS
0.1000 mg | ORAL_TABLET | Freq: Two times a day (BID) | ORAL | 3 refills | Status: DC
Start: 2020-06-12 — End: 2021-06-26

## 2020-06-12 MED ORDER — PRAVASTATIN SODIUM 20 MG PO TABS
20.0000 mg | ORAL_TABLET | Freq: Every day | ORAL | 1 refills | Status: DC
Start: 2020-06-12 — End: 2021-12-28

## 2020-06-12 MED ORDER — GABAPENTIN 100 MG PO CAPS
ORAL_CAPSULE | ORAL | 3 refills | Status: DC
Start: 2020-06-12 — End: 2021-06-26

## 2020-06-12 MED ORDER — LISINOPRIL 40 MG PO TABS
40.0000 mg | ORAL_TABLET | Freq: Every day | ORAL | 3 refills | Status: DC
Start: 2020-06-12 — End: 2021-06-26

## 2020-06-12 MED ORDER — DONEPEZIL HCL 5 MG PO TABS
5.0000 mg | ORAL_TABLET | Freq: Every day | ORAL | 3 refills | Status: DC
Start: 2020-06-12 — End: 2023-04-07

## 2020-06-12 NOTE — Patient Instructions (Signed)
It was great seeing you today in the clinic.  Your blood pressure was very high because of missing your doses of blood pressure medications.  I have sent a refill for all your medications to your pharmacy.  Please take those as prescribed.  Let me know if you have any questions.  Please check your blood pressure at home and call me if your blood pressure remains elevated over 140/90.

## 2020-06-12 NOTE — Assessment & Plan Note (Signed)
Severe chronic asymptomatic hypertension due to medication inconsistency.  Plan is to continue with lisinopril 40 mg daily and clonidine 0.1 mg twice daily.  I sent 1 year refills to her pharmacy.  Talked about the importance of consistency with these medications.  Unable to tolerate diuretics or calcium channel blockers.  When she does take these medications they are effective.  Unfortunately we have been unable to improve consistency over the last several years.

## 2020-06-12 NOTE — Assessment & Plan Note (Signed)
This problem seems to be resolved, or may be just mildly intermittent.  She is on an ACE inhibitor.  Will discontinue the potassium supplements at this time.  Important to reduce pill burden.

## 2020-06-12 NOTE — Progress Notes (Signed)
   Assessment and Plan:  See Encounters tab for problem-based medical decision making.   __________________________________________________________  HPI:   84 year old person living with hypertension here for follow-up after a cholecystectomy.  Surgery was in September, complicated by retained stone in the common bile duct that was removed with ERCP.  She has done very well since discharge back to home.  Reports normal nutrition.  No diarrhea.  No abdominal pain.  No fevers or chills.  Reports normal functional status.  Lives with a granddaughter who helps assist with her activities of daily living.  She is blind and so does need some help.  Only alone for a few hours out of the day, family has no safety concerns.  Memory dysfunction continues to be an issue.  No wandering, no falls.  She needs family help to remember to take her medications.  __________________________________________________________  Problem List: Patient Active Problem List   Diagnosis Date Noted  . At high risk for falls 04/15/2016    Priority: High  . Cervical osteoarthritis 02/17/2017    Priority: Medium  . Mild cognitive impairment 08/05/2016    Priority: Medium  . Stress incontinence 06/20/2016    Priority: Medium  . Calcification of aorta (HCC) 04/15/2016    Priority: Medium  . Glaucoma 04/22/2007    Priority: Medium  . Essential hypertension 04/22/2007    Priority: Medium  . Hyperlipidemia 05/25/2019    Priority: Low  . Hypokalemia 03/07/2016    Priority: Low  . Health care maintenance 09/21/2014    Priority: Low  . 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

## 2020-06-12 NOTE — Assessment & Plan Note (Signed)
Stable, seems to be age related mild cognitive impairment.  Unable to do our office based cognitive exam is because she is blind.  Treating with donepezil at this time and seems to be stable.  We again talked about reasonable expectations.  Certainly at risk for worsening memory.  Given very elevated hypertension she is also at risk for vascular dementia.  No signs of parkinsonism.  Seems to have a safe living environment right now, good family support.  Talked about the importance of exercise, activity, and social engagement.

## 2020-07-03 ENCOUNTER — Encounter (HOSPITAL_COMMUNITY): Payer: Self-pay

## 2020-07-03 ENCOUNTER — Emergency Department (HOSPITAL_COMMUNITY)
Admission: EM | Admit: 2020-07-03 | Discharge: 2020-07-04 | Disposition: A | Discharge status: Home / Self Care | Payer: Medicare (Managed Care) | Attending: Emergency Medicine | Admitting: Emergency Medicine

## 2020-07-03 ENCOUNTER — Other Ambulatory Visit: Payer: Self-pay

## 2020-07-03 ENCOUNTER — Emergency Department (HOSPITAL_COMMUNITY): Payer: Medicare (Managed Care)

## 2020-07-03 DIAGNOSIS — Z79899 Other long term (current) drug therapy: Secondary | ICD-10-CM | POA: Diagnosis not present

## 2020-07-03 DIAGNOSIS — N390 Urinary tract infection, site not specified: Secondary | ICD-10-CM

## 2020-07-03 DIAGNOSIS — R109 Unspecified abdominal pain: Secondary | ICD-10-CM | POA: Insufficient documentation

## 2020-07-03 DIAGNOSIS — Z9861 Coronary angioplasty status: Secondary | ICD-10-CM | POA: Diagnosis not present

## 2020-07-03 DIAGNOSIS — R300 Dysuria: Secondary | ICD-10-CM | POA: Diagnosis present

## 2020-07-03 DIAGNOSIS — Z87891 Personal history of nicotine dependence: Secondary | ICD-10-CM | POA: Diagnosis not present

## 2020-07-03 DIAGNOSIS — I10 Essential (primary) hypertension: Secondary | ICD-10-CM | POA: Diagnosis not present

## 2020-07-03 DIAGNOSIS — F0390 Unspecified dementia without behavioral disturbance: Secondary | ICD-10-CM | POA: Diagnosis not present

## 2020-07-03 DIAGNOSIS — I2510 Atherosclerotic heart disease of native coronary artery without angina pectoris: Secondary | ICD-10-CM | POA: Insufficient documentation

## 2020-07-03 LAB — URINALYSIS, ROUTINE W REFLEX MICROSCOPIC
Bilirubin Urine: NEGATIVE
Glucose, UA: NEGATIVE mg/dL
Ketones, ur: NEGATIVE mg/dL
Nitrite: NEGATIVE
Protein, ur: 100 mg/dL — AB
RBC / HPF: >50 RBC/hpf — ABNORMAL HIGH (ref 0–5)
Specific Gravity, Urine: 1.005 (ref 1.005–1.030)
WBC, UA: >50 WBC/hpf — ABNORMAL HIGH (ref 0–5)
pH: 6 (ref 5.0–8.0)

## 2020-07-03 LAB — COMPREHENSIVE METABOLIC PANEL
ALT: 11 U/L (ref 0–44)
AST: 15 U/L (ref 15–41)
Albumin: 4.1 g/dL (ref 3.5–5.0)
Alkaline Phosphatase: 125 U/L (ref 38–126)
Anion gap: 15 (ref 5–15)
BUN: 10 mg/dL (ref 8–23)
CO2: 25 mmol/L (ref 22–32)
Calcium: 9.5 mg/dL (ref 8.9–10.3)
Chloride: 101 mmol/L (ref 98–111)
Creatinine, Ser: 1.04 mg/dL — ABNORMAL HIGH (ref 0.44–1.00)
GFR, Estimated: 54 mL/min — ABNORMAL LOW (ref >60)
Glucose, Bld: 115 mg/dL — ABNORMAL HIGH (ref 70–99)
Potassium: 3.5 mmol/L (ref 3.5–5.1)
Sodium: 141 mmol/L (ref 135–145)
Total Bilirubin: 0.7 mg/dL (ref 0.3–1.2)
Total Protein: 7.2 g/dL (ref 6.5–8.1)

## 2020-07-03 LAB — CBC WITH DIFFERENTIAL/PLATELET
Abs Immature Granulocytes: 0.02 10*3/uL (ref 0.00–0.07)
Basophils Absolute: 0.1 10*3/uL (ref 0.0–0.1)
Basophils Relative: 1 %
Eosinophils Absolute: 0.5 10*3/uL (ref 0.0–0.5)
Eosinophils Relative: 6 %
HCT: 41.8 % (ref 36.0–46.0)
Hemoglobin: 13.7 g/dL (ref 12.0–15.0)
Immature Granulocytes: 0 %
Lymphocytes Relative: 24 %
Lymphs Abs: 1.8 10*3/uL (ref 0.7–4.0)
MCH: 28.9 pg (ref 26.0–34.0)
MCHC: 32.8 g/dL (ref 30.0–36.0)
MCV: 88.2 fL (ref 80.0–100.0)
Monocytes Absolute: 0.6 10*3/uL (ref 0.1–1.0)
Monocytes Relative: 8 %
Neutro Abs: 4.6 10*3/uL (ref 1.7–7.7)
Neutrophils Relative %: 61 %
Platelets: 303 10*3/uL (ref 150–400)
RBC: 4.74 MIL/uL (ref 3.87–5.11)
RDW: 13.5 % (ref 11.5–15.5)
WBC: 7.6 10*3/uL (ref 4.0–10.5)
nRBC: 0 % (ref 0.0–0.2)

## 2020-07-03 MED ORDER — CEPHALEXIN 500 MG PO CAPS: 500.0000 mg | ORAL_CAPSULE | Freq: Three times a day (TID) | ORAL | 0 refills | Status: DC

## 2020-07-03 MED ORDER — SODIUM CHLORIDE 0.9 % IV SOLN
1.0000 g | Freq: Once | INTRAVENOUS | Status: AC
  Administered 2020-07-03: 1 g via INTRAVENOUS
  Filled 2020-07-03: qty 10

## 2020-07-03 MED ORDER — IOHEXOL 300 MG/ML  SOLN
100.0000 mL | Freq: Once | INTRAMUSCULAR | Status: AC | PRN
  Administered 2020-07-03: 100 mL via INTRAVENOUS

## 2020-07-03 MED ORDER — SODIUM CHLORIDE 0.9 % IV BOLUS
500.0000 mL | Freq: Once | INTRAVENOUS | Status: AC
  Administered 2020-07-03: 500 mL via INTRAVENOUS

## 2020-07-03 MED ORDER — SODIUM CHLORIDE (PF) 0.9 % IJ SOLN
INTRAMUSCULAR | Status: AC
  Filled 2020-07-03: qty 50

## 2020-07-03 NOTE — ED Triage Notes (Signed)
Pt reports urinary retention and difficulty emptying bladder for about a week. Pt sts she is able to it takes much longer and becomes painful.

## 2020-07-03 NOTE — ED Provider Notes (Signed)
[facility]-EMERGENCY DEPT Provider Note   CSN: [id number] Arrival date & time: 07/03/20  1943     History Chief Complaint  Patient presents with  . Dysuria    [name] is a 84 y.o. female.  HPI     84 year old female comes in a chief complaint of urinary retention and pain with urination.  She reports that she started noticing urinary retention earlier today.  When she does go to the bathroom it hurts.  She is also having lower quadrant abdominal pain.  She had cholecystectomy earlier this month.  She denies any history of kidney stones.  There is no history of diverticulitis.  Past Medical History:  Diagnosis Date  . Arthritis   . 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 Left eye sees shadows and is bothered by bright lights. She wears specialty sunglasses which help her with sensitivity.   [name] CAD (coronary artery disease)    nonobstructive, myoview normal 03/2007, done by Dr. Daleen Squibb - EF = 75%  . Dementia (HCC)   . Glaucoma   . HLD (hyperlipidemia)   . Hypertension     Patient Active Problem List   Diagnosis Date Noted  . Hyperlipidemia 05/25/2019  . Cervical osteoarthritis 02/17/2017  . Mild cognitive impairment 08/05/2016  . Stress incontinence 06/20/2016  . Calcification of aorta (HCC) 04/15/2016  . At high risk for falls 04/15/2016  . Hypokalemia 03/07/2016  . Health care maintenance 09/21/2014  . 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 Glaucoma 04/22/2007  . Essential hypertension 04/22/2007    Past Surgical History:  Procedure Laterality Date  . CARDIAC CATHETERIZATION    . CHOLECYSTECTOMY N/A 05/04/2020   Procedure: LAPAROSCOPIC CHOLECYSTECTOMY WITH INTRAOPERATIVE CHOLANGIOGRAM;  Surgeon: Griselda Miner, MD;  Location: [facility] OR;  Service: General;  Laterality: N/A;  . DILATION  AND CURETTAGE OF UTERUS    . ERCP N/A 05/05/2020   Procedure: ENDOSCOPIC RETROGRADE CHOLANGIOPANCREATOGRAPHY (ERCP);  Surgeon: Jeani Hawking, MD;  Location: [facility] ENDOSCOPY;  Service: Endoscopy;  Laterality: N/A;  . EYE SURGERY    . OCULAR PROSTHESIS REMOVAL     Left eye   . REMOVAL OF STONES  05/05/2020   Procedure: REMOVAL OF STONES;  Surgeon: Jeani Hawking, MD;  Location: [facility] ENDOSCOPY;  Service: Endoscopy;;  . Dennison Mascot  05/05/2020   Procedure: Dennison Mascot;  Surgeon: Jeani Hawking, MD;  Location: [facility] ENDOSCOPY;  Service: Endoscopy;;  . TONSILLECTOMY       OB History   No obstetric history on file.     Family History  Problem Relation Age of Onset  . Stroke Neg Hx   . Cancer Neg Hx     Social History   Tobacco Use  . Smoking status: Former Smoker    Types: Cigarettes  . Smokeless tobacco: Never Used  Vaping Use  . Vaping Use: Never used  Substance Use Topics  . Alcohol use: No  . Drug use: No    Home Medications Prior to Admission medications   Medication Sig Start Date End Date Taking? Authorizing Provider  ALPHAGAN P 0.1 % SOLN NEW PRESCRIPTION REQUEST: INSTILL ONE DROP IN THE LEFT EYE TWICE DAILY Patient taking differently: Place 1 drop into the left eye in the morning and at bedtime.  08/17/19  Yes Tyson Alias, MD  bimatoprost (LUMIGAN) 0.03 % ophthalmic  drops Place 1 drop into the left eye at bedtime.     Yes [provider]  cloNIDine (CATAPRES) 0.1 MG tablet Take 1 tablet (0.1 mg total) by mouth 2 (two) times daily. 06/12/20  Yes Tyson Alias, MD  gabapentin (NEURONTIN) 100 MG capsule TAKE 2 CAPSULES BY MOUTH IN THE MORNING, AND 4 CAPSULES AT BEDTIME 06/12/20  Yes Tyson Alias, MD  lisinopril (ZESTRIL) 40 MG tablet Take 1 tablet (40 mg total) by mouth daily. 06/12/20  Yes Tyson Alias, MD  nitroGLYCERIN (NITROSTAT) 0.4 MG SL tablet NEW PRESCRIPTION REQUEST: DISSOLVE 1 TABLET UNDER THE TONGUE EVERY 5 MINUTES AS  NEEDED FOR CHEST PAIN. DO NOT EXCEED A TOTAL OF 3 DOSES IN 15 MINUTES. Patient taking differently: Place 0.4 mg under the tongue every 5 (five) minutes as needed for chest pain.  08/17/19  Yes Tyson Alias, MD  albuterol (PROVENTIL HFA) 108 (90 Base) MCG/ACT inhaler Inhale 1 puff into the lungs every 6 (six) hours as needed for wheezing or shortness of breath. Patient not taking: Reported on 07/03/2020 07/20/19   Tyson Alias, MD  cephALEXin (KEFLEX) 500 MG capsule Take 1 capsule (500 mg total) by mouth 3 (three) times daily. 07/03/20   Derwood Kaplan, MD  donepezil (ARICEPT) 5 MG tablet Take 1 tablet (5 mg total) by mouth at bedtime. Patient not taking: Reported on 07/03/2020 06/12/20   Tyson Alias, MD  pravastatin (PRAVACHOL) 20 MG tablet Take 1 tablet (20 mg total) by mouth daily. Patient not taking: Reported on 07/03/2020 06/12/20   Tyson Alias, MD    Allergies    Aspirin and Codeine  Review of Systems   Review of Systems  Constitutional: Positive for activity change.  Respiratory: Negative for shortness of breath.   Cardiovascular: Negative for chest pain.  Gastrointestinal: Positive for abdominal pain and constipation. Negative for nausea.  Genitourinary: Positive for dysuria.  All other systems reviewed and are negative.   Physical Exam Updated Vital Signs BP (!) 161/84 (BP Location: Right Arm)   Pulse 78   Temp 98 F (36.7 C) (Oral)   Resp (!) 24   Ht 5\' 5"  (1.651 m)   Wt 63.5 kg   SpO2 97%   BMI 23.30 kg/m   Physical Exam Vitals and nursing note reviewed.  Constitutional:      Appearance: She is well-developed.  HENT:     Head: Normocephalic and atraumatic.  Cardiovascular:     Rate and Rhythm: Normal rate.  Pulmonary:     Effort: Pulmonary effort is normal.  Abdominal:     General: Bowel sounds are normal.  Musculoskeletal:     Cervical back: Normal range of motion and neck supple.  Skin:    General: Skin is warm and  dry.  Neurological:     Mental Status: She is alert and oriented to person, place, and time.     ED Results / Procedures / Treatments   Labs (all labs ordered are listed, but only abnormal results are displayed) Labs Reviewed  URINALYSIS, ROUTINE W REFLEX MICROSCOPIC - Abnormal; Notable for the following components:      Result Value   APPearance CLOUDY (*)    Hgb urine dipstick MODERATE (*)    Protein, ur 100 (*)    Leukocytes,Ua LARGE (*)    RBC / HPF >50 (*)    WBC, UA >50 (*)    Bacteria, UA RARE (*)    All other components within normal limits  COMPREHENSIVE METABOLIC PANEL - Abnormal; Notable for the following components:   Glucose, Bld 115 (*)    Creatinine, Ser 1.04 (*)    GFR, Estimated 54 (*)    All other components within normal limits  CBC WITH DIFFERENTIAL/PLATELET    EKG None  Radiology CT ABDOMEN PELVIS W CONTRAST  Result Date: 07/03/2020 CLINICAL DATA:  Diverticulitis suspected, urinary retention and difficulty emptying bladder for a week EXAM: CT ABDOMEN AND PELVIS WITH CONTRAST TECHNIQUE: Multidetector CT imaging of the abdomen and pelvis was performed using the standard protocol following bolus administration of intravenous contrast. CONTRAST:  100mL OMNIPAQUE IOHEXOL 300 MG/ML  SOLN COMPARISON:  CT 03/02/2020 FINDINGS: Lower chest: Lung bases are clear. Normal heart size. No pericardial effusion. Hepatobiliary: No worrisome focal liver lesions. Smooth liver surface contour. Normal hepatic attenuation. Some geographic hypoattenuation along the gallbladder fossa may reflect some remote postsurgical change or focal fat. Patient is post cholecystectomy. No biliary ductal dilatation or visible intraductal gallstones. Pancreas: No pancreatic ductal dilatation or surrounding inflammatory changes. Spleen: Normal in size. No concerning splenic lesions. Adrenals/Urinary Tract: Normal adrenal glands. Kidneys are normally located with symmetric enhancement and excretion. No  suspicious renal lesions. Punctate nonobstructing calculus seen in the upper pole left kidney. Few vascular calcifications noted as well. No obstructive urolithiasis. Mild bilateral LV redirected cysts with some questionable urothelial thickening as well as circumferential bladder wall thickening without visible bladder calculi or debris. Stomach/Bowel: Small sliding-type hiatal hernia. Stomach and duodenum are unremarkable. No small bowel thickening or dilatation. No colonic dilatation or wall thickening. No significant diverticular disease. No evidence of bowel obstruction. Vascular/Lymphatic: Atherosclerotic calcifications within the abdominal aorta and branch vessels. No aneurysm or ectasia. No enlarged abdominopelvic lymph nodes. Reproductive: Retroverted uterus with parametrial calcification, unremarkable for patient age. Several calcified fibroids are present as well. No concerning adnexal lesions. Other: No abdominopelvic free fluid or free gas. No bowel containing hernias. Musculoskeletal: No acute osseous abnormality or suspicious osseous lesion. IMPRESSION: 1. No significant diverticular disease. 2. Circumferential bladder wall thickening with more mild pelvic ureterectasis and urothelial thickening. Recommend correlation with urinalysis to exclude an ascending tract infection. 3. Punctate nonobstructing calculus in the upper pole left kidney. No obstructive urolithiasis. 4. Small sliding-type hiatal hernia. 5. Fibroid uterus. 6. Aortic Atherosclerosis (ICD10-I70.0). Electronically Signed   By: Kreg ShropshirePrice  DeHay M.D.   On: 07/03/2020 23:29    Procedures Procedures (including critical care time)  Medications Ordered in ED Medications  sodium chloride (PF) 0.9 % injection (has no administration in time range)  cefTRIAXone (ROCEPHIN) 1 g in sodium chloride 0.9 % 100 mL IVPB (1 g Intravenous New Bag/Given 07/03/20 2223)  sodium chloride 0.9 % bolus 500 mL (500 mLs Intravenous New Bag/Given 07/03/20 2224)    iohexol (OMNIPAQUE) 300 MG/ML solution 100 mL (100 mLs Intravenous Contrast Given 07/03/20 2310)    ED Course  I have reviewed the triage vital signs and the nursing notes.  Pertinent labs & imaging results that were available during my care of the patient were reviewed by me and considered in my medical decision making (see chart for details).  Clinical Course as of Jul 03 2346  [facility] is reassuring.  UA does have clear evidence of infection.  Will start Keflex.   [AN]    Clinical Course User Index [AN] Derwood KaplanNanavati, Stevan Eberwein, MD   MDM Rules/Calculators/A&P  84 year old comes in a chief complaint of urinary discomfort, urinary retention, lower quadrant abdominal pain.  Clinically it appears that she has cystitis.  However, patient is reporting urinary retention with lower quadrant abdominal pain that is new.  She is also having some constipation-like symptoms and had recent cholecystectomy.  Plan is to get CT scan of the abdomen to ensure there is no tumor and to rule out small bowel obstruction.  We would also want to make sure she does not have diverticulitis or complication from it.  Final Clinical Impression(s) / ED Diagnoses Final diagnoses:  Lower urinary tract infectious disease    Rx / DC Orders ED Discharge Orders         Ordered    cephALEXin (KEFLEX) 500 MG capsule  3 times daily        07/03/20 2343           Derwood Kaplan, MD 07/03/20 [phone]

## 2020-07-03 NOTE — ED Notes (Signed)
Bladder scan results: 32 mL.

## 2020-07-03 NOTE — Discharge Instructions (Signed)
Results in the ER show that you have a urinary tract infection. Take the medications prescribed, and see your primary care doctor.  Return to the ER if your symptoms get worse.

## 2020-07-28 DIAGNOSIS — M1712 Unilateral primary osteoarthritis, left knee: Secondary | ICD-10-CM | POA: Diagnosis not present

## 2020-07-28 DIAGNOSIS — M24871 Other specific joint derangements of right ankle, not elsewhere classified: Secondary | ICD-10-CM | POA: Diagnosis not present

## 2020-07-28 DIAGNOSIS — M24872 Other specific joint derangements of left ankle, not elsewhere classified: Secondary | ICD-10-CM | POA: Diagnosis not present

## 2020-07-28 DIAGNOSIS — M1711 Unilateral primary osteoarthritis, right knee: Secondary | ICD-10-CM | POA: Diagnosis not present

## 2020-09-01 ENCOUNTER — Encounter: Payer: Self-pay | Admitting: Physical Therapy

## 2020-11-02 ENCOUNTER — Telehealth: Payer: Self-pay

## 2020-11-02 NOTE — Telephone Encounter (Signed)
Returned call to patient. States she needs a letter for tax purposes stating she is visually impaired. She would like this mailed to her when it's ready.

## 2020-11-02 NOTE — Telephone Encounter (Signed)
Requesting to speak with a nurse about getting a letter for IRS, stating pt is visually impaired. Please call pt back.

## 2020-11-09 ENCOUNTER — Telehealth: Payer: Self-pay

## 2020-11-09 ENCOUNTER — Encounter: Payer: Self-pay | Admitting: Student in an Organized Health Care Education/Training Program

## 2020-11-09 NOTE — Telephone Encounter (Signed)
Letter complete. I hope it helps.

## 2020-11-09 NOTE — Telephone Encounter (Signed)
Pt did not answer the phone and unable to LVM. Want to informed pt the letter has been written for her and ready for pick up.

## 2020-11-13 NOTE — Telephone Encounter (Signed)
Signed letter placed in outgoing mailbox in Medical Records.

## 2021-02-21 ENCOUNTER — Encounter: Payer: Self-pay | Admitting: *Deleted

## 2021-02-21 NOTE — Progress Notes (Unsigned)

## 2021-02-28 NOTE — Progress Notes (Unsigned)
Things That May Be Affecting Your Health:  Alcohol  Hearing loss  Pain    Depression x Home Safety  Sexual Health   Diabetes  Lack of physical activity  Stress   Difficulty with daily activities  Loneliness  Tiredness   Drug use x Medicines  Tobacco use  x Falls  Motor Vehicle Safety  Weight   Food choices  Oral Health  Other    YOUR PERSONALIZED HEALTH PLAN : 1. Schedule your next subsequent Medicare Wellness visit in one year 2. Attend all of your regular appointments to address your medical issues 3. Complete the preventative screenings and services   Annual Wellness Visit   Medicare Covered Preventative Screenings and Services  Services & Screenings Men and Women Who How Often Need? Date of Last Service Action  Abdominal Aortic Aneurysm Adults with AAA risk factors Once      Alcohol Misuse and Counseling All Adults Screening once a year if no alcohol misuse. Counseling up to 4 face to face sessions.     Bone Density Measurement  Adults at risk for osteoporosis Once every 2 yrs      Lipid Panel Z13.6 All adults without CV disease Once every 5 yrs       Colorectal Cancer  Stool sample or Colonoscopy All adults 50 and older  Once every year Every 10 years        Depression All Adults Once a year  Today   Diabetes Screening Blood glucose, post glucose load, or GTT Z13.1 All adults at risk Pre-diabetics Once per year Twice per year      Diabetes  Self-Management Training All adults Diabetics 10 hrs first year; 2 hours subsequent years. Requires Copay     Glaucoma Diabetics Family history of glaucoma African Americans 50 yrs + Hispanic Americans 65 yrs + Annually - requires coppay      Hepatitis C Z72.89 or F19.20 High Risk for HCV Born between 1945 and 1965 Annually Once      HIV Z11.4 All adults based on risk Annually btw ages 74 & 22 regardless of risk Annually > 65 yrs if at increased risk      Lung Cancer Screening Asymptomatic adults aged 43-77 with 30  pack yr history and current smoker OR quit within the last 15 yrs Annually Must have counseling and shared decision making documentation before first screen      Medical Nutrition Therapy Adults with  Diabetes Renal disease Kidney transplant within past 3 yrs 3 hours first year; 2 hours subsequent years     Obesity and Counseling All adults Screening once a year Counseling if BMI 30 or higher  Today   Tobacco Use Counseling Adults who use tobacco  Up to 8 visits in one year     Vaccines Z23 Hepatitis B Influenza  Pneumonia  Adults  Once Once every flu season Two different vaccines separated by one year     Next Annual Wellness Visit People with Medicare Every year  Today     Services & Screenings Women Who How Often Need  Date of Last Service Action  Mammogram  Z12.31 Women over 40 One baseline ages 44-39. Annually ager 40 yrs+      Pap tests All women Annually if high risk. Every 2 yrs for normal risk women      Screening for cervical cancer with  Pap (Z01.419 nl or Z01.411abnl) & HPV Z11.51 Women aged 36 to 5 Once every 5 yrs  Screening pelvic and breast exams All women Annually if high risk. Every 2 yrs for normal risk women     Sexually Transmitted Diseases Chlamydia Gonorrhea Syphilis All at risk adults Annually for non pregnant females at increased risk         Services & Screenings Men Who How Ofter Need  Date of Last Service Action  Prostate Cancer - DRE & PSA Men over 50 Annually.  DRE might require a copay.        Sexually Transmitted Diseases Syphilis All at risk adults Annually for men at increased risk      Health Maintenance List Health Maintenance  Topic Date Due   Zoster Vaccines- Shingrix (1 of 2) Never done   COVID-19 Vaccine (4 - Booster for Pfizer series) 10/01/2020   INFLUENZA VACCINE  03/26/2021   TETANUS/TDAP  06/26/2021   DEXA SCAN  Completed   PNA vac Low Risk Adult  Completed   HPV VACCINES  Aged Out

## 2021-03-05 ENCOUNTER — Telehealth: Payer: Self-pay | Admitting: Student in an Organized Health Care Education/Training Program

## 2021-03-05 NOTE — Telephone Encounter (Signed)
Letter done and left with the front desk.

## 2021-03-05 NOTE — Telephone Encounter (Signed)
RTC to patient's daughter, [name] [facility]).  She states her mother is moving into another apartment.  Her mother has a small dog for the past 10 years.  States its her therapy dog.  Apartment complex is requesting a letter from her PCP about the dog being an ESA so she can have the dog in the new complex.  Daughter states she can pick up the letter when it is ready  Thank you, SChaplin, RN,BSN

## 2021-03-05 NOTE — Telephone Encounter (Signed)
Pt's daughter requesting a call back about a letter needed for the patient to keep her pet.

## 2021-04-23 ENCOUNTER — Telehealth: Payer: Self-pay | Admitting: *Deleted

## 2021-04-23 NOTE — Telephone Encounter (Signed)
I agree. Thank you.

## 2021-04-23 NOTE — Telephone Encounter (Signed)
Patient's daughter called in stating patient is having slurred speech and walking is "wobbly." Explained patient may be having a stroke and it is imperative that she be seen in ED immediately. She is advised to hang up and dial 911 and explain she is concerned patient is having a stroke. States she will call now.

## 2021-06-20 ENCOUNTER — Telehealth: Payer: Self-pay | Admitting: *Deleted

## 2021-06-20 NOTE — Telephone Encounter (Signed)
Thank you. I will make sure to see her tomorrow as well.

## 2021-06-20 NOTE — Telephone Encounter (Signed)
Call from pt's daughter - states pt "keeps falling". Stated she felled in the bathroom x 2 over the last week. States pt is c/o head, neck, shoulder pain. The daughter has tried to bring pt in sooner to see the doctor but pt refuses. She has noticed bruising on her arms. Also has tried heat/cold and pain creams - which has not helped. The daughter states when pt gets up to walk sometimes her legs "jerks backward"; and has tried to tell pt to sit down but pt will not. Offer an appt for today - the daughter states tomorrow will be better (has to get pt ready). Appt schedule tomorrow w/ Dr Kirke Corin @ 1015 AM.  Daughter inform it pt's symptoms worsen to go to ED - states she understands.

## 2021-06-20 NOTE — Telephone Encounter (Signed)
Thanks for the update. It sounds like she is not doing very well.

## 2021-06-21 ENCOUNTER — Other Ambulatory Visit: Payer: Self-pay

## 2021-06-21 ENCOUNTER — Ambulatory Visit (INDEPENDENT_AMBULATORY_CARE_PROVIDER_SITE_OTHER): Payer: Medicare Other | Admitting: Student

## 2021-06-21 ENCOUNTER — Encounter: Payer: Self-pay | Admitting: Student

## 2021-06-21 VITALS — BP 170/59 | HR 67 | Temp 97.5°F | Ht 65.0 in | Wt 148.0 lb

## 2021-06-21 DIAGNOSIS — M542 Cervicalgia: Secondary | ICD-10-CM

## 2021-06-21 DIAGNOSIS — R748 Abnormal levels of other serum enzymes: Secondary | ICD-10-CM

## 2021-06-21 DIAGNOSIS — R42 Dizziness and giddiness: Secondary | ICD-10-CM

## 2021-06-21 DIAGNOSIS — M47892 Other spondylosis, cervical region: Secondary | ICD-10-CM | POA: Diagnosis not present

## 2021-06-21 DIAGNOSIS — W19XXXA Unspecified fall, initial encounter: Secondary | ICD-10-CM

## 2021-06-21 DIAGNOSIS — Z9181 History of falling: Secondary | ICD-10-CM

## 2021-06-21 DIAGNOSIS — I10 Essential (primary) hypertension: Secondary | ICD-10-CM | POA: Diagnosis not present

## 2021-06-21 DIAGNOSIS — Z23 Encounter for immunization: Secondary | ICD-10-CM

## 2021-06-21 DIAGNOSIS — G44319 Acute post-traumatic headache, not intractable: Secondary | ICD-10-CM

## 2021-06-21 DIAGNOSIS — Z0000 Encounter for general adult medical examination without abnormal findings: Secondary | ICD-10-CM

## 2021-06-21 NOTE — Assessment & Plan Note (Addendum)
Patient presented to clinic accompanied by her granddaughter for evaluation of persistent headache after recent fall. Per granddaughter, patient has had problems with gait imbalance in the past. Last week while she was in the bathtub, she slipped and fell in the tub. Patient reports that she does not remember hitting her head but started having headache right after fall. There were no other noticeable injuries to her head. Few days later, patient had another episode where she slipped and fell backwards in her tub. She has had persistent headache since then described as aching and constant pain in the back of her head. Patient also endorse neck pain and occasional dizziness. She denies any nausea, vomiting, chest pain, slurred speech, weakness or numbness.  A/P: 85 year old lady with a history of cervical osteoarthritis and blindness of the right eye presents to clinic today for evaluation of persistent headache after recent traumatic fall. Patient's headache has not improved since the fall.  She endorsed neck pain and occasional dizziness as well. She is not on any blood thinners. No focal neurological deficits on exam. Patient does have mild tenderness to palpation of the C-spine limited range of motion due to the pain.  Neck pain likely due to neck strain after her recent fall worsening patient's cervical osteoarthritis. In the setting of patient's persistent headache as a result of her mechanical fall, plan to get a CT head to evaluate for possible subdural hematoma and other acute intracranial abnormalities. -- Urgent CT head --Check CMP, CBC and mag -- Reevaluation after CT head -- Advised to take OTC Tylenol as needed for headaches  Addendum: Patient CBC and magnesium within normal limits. CMP shows mild hypokalemia with a K+ of 3.2. Patient will need repeat BMP at next office visit to check improvement in potassium and start on potassium supplementation if still low. -- Patient approved for CT  head, scheduled for imaging later today 10/28, however patient's family were unable to take patient to appointment. CT head appointment was rescheduled to 06/26/2021.

## 2021-06-21 NOTE — Assessment & Plan Note (Signed)
Patient received flu shot today 

## 2021-06-21 NOTE — Assessment & Plan Note (Signed)
BP remains elevated but has slight improvement to systolic in the 170s. Patient reports persistent headache since mechanical fall last week. We will continue to monitor closely. Vitals:   06/21/21 1013 06/21/21 1023  BP: (!) 178/59 (!) 170/59   Plan: -- Continue lisinopril 40 mg daily -- Continue clonidine 0.1 mg twice daily -- Follow-up CMP, mag

## 2021-06-21 NOTE — Patient Instructions (Addendum)
Thank you, Ms.Daneil Dolin for allowing [place] to provide your care today. Today we discussed your recent fall and headache.  We are getting CT scan of your head to rule out any bleeding.  We will refer you to physical therapy if everything comes back normal to help with your balance and gait.  I have ordered the following labs for you:  Lab Orders         CMP14 + Anion Gap         CBC with Diff         Magnesium      I have ordered the following tests: CT head  I will call if any are abnormal. All of your labs can be accessed through "My Chart".   My Chart Access: [email or web address]  Please follow-up in as needed  Please make sure to arrive 15 minutes prior to your next appointment. If you arrive late, you may be asked to reschedule.    We look forward to seeing you next time. Please call our clinic at [phone] if you have any questions or concerns. The best time to call is Monday-Friday from 9am-4pm, but there is someone available 24/7. If after hours or the weekend, call the main hospital number and ask for the Internal Medicine Resident On-Call. If you need medication refills, please notify your pharmacy one week in advance and they will send [place] a request.   Thank you for letting us take part in your care. Wishing you the best!  Steffanie Rainwater, MD 06/21/2021, 11:13 AM IM Resident, PGY-2 [place]ne Heck 41:10

## 2021-06-21 NOTE — Progress Notes (Signed)
   CC: Headache, fall  HPI:  [name] is a 85 y.o. female with PMH as below who presents to the clinic for evaluation of persistent headache after recent fall. Please see problem based charting for evaluation, assessment and plan.  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 Left eye sees shadows and is bothered by bright lights. She wears specialty sunglasses which help her with sensitivity.    CAD (coronary artery disease)    nonobstructive, myoview normal 03/2007, done by Dr. Daleen Squibb - EF = 75%   Dementia (HCC)    Glaucoma    HLD (hyperlipidemia)    Hypertension     Review of Systems:  Constitutional: Negative for fever or fatigue Eyes: Negative for visual changes Respiratory: Negative for shortness of breath Cardiac: Negative for chest pain MSK: Positive for neck pain. Negative for back pain Abdomen: Negative for abdominal pain, constipation or diarrhea Neuro: Positive for headache and occasional dizziness. Negative for slurred speech or weakness  Physical Exam: General: Pleasant, well developed elderly female. No acute distress. HEENT: MMM. No facial injuries. [place]/AT.  Neck: Mild tenderness to palpation of the C-spine. Limited ROM.  No obvious deformities. Cardiac: RRR. No murmurs, rubs or gallops. No LE edema Respiratory: Lungs CTAB. No wheezing or crackles. Abdominal: Soft, symmetric and non tender. Normal BS. Skin: Warm, dry and intact without rashes or lesions MSK: Mild C-spine tenderness. No tenderness to palpation of the L-spine or T-spine. Extremities: Palpable radial and DP pulses. Neuro: A&O x 3. Moves all extremities. Normal sensation. Positive Romberg testing Psych: Appropriate mood and affect.  Vitals:   06/21/21 1013 06/21/21 1023  BP: (!) 178/59 (!) 170/59  Pulse: 69 67  Temp:  (!) 97.5 F (36.4 C)   TempSrc: Oral   SpO2: 100%   Weight: 148 lb (67.1 kg)   Height: 5\' 5"  (1.651 m)      Assessment & Plan:   See Encounters Tab for problem based charting.  Patient discussed with Dr. , MD, MPH

## 2021-06-22 ENCOUNTER — Ambulatory Visit (HOSPITAL_COMMUNITY): Payer: Medicare Other

## 2021-06-22 DIAGNOSIS — R748 Abnormal levels of other serum enzymes: Secondary | ICD-10-CM | POA: Insufficient documentation

## 2021-06-22 LAB — CBC WITH DIFFERENTIAL/PLATELET
Basophils Absolute: 0.1 10*3/uL (ref 0.0–0.2)
Basos: 1 %
EOS (ABSOLUTE): 0 10*3/uL (ref 0.0–0.4)
Eos: 0 %
Hematocrit: 36.5 % (ref 34.0–46.6)
Hemoglobin: 12.6 g/dL (ref 11.1–15.9)
Immature Grans (Abs): 0 10*3/uL (ref 0.0–0.1)
Immature Granulocytes: 0 %
Lymphocytes Absolute: 1.2 10*3/uL (ref 0.7–3.1)
Lymphs: 24 %
MCH: 29.6 pg (ref 26.6–33.0)
MCHC: 34.5 g/dL (ref 31.5–35.7)
MCV: 86 fL (ref 79–97)
Monocytes Absolute: 0.4 10*3/uL (ref 0.1–0.9)
Monocytes: 9 %
Neutrophils Absolute: 3.4 10*3/uL (ref 1.4–7.0)
Neutrophils: 66 %
Platelets: 271 10*3/uL (ref 150–450)
RBC: 4.25 x10E6/uL (ref 3.77–5.28)
RDW: 13.2 % (ref 11.7–15.4)
WBC: 5.1 10*3/uL (ref 3.4–10.8)

## 2021-06-22 LAB — CMP14 + ANION GAP
ALT: 16 IU/L (ref 0–32)
AST: 22 IU/L (ref 0–40)
Albumin/Globulin Ratio: 1.7 (ref 1.2–2.2)
Albumin: 4.1 g/dL (ref 3.6–4.6)
Alkaline Phosphatase: 153 IU/L — ABNORMAL HIGH (ref 44–121)
Anion Gap: 19 mmol/L — ABNORMAL HIGH (ref 10.0–18.0)
BUN/Creatinine Ratio: 9 — ABNORMAL LOW (ref 12–28)
BUN: 8 mg/dL (ref 8–27)
Bilirubin Total: 0.2 mg/dL (ref 0.0–1.2)
CO2: 22 mmol/L (ref 20–29)
Calcium: 8.8 mg/dL (ref 8.7–10.3)
Chloride: 102 mmol/L (ref 96–106)
Creatinine, Ser: 0.87 mg/dL (ref 0.57–1.00)
Globulin, Total: 2.4 g/dL (ref 1.5–4.5)
Glucose: 103 mg/dL — ABNORMAL HIGH (ref 70–99)
Potassium: 3.2 mmol/L — ABNORMAL LOW (ref 3.5–5.2)
Sodium: 143 mmol/L (ref 134–144)
Total Protein: 6.5 g/dL (ref 6.0–8.5)
eGFR: 66 mL/min/{1.73_m2} (ref >59)

## 2021-06-22 LAB — MAGNESIUM: Magnesium: 2 mg/dL (ref 1.6–2.3)

## 2021-06-22 NOTE — Progress Notes (Signed)
Internal Medicine Clinic Attending  I saw and evaluated the patient.  I personally confirmed the key portions of the history and exam documented by Dr. Amponsah and I reviewed pertinent patient test results.  The assessment, diagnosis, and plan were formulated together and I agree with the documentation in the resident's note.  

## 2021-06-22 NOTE — Assessment & Plan Note (Signed)
CMP today showed elevated alkaline phosphate level to 153.  Patient's ALP was elevated to 168 one year ago.  AST/ALT and bilirubin remain within normal limits so unlikely this is hepatic origin.  Her elevated ALP could be secondary to age versus a bone disease. -- Consider repeating to ensure improvement or check GGT to clarify source of elevated ALP.

## 2021-06-26 ENCOUNTER — Ambulatory Visit (HOSPITAL_COMMUNITY)
Admission: RE | Admit: 2021-06-26 | Discharge: 2021-06-26 | Disposition: A | Discharge status: Home / Self Care | Payer: Medicare Other | Source: Ambulatory Visit | Attending: Student in an Organized Health Care Education/Training Program | Admitting: Student in an Organized Health Care Education/Training Program

## 2021-06-26 ENCOUNTER — Other Ambulatory Visit: Payer: Self-pay

## 2021-06-26 DIAGNOSIS — Z043 Encounter for examination and observation following other accident: Secondary | ICD-10-CM | POA: Insufficient documentation

## 2021-06-26 DIAGNOSIS — G44319 Acute post-traumatic headache, not intractable: Secondary | ICD-10-CM | POA: Diagnosis not present

## 2021-06-26 DIAGNOSIS — W19XXXA Unspecified fall, initial encounter: Secondary | ICD-10-CM | POA: Insufficient documentation

## 2021-06-26 MED ORDER — GABAPENTIN 100 MG PO CAPS: ORAL_CAPSULE | ORAL | 3 refills | Status: DC

## 2021-06-26 MED ORDER — LISINOPRIL 40 MG PO TABS
40.0000 mg | ORAL_TABLET | Freq: Every day | ORAL | 3 refills | Status: DC
Start: 2021-06-26 — End: 2021-12-28

## 2021-06-26 MED ORDER — CLONIDINE HCL 0.1 MG PO TABS
0.1000 mg | ORAL_TABLET | Freq: Two times a day (BID) | ORAL | 3 refills | Status: DC
Start: 2021-06-26 — End: 2021-12-28

## 2021-07-02 ENCOUNTER — Ambulatory Visit (INDEPENDENT_AMBULATORY_CARE_PROVIDER_SITE_OTHER): Payer: Medicare Other | Admitting: Student in an Organized Health Care Education/Training Program

## 2021-07-02 ENCOUNTER — Encounter: Payer: Self-pay | Admitting: Student in an Organized Health Care Education/Training Program

## 2021-07-02 ENCOUNTER — Encounter: Payer: TRICARE For Life (TFL) | Admitting: Student in an Organized Health Care Education/Training Program

## 2021-07-02 VITALS — BP 190/73 | HR 66 | Temp 98.5°F | Ht 65.0 in | Wt 148.7 lb

## 2021-07-02 DIAGNOSIS — I10 Essential (primary) hypertension: Secondary | ICD-10-CM | POA: Diagnosis not present

## 2021-07-02 DIAGNOSIS — Z9181 History of falling: Secondary | ICD-10-CM | POA: Diagnosis not present

## 2021-07-02 DIAGNOSIS — E876 Hypokalemia: Secondary | ICD-10-CM

## 2021-07-02 MED ORDER — AMLODIPINE BESYLATE 5 MG PO TABS: 5.0000 mg | ORAL_TABLET | Freq: Every day | ORAL | 3 refills | Status: DC

## 2021-07-02 MED ORDER — POTASSIUM CHLORIDE CRYS ER 20 MEQ PO TBCR: 20.0000 meq | EXTENDED_RELEASE_TABLET | Freq: Every day | ORAL | 1 refills | Status: DC

## 2021-07-02 NOTE — Progress Notes (Signed)
   Assessment and Plan:  See Encounters tab for problem-based medical decision making.   __________________________________________________________  HPI:   85 year old person living with hypertension and vision impairment here for follow-up after a fall 2 weeks ago.  Accompanied by her daughter.  Patient's lives independently, completes all her own activities of daily living.  No further falls since 2 weeks ago.  Headache has resolved, still with some achiness in her neck and shoulders.  Reports some occasional sensations of muscle spasm and cramps that threw her off balance every once in a while.  Denies any recent illness, reports adherence with her medications, uses a pillbox.  Denies any headache or chest pain or dyspnea on exertion.  __________________________________________________________  Problem List: Patient Active Problem List   Diagnosis Date Noted   At high risk for falls 04/15/2016    Priority: High   Cervical osteoarthritis 02/17/2017    Priority: Medium    Mild cognitive impairment 08/05/2016    Priority: Medium    Stress incontinence 06/20/2016    Priority: Medium    Calcification of aorta (HCC) 04/15/2016    Priority: Medium    Glaucoma 04/22/2007    Priority: Medium    Essential hypertension 04/22/2007    Priority: Medium    Hyperlipidemia 05/25/2019    Priority: Low   Hypokalemia 03/07/2016    Priority: Low   Health care maintenance 09/21/2014    Priority: Low   Blindness of right eye with low vision in contralateral eye 06/27/2011    Priority: Low    Medications: Reconciled today in Epic __________________________________________________________  Physical Exam:  Vital Signs: Vitals:   07/02/21 1022 07/02/21 1104  BP: (!) 181/84 (!) 190/73  Pulse: 75 66  Temp: 98.5 F (36.9 C)   TempSrc: Oral   SpO2: 100%   Weight: 148 lb 11.2 oz (67.4 kg)   Height: 5\' 5"  (1.651 m)     Gen: Well appearing, NAD CV: RRR, 3 out of 6 early systolic murmur at  the right upper sternal border Pulm: Normal effort, CTA throughout, no wheezing Abd: Soft, NT, ND Ext: Warm, no edema, normal joints Skin: No atypical appearing moles. No rashes

## 2021-07-02 NOTE — Assessment & Plan Note (Signed)
Blood pressure historically difficult to control due to inconsistency with medications.  Severely elevated today, asymptomatic.  Has a history of some hypotension with additional medications in the past, but I think the severity of the hypertension today necessitates a change.  We will try amlodipine 5 mg daily in addition to chronic lisinopril 40 and clonidine 0.1 mg twice daily.  Follow-up in 3 months for blood pressure recheck.

## 2021-07-02 NOTE — Assessment & Plan Note (Signed)
History of hypokalemia that I thought was resolved last year.  Potassium now down to 3.2, having some symptoms of muscle cramping and twitchiness which may be contributing to her risk of falls.  We will try a low-dose of potassium 20 mill equivalents daily.  Need to be careful here given that she is also on full dose lisinopril.  Will recheck labs at next visit.

## 2021-07-02 NOTE — Assessment & Plan Note (Signed)
Last follow-up about 2 weeks ago.  Etiology is multifactorial, due to advanced age, deconditioning, and vision impairments.  No central acting medications.  No history of osteoporosis.  She had a CT head last week which ruled out subdural hematoma from the prior fall.  We talked about strategies to reduce risk of falls in the future.  Decided to refer for outpatient physical therapy to work on gait training, balance, and assess for any medical equipment needs.

## 2021-07-09 ENCOUNTER — Encounter: Payer: TRICARE For Life (TFL) | Admitting: Student in an Organized Health Care Education/Training Program

## 2021-07-10 ENCOUNTER — Ambulatory Visit: Payer: Medicare Other | Attending: Student in an Organized Health Care Education/Training Program

## 2021-07-10 ENCOUNTER — Other Ambulatory Visit: Payer: Self-pay

## 2021-07-10 DIAGNOSIS — R2681 Unsteadiness on feet: Secondary | ICD-10-CM | POA: Insufficient documentation

## 2021-07-10 DIAGNOSIS — Z9181 History of falling: Secondary | ICD-10-CM | POA: Diagnosis not present

## 2021-07-10 DIAGNOSIS — M6281 Muscle weakness (generalized): Secondary | ICD-10-CM | POA: Insufficient documentation

## 2021-07-10 DIAGNOSIS — R2689 Other abnormalities of gait and mobility: Secondary | ICD-10-CM | POA: Diagnosis not present

## 2021-07-10 NOTE — Patient Instructions (Signed)
Access Code: [email or web address] URL: [email or web address] Date: 07/10/2021 Prepared by: Gustavus Bryant  Exercises Supine Bridge - 2 x daily - 7 x weekly - 2 sets - 10 reps Sidelying Hip Abduction - 2 x daily - 7 x weekly - 2 sets - 10 reps Sit to Stand with Armchair - 2 x daily - 7 x weekly - 1 sets - 5 reps

## 2021-07-10 NOTE — Therapy (Addendum)
[facility] [address] [place], [place], [address] Phone: [phone]   Fax:  [phone]  Physical Therapy Evaluation  Patient Details  Name: [name] MRN: [id number] Date of Birth: 03/05/1936 Referring Provider (PT): Tyson Alias, MD   Encounter Date: 07/10/2021   PT End of Session - 07/10/21 1007     Visit Number 1    Number of Visits 5    Date for PT Re-Evaluation 08/21/21    Authorization Type [employer]    Authorization Time Period 07/10/21-08/21/21    Progress Note Due on Visit 10    PT Start Time 1015    PT Stop Time 1100    PT Time Calculation (min) 45 min    Equipment Utilized During Treatment Gait belt    Activity Tolerance Patient tolerated treatment well    Behavior During Therapy [facility] for tasks assessed/performed             Past Medical History:  Diagnosis Date   Arthritis    Blindness of right eye with low vision in contralateral eye 06/27/2011   Patient right eye is enucleated due to end stage glaucoma and she has a prosthesis in place. Left eye sees shadows and is bothered by bright lights. She wears specialty sunglasses which help her with sensitivity.    CAD (coronary artery disease)    nonobstructive, myoview normal 03/2007, done by Dr. Daleen Squibb - EF = 75%   Dementia (HCC)    Glaucoma    HLD (hyperlipidemia)    Hypertension     Past Surgical History:  Procedure Laterality Date   CARDIAC CATHETERIZATION     CHOLECYSTECTOMY N/A 05/04/2020   Procedure: LAPAROSCOPIC CHOLECYSTECTOMY WITH INTRAOPERATIVE CHOLANGIOGRAM;  Surgeon: Griselda Miner, MD;  Location: [facility] OR;  Service: General;  Laterality: N/A;   DILATION AND CURETTAGE OF UTERUS     ERCP N/A 05/05/2020   Procedure: ENDOSCOPIC RETROGRADE CHOLANGIOPANCREATOGRAPHY (ERCP);  Surgeon: Jeani Hawking, MD;  Location: [facility] ENDOSCOPY;  Service: Endoscopy;  Laterality: N/A;   EYE SURGERY     OCULAR PROSTHESIS REMOVAL     Left eye     REMOVAL OF STONES  05/05/2020   Procedure: REMOVAL OF STONES;  Surgeon: Jeani Hawking, MD;  Location: [facility] ENDOSCOPY;  Service: Endoscopy;;   SPHINCTEROTOMY  05/05/2020   Procedure: Dennison Mascot;  Surgeon: Jeani Hawking, MD;  Location: [facility] ENDOSCOPY;  Service: Endoscopy;;   TONSILLECTOMY      There were no vitals filed for this visit.   Subjective Assessment - 07/10/21 1017     Subjective Reports a history of global stiffness and weakness in both legs, has fallen 2x in 6 months but cannot recall mechanism.  Blind in R eye, L eye vision impaired, legally blind.  Agreeable to PT at family's urging.    Pertinent History 85 year old person living with hypertension and vision impairment here for follow-up after a fall 2 weeks ago.  Accompanied by her daughter.  Patient's lives independently, completes all her own activities of daily living.  No further falls since 2 weeks ago.  Headache has resolved, still with some achiness in her neck and shoulders.  Reports some occasional sensations of muscle spasm and cramps that threw her off balance every once in a while.  Denies any recent illness, reports adherence with her medications, uses a pillbox.  Denies any headache or chest pain or dyspnea on exertion.    Limitations Walking    How long can you sit comfortably? >30 min  How long can you stand comfortably? >15 min    How long can you walk comfortably? >15    Patient Stated Goals To get stronger    Currently in Pain? Yes    Pain Score 4     Pain Orientation Right;Left                [facility] PT Assessment - 07/10/21 0001       Assessment   Medical Diagnosis gait disorder    Referring Provider (PT) Tyson Alias, MD    Onset Date/Surgical Date 07/02/21    Prior Therapy [facility]      Precautions   Precautions Fall    Precaution Comments patient has dimished vision      Restrictions   Weight Bearing Restrictions No      Balance Screen   Has the patient fallen in the past 6 months  Yes    How many times? 2    Has the patient had a decrease in activity level because of a fear of falling?  Yes    Is the patient reluctant to leave their home because of a fear of falling?  Yes      Home Environment   Living Environment Private residence    Living Arrangements Alone    Available Help at Discharge Family    Type of Home House    Home Access Stairs to enter    Entrance Stairs-Number of Steps 3    Home Layout Two level      Prior Function   Level of Independence Independent      Sensation   Light Touch Appears Intact      Coordination   Gross Motor Movements are Fluid and Coordinated Yes      Strength   Right/Left Hip Right;Left    Right Hip ABduction 3+/5    Left Hip ABduction 4/5      Transfers   Transfers Sit to Stand;Stand to Sit;Squat Pivot Transfers    Sit to Stand 6: Modified independent (Device/Increase time)    Five time sit to stand comments  17.2s    Stand to Sit 6: Modified independent (Device/Increase time)    Squat Pivot Transfers 6: Modified independent (Device/Increase time)      Berg Balance Test   Sit to Stand Able to stand  independently using hands    Standing Unsupported Able to stand safely 2 minutes    Sitting with Back Unsupported but Feet Supported on Floor or Stool Able to sit safely and securely 2 minutes    Stand to Sit Controls descent by using hands    Transfers Able to transfer safely, definite need of hands    Standing Unsupported with Eyes Closed Able to stand 10 seconds safely    Standing Unsupported with Feet Together Able to place feet together independently and stand 1 minute safely    From Standing, Reach Forward with Outstretched Arm Can reach forward >12 cm safely (5")    From Standing Position, Pick up Object from Floor Able to pick up shoe safely and easily    From Standing Position, Turn to Look Behind Over each Shoulder Looks behind from both sides and weight shifts well    Turn 360 Degrees Able to turn 360  degrees safely but slowly    Standing Unsupported, Alternately Place Feet on Step/Stool Able to stand independently and safely and complete 8 steps in 20 seconds    Standing Unsupported, One Foot in Front Able to  plae foot ahead of the other independently and hold 30 seconds    Standing on One Leg Tries to lift leg/unable to hold 3 seconds but remains standing independently    Total Score 46      High Level Balance   High Level Balance Comments able to maintain all 4 positions on mCTSIB                                    PT Education - 07/10/21 1008     Education Details discussed eval findings, rehab prognosis and POC    Person(s) Educated Patient    Methods Explanation    Comprehension Verbalized understanding              PT Short Term Goals - 07/10/21 1202       PT SHORT TERM GOAL #1   Title STGs=LTGs               PT Long Term Goals - 07/10/21 1202       PT LONG TERM GOAL #1   Title Patient will perform updated/finalized HEP, with family assist to address weakness and decreased balance    Baseline [id number]    Time 4    Period Weeks    Status New    Target Date 08/14/21      PT LONG TERM GOAL #2   Title Pt will improve 5xSTS test to 15 sec to show improved leg strength and balance.    Baseline 17.2s with UE support    Time 4    Period Weeks    Status New    Target Date 08/21/21      PT LONG TERM GOAL #3   Title Patient will improve Berg Balance score by 6 points to 52 demonstrating reduced fall risk    Baseline BERG score 46/56    Time 4    Period Weeks    Status New    Target Date 08/21/21      PT LONG TERM GOAL #4   Title Increase R hip abduction strengh to 4/5    Baseline R hip abduction strength 3+/5    Time 4    Period Weeks    Status New    Target Date 08/21/21                   Plan - 07/10/21 1212     Clinical Impression Statement Patient referred to [facility] due to progressive LE weakness resulting  in 2 falls in 6 months.  Patient is legally blind and lives alone with daily family support as needed.  She is able to transfer and ambulate with Mod I but weakness evident by need of UE support to stand.  5xSTS and BERG show mild strength and balance deficits, no issues with mCTSIB.  She is able to stair climb ambulate w/o AD but relates she does not get out of the house much by her own choice.    Personal Factors and Comorbidities Comorbidity 1    Examination-Activity Limitations Stairs;Transfers    Examination-Participation Restrictions Community Activity    Stability/Clinical Decision Making Stable/Uncomplicated    Clinical Decision Making Low    Rehab Potential Good    PT Frequency 1x / week    PT Duration 4 weeks    PT Treatment/Interventions ADLs/Self Care Home Management;DME Instruction;Gait training;Stair training;Functional mobility training;Therapeutic activities;Therapeutic exercise;Balance training;Neuromuscular re-education;Patient/family education  PT Next Visit Plan HEP update, B hip strengthening, assess DGI    PT Home Exercise Plan [email or web address]    Consulted and Agree with Plan of Care Patient             Patient will benefit from skilled therapeutic intervention in order to improve the following deficits and impairments:  Decreased activity tolerance, Decreased strength, Decreased balance, Decreased mobility, Difficulty walking, Impaired vision/preception  Visit Diagnosis: Other abnormalities of gait and mobility  Unsteadiness on feet  Muscle weakness (generalized)     Problem List Patient Active Problem List   Diagnosis Date Noted   Hyperlipidemia 05/25/2019   Cervical osteoarthritis 02/17/2017   Mild cognitive impairment 08/05/2016   Stress incontinence 06/20/2016   Calcification of aorta (HCC) 04/15/2016   At high risk for falls 04/15/2016   Hypokalemia 03/07/2016   Health care maintenance 09/21/2014   Blindness of right eye with low vision in  contralateral eye 06/27/2011   Glaucoma 04/22/2007   Essential hypertension 04/22/2007    Hildred Laser, PT 07/10/2021, 12:25 PM  [facility] [address] [place], [place], [address] Phone: [phone]   Fax:  [phone]  Name: Adaira Centola MRN: [id number] Date of Birth: May 16, 1936

## 2021-07-16 ENCOUNTER — Ambulatory Visit: Payer: Medicare Other

## 2021-07-24 ENCOUNTER — Ambulatory Visit: Payer: Medicare Other

## 2021-07-24 ENCOUNTER — Other Ambulatory Visit: Payer: Self-pay

## 2021-07-24 DIAGNOSIS — R2681 Unsteadiness on feet: Secondary | ICD-10-CM

## 2021-07-24 DIAGNOSIS — M6281 Muscle weakness (generalized): Secondary | ICD-10-CM

## 2021-07-24 DIAGNOSIS — R2689 Other abnormalities of gait and mobility: Secondary | ICD-10-CM

## 2021-07-24 NOTE — Therapy (Signed)
[facility] [address] [place], [place], [address] Phone: [phone]   Fax:  [phone]  Physical Therapy Treatment/DC Summary  Patient Details  Name: [name] MRN: [id number] Date of Birth: 12-22-1935 Referring Provider (PT): Axel Filler, MD   Encounter Date: 07/24/2021  PHYSICAL THERAPY DISCHARGE SUMMARY  Visits from Start of Care: 2  Current functional level related to goals / functional outcomes: Goals met   Remaining deficits: Endurance and vision loss   Education / Equipment: HEP   Patient agrees to discharge. Patient goals were met. Patient is being discharged due to being pleased with the current functional level.    PT End of Session - 07/24/21 1538     Visit Number 2    Number of Visits 5    Date for PT Re-Evaluation 08/21/21    Authorization Type BCBS    Authorization Time Period 07/10/21-08/21/21    Progress Note Due on Visit 10    PT Start Time 1535    PT Stop Time 1615    PT Time Calculation (min) 40 min    Equipment Utilized During Treatment Gait belt    Activity Tolerance Patient tolerated treatment well    Behavior During Therapy WFL for tasks assessed/performed             Past Medical History:  Diagnosis Date   Arthritis    Blindness of right eye with low vision in contralateral eye 06/27/2011   Patient right eye is enucleated due to end stage glaucoma and she has a prosthesis in place. Left eye sees shadows and is bothered by bright lights. She wears specialty sunglasses which help her with sensitivity.    CAD (coronary artery disease)    nonobstructive, myoview normal 03/2007, done by Dr. Verl Blalock - EF = 75%   Dementia ([place])    Glaucoma    HLD (hyperlipidemia)    Hypertension     Past Surgical History:  Procedure Laterality Date   CARDIAC CATHETERIZATION     CHOLECYSTECTOMY N/A 05/04/2020   Procedure: LAPAROSCOPIC CHOLECYSTECTOMY WITH INTRAOPERATIVE  CHOLANGIOGRAM;  Surgeon: Jovita Kussmaul, MD;  Location: [facility];  Service: General;  Laterality: N/A;   DILATION AND CURETTAGE OF UTERUS     ERCP N/A 05/05/2020   Procedure: ENDOSCOPIC RETROGRADE CHOLANGIOPANCREATOGRAPHY (ERCP);  Surgeon: Carol Ada, MD;  Location: [facility];  Service: Endoscopy;  Laterality: N/A;   EYE SURGERY     OCULAR PROSTHESIS REMOVAL     Left eye    REMOVAL OF STONES  05/05/2020   Procedure: REMOVAL OF STONES;  Surgeon: Carol Ada, MD;  Location: [facility] ENDOSCOPY;  Service: Endoscopy;;   SPHINCTEROTOMY  05/05/2020   Procedure: Joan Mayans;  Surgeon: Carol Ada, MD;  Location: [facility];  Service: Endoscopy;;   TONSILLECTOMY      There were no vitals filed for this visit.   Subjective Assessment - 07/24/21 1537     Subjective Rates herself at 89%    Pertinent History 85 year old person living with hypertension and vision impairment here for follow-up after a fall 2 weeks ago.  Accompanied by her daughter.  Patient's lives independently, completes all her own activities of daily living.  No further falls since 2 weeks ago.  Headache has resolved, still with some achiness in her neck and shoulders.  Reports some occasional sensations of muscle spasm and cramps that threw her off balance every once in a while.  Denies any recent illness, reports adherence with her medications, uses a  pillbox.  Denies any headache or chest pain or dyspnea on exertion.    Limitations Walking    How long can you sit comfortably? >30 min    How long can you stand comfortably? >15 min    How long can you walk comfortably? >15    Patient Stated Goals To get stronger                [facility] PT Assessment - 07/24/21 0001       Strength   Right Hip ABduction 4/5    Left Hip ABduction 4/5      Berg Balance Test   Sit to Stand Able to stand without using hands and stabilize independently    Standing Unsupported Able to stand safely 2 minutes    Sitting with Back Unsupported  but Feet Supported on Floor or Stool Able to sit safely and securely 2 minutes    Stand to Sit Sits safely with minimal use of hands    Transfers Able to transfer safely, minor use of hands    Standing Unsupported with Eyes Closed Able to stand 10 seconds safely    Standing Unsupported with Feet Together Able to place feet together independently and stand 1 minute safely    From Standing, Reach Forward with Outstretched Arm Can reach confidently >25 cm (10")    From Standing Position, Pick up Object from Floor Able to pick up shoe safely and easily    From Standing Position, Turn to Look Behind Over each Shoulder Looks behind from both sides and weight shifts well    Turn 360 Degrees Able to turn 360 degrees safely but slowly    Standing Unsupported, Alternately Place Feet on Step/Stool Able to stand independently and safely and complete 8 steps in 20 seconds    Standing Unsupported, One Foot in Front Able to plae foot ahead of the other independently and hold 30 seconds    Standing on One Leg Able to lift leg independently and hold equal to or more than 3 seconds    Total Score 51             See goal section for treatment details                         PT Short Term Goals - 07/10/21 1202       PT SHORT TERM GOAL #1   Title STGs=LTGs               PT Long Term Goals - 07/24/21 1541       PT LONG TERM GOAL #1   Title Patient will perform updated/finalized HEP, with family assist to address weakness and decreased balance    Baseline [id number]; 07/24/21 Reviewed in clinic today    Time 4    Period Weeks    Status Achieved      PT LONG TERM GOAL #2   Title Pt will improve 5xSTS test to 15 sec to show improved leg strength and balance.    Baseline 17.2s with UE support; 11//29/22 5x STS in 14.6s    Time 4    Period Weeks    Status Achieved      PT LONG TERM GOAL #3   Title Patient will improve Berg Balance score by 6 points to 52 demonstrating  reduced fall risk    Baseline BERG score 46/56; 07/24/21 BERG score 51/56 most likely maximum score due to visit  Time 4    Period Weeks    Status Achieved      PT LONG TERM GOAL #4   Title Increase R hip abduction strengh to 4/5    Baseline R hip abduction strength 3+/5; 07/24/21 R hip abduction 4/5    Time 4    Period Weeks    Status Achieved                   Plan - 07/24/21 1613     Clinical Impression Statement Patient rates herself at 89% functional and denies loss of ADL and mobility function.  Goals have been met and she fells ready for self management    Personal Factors and Comorbidities Comorbidity 1    Examination-Activity Limitations Stairs;Transfers    Examination-Participation Restrictions Community Activity    Stability/Clinical Decision Making Stable/Uncomplicated    Rehab Potential Good    PT Frequency 1x / week    PT Duration 4 weeks    PT Treatment/Interventions ADLs/Self Care Home Management;DME Instruction;Gait training;Stair training;Functional mobility training;Therapeutic activities;Therapeutic exercise;Balance training;Neuromuscular re-education;Patient/family education    PT Next Visit Plan DC    PT Home Exercise Plan [id number]    Consulted and Agree with Plan of Care Patient             Patient will benefit from skilled therapeutic intervention in order to improve the following deficits and impairments:  Decreased activity tolerance, Decreased strength, Decreased balance, Decreased mobility, Difficulty walking, Impaired vision/preception  Visit Diagnosis: Other abnormalities of gait and mobility  Unsteadiness on feet  Muscle weakness (generalized)     Problem List Patient Active Problem List   Diagnosis Date Noted   Hyperlipidemia 05/25/2019   Cervical osteoarthritis 02/17/2017   Mild cognitive impairment 08/05/2016   Stress incontinence 06/20/2016   Calcification of aorta ([place]) 04/15/2016   At high risk for falls  04/15/2016   Hypokalemia 03/07/2016   Health care maintenance 09/21/2014   Blindness of right eye with low vision in contralateral eye 06/27/2011   Glaucoma 04/22/2007   Essential hypertension 04/22/2007    Lanice Shirts, PT 07/24/2021, 4:15 PM  [facility] [address] [place], [place], [address] Phone: [phone]   Fax:  [phone]  Name: Sissy Goetzke MRN: [id number] Date of Birth: 02/28/1936

## 2021-08-03 ENCOUNTER — Ambulatory Visit: Payer: Medicare Other

## 2021-08-10 ENCOUNTER — Ambulatory Visit: Payer: Medicare Other

## 2021-10-08 ENCOUNTER — Encounter: Payer: Medicare Other | Admitting: Student in an Organized Health Care Education/Training Program

## 2021-10-26 ENCOUNTER — Ambulatory Visit (INDEPENDENT_AMBULATORY_CARE_PROVIDER_SITE_OTHER): Payer: Medicare Other | Admitting: Internal Medicine

## 2021-10-26 ENCOUNTER — Encounter: Payer: Self-pay | Admitting: Internal Medicine

## 2021-10-26 DIAGNOSIS — Z0000 Encounter for general adult medical examination without abnormal findings: Secondary | ICD-10-CM

## 2021-10-26 NOTE — Progress Notes (Signed)
Subjective:   [name] is a 86 y.o. female who presents for an Initial Medicare Annual Wellness Visit. I connected with  [name] on 10/26/21 by a audio enabled telemedicine application and verified that I am speaking with the correct person using two identifiers.  Patient Location: Home  Provider Location: Office/Clinic  I discussed the limitations of evaluation and management by telemedicine. The patient expressed understanding and agreed to proceed.   Review of Systems    Defer to PCP       Objective:    There were no vitals filed for this visit. There is no height or weight on file to calculate BMI.  Advanced Directives 10/26/2021 07/24/2021 07/02/2021 06/21/2021 07/03/2020 06/12/2020 05/05/2020  Does Patient Have a Medical Advance Directive? Yes Yes No No No Yes Yes  Type of Advance Directive - - - - - Midwife;Living will Healthcare Power of Attorney  Does patient want to make changes to medical advance directive? - No - Patient declined - - - No - Patient declined No - Patient declined  Copy of Healthcare Power of Attorney in Chart? - - - - - No - copy requested No - copy requested  Would patient like information on creating a medical advance directive? - - No - Patient declined Yes (ED - Information included in AVS) - - -    Current Medications (verified) Outpatient Encounter Medications as of 10/26/2021  Medication Sig   amLODipine (NORVASC) 5 MG tablet Take 1 tablet (5 mg total) by mouth daily.   cloNIDine (CATAPRES) 0.1 MG tablet Take 1 tablet (0.1 mg total) by mouth 2 (two) times daily.   donepezil (ARICEPT) 5 MG tablet Take 1 tablet (5 mg total) by mouth at bedtime.   gabapentin (NEURONTIN) 100 MG capsule TAKE 2 CAPSULES BY MOUTH IN THE MORNING, AND 4 CAPSULES AT BEDTIME   lisinopril (ZESTRIL) 40 MG tablet Take 1 tablet (40 mg total) by mouth daily.   potassium chloride SA (KLOR-CON) 20 MEQ tablet Take 1 tablet (20 mEq total) by mouth  daily.   albuterol (PROVENTIL HFA) 108 (90 Base) MCG/ACT inhaler Inhale 1 puff into the lungs every 6 (six) hours as needed for wheezing or shortness of breath. (Patient not taking: Reported on 07/03/2020)   ALPHAGAN P 0.1 % SOLN NEW PRESCRIPTION REQUEST: INSTILL ONE DROP IN THE LEFT EYE TWICE DAILY (Patient not taking: Reported on 10/26/2021)   bimatoprost (LUMIGAN) 0.03 % ophthalmic solution Place 1 drop into the left eye at bedtime.   (Patient not taking: Reported on 10/26/2021)   nitroGLYCERIN (NITROSTAT) 0.4 MG SL tablet NEW PRESCRIPTION REQUEST: DISSOLVE 1 TABLET UNDER THE TONGUE EVERY 5 MINUTES AS NEEDED FOR CHEST PAIN. DO NOT EXCEED A TOTAL OF 3 DOSES IN 15 MINUTES. (Patient not taking: Reported on 10/26/2021)   pravastatin (PRAVACHOL) 20 MG tablet Take 1 tablet (20 mg total) by mouth daily. (Patient not taking: Reported on 07/03/2020)   No facility-administered encounter medications on file as of 10/26/2021.    Allergies (verified) Aspirin and Codeine   History: Past Medical History:  Diagnosis Date   Arthritis    Blindness of right eye with low vision in contralateral eye 06/27/2011   Patient right eye is enucleated due to end stage glaucoma and she has a prosthesis in place. Left eye sees shadows and is bothered by bright lights. She wears specialty sunglasses which help her with sensitivity.    CAD (coronary artery disease)    nonobstructive, myoview normal  03/2007, done by Dr. Daleen Tapia - EF = 75%   Dementia ([facility])    Glaucoma    HLD (hyperlipidemia)    Hypertension    Past Surgical History:  Procedure Laterality Date   CARDIAC CATHETERIZATION     CHOLECYSTECTOMY N/A 05/04/2020   Procedure: LAPAROSCOPIC CHOLECYSTECTOMY WITH INTRAOPERATIVE CHOLANGIOGRAM;  Surgeon: Diamond Mineroth, Diamond III, MD;  [facility];  Service: General;  Laterality: N/A;   DILATION AND CURETTAGE OF UTERUS     ERCP N/A 05/05/2020   Procedure: ENDOSCOPIC RETROGRADE CHOLANGIOPANCREATOGRAPHY (ERCP);  Surgeon: Diamond HawkingHung, Patrick, MD;   [facility];  Service: Endoscopy;  Laterality: N/A;   EYE SURGERY     OCULAR PROSTHESIS REMOVAL     Left eye    REMOVAL OF STONES  05/05/2020   Procedure: REMOVAL OF STONES;  Surgeon: Diamond HawkingHung, Patrick, MD;  [facility];  Service: Endoscopy;;   SPHINCTEROTOMY  05/05/2020   Procedure: Dennison MascotSPHINCTEROTOMY;  Surgeon: Diamond HawkingHung, Patrick, MD;  [facility];  Service: Endoscopy;;   TONSILLECTOMY     Family History  Problem Relation Age of Onset   Stroke Neg Hx    Cancer Neg Hx    Social History   Socioeconomic History   Marital status: Widowed    Spouse name: Not on file   Number of children: 2   Years of education: Not on file   Highest education level: 12th grade  Occupational History   Occupation: retired  Tobacco Use   Smoking status: Former    Types: Cigarettes   Smokeless tobacco: Never  Building services engineerVaping Use   Vaping Use: Never used  Substance and Sexual Activity   Alcohol use: No   Drug use: No   Sexual activity: Not Currently  Other Topics Concern   Not on file  Social History Narrative   Has a son and a daughter, granddaughter spends a lot of time at her house. Sister and she live together for many years. Sister was living with her mother and [name] was married but the mother passed away and her husband passed away so they moved in together.      Pt is right handed, widowed, and  just recently moved out of her 2 story house into a 1st floor apartment.  She drinks 2 glasses of green tea daily, and is somewhat active, though is limited by her inability to see. Pt previously ran an adult day care center.    Social Determinants of Health   Financial Resource Strain: Low Risk    Difficulty of Paying Living Expenses: Not very hard  Food Insecurity: No Food Insecurity   Worried About Programme researcher, broadcasting/film/videounning Out of Food in the Last Year: Never true   Ran Out of Food in the Last Year: Never true  Transportation Needs: No Transportation Needs   Lack of Transportation (Medical):  No   Lack of Transportation (Non-Medical): No  Physical Activity: Inactive   Days of Exercise per Week: 0 days   Minutes of Exercise per Session: 0 min  Stress: No Stress Concern Present   Feeling of Stress : Not at all  Social Connections: Socially Isolated   Frequency of Communication with Friends and Family: More than three times a week   Frequency of Social Gatherings with Friends and Family: Never   Attends Religious Services: Never   Database administratorActive Member of Clubs or Organizations: No   Attends BankerClub or Organization Meetings: Never   Marital Status: Widowed    Tobacco Counseling Counseling given: Not Answered  Clinical Intake:  Pre-visit preparation completed: Yes  Pain : No/denies pain     Nutritional Risks: None Diabetes: No  How often do you need to have someone help you when you read instructions, pamphlets, or other written materials from your doctor or pharmacy?: 3 - Sometimes What is the last grade level you completed in school?: 12th grade  Diabetic?No  Interpreter Needed?: No  Information entered by :: Cala Bradford CMA   Activities of Daily Living In your present state of health, do you have any difficulty performing the following activities: 10/26/2021 07/02/2021  Hearing? N N  Vision? Y Y  Difficulty concentrating or making decisions? N Y  Walking or climbing stairs? N Y  Dressing or bathing? N N  Doing errands, shopping? Y Y  Some recent data might be hidden    Patient Care Team: Tyson Alias, MD as PCP - General (Internal Medicine)  Indicate any recent Medical Services you may have received from other than [facility] providers in the past year (date may be approximate).     Assessment:   This is a routine wellness examination for [place].  Hearing/Vision screen No results found.  Dietary issues and exercise activities discussed:     Goals Addressed   None    Depression Screen PHQ 2/9 Scores 10/26/2021 07/03/2021 06/21/2021 06/12/2020  03/06/2020 11/03/2018 09/21/2018  PHQ - 2 Score 2 0 3 4 4 2 6   PHQ- 9 Score 3 - 9 15 15 14 15     Fall Risk Fall Risk  10/26/2021 07/02/2021 06/21/2021 06/12/2020 03/06/2020  Falls in the past year? 1 1 1  0 0  Number falls in past yr: 0 1 1 0 -  Injury with Fall? 0 1 1 0 -  Comment - - - - -  Risk Factor Category  - - - - -  Risk for fall due to : Impaired vision Impaired vision;Impaired balance/gait Impaired mobility Impaired vision Impaired vision  Risk for fall due to: Comment - - - - -  Follow up Falls evaluation completed Falls evaluation completed Falls evaluation completed;Falls prevention discussed - Falls prevention discussed    FALL RISK PREVENTION PERTAINING TO THE HOME:  Any stairs in or around the home? Yes  If so, are there any without handrails? Yes  Home free of loose throw rugs in walkways, pet beds, electrical cords, etc? No  Adequate lighting in your home to reduce risk of falls? Yes   ASSISTIVE DEVICES UTILIZED TO PREVENT FALLS:  Life alert? Yes  Use of a cane, walker or w/c? No Grab bars in the bathroom? Yes  Shower chair or bench in shower? Yes  Elevated toilet seat or a handicapped toilet? No   TIMED UP AND GO:   Was the test performed?  N/A .  Length of time to ambulate 10 feet: N/A sec.     Cognitive Function:     6CIT Screen 10/26/2021  What Year? 0 points  What month? 0 points  What time? 0 points  Count back from 20 2 points  Months in reverse 0 points  Repeat phrase 0 points  Total Score 2    Immunizations Immunization History  Administered Date(s) Administered   Fluad Quad(high Dose 65+) 06/21/2021   H1N1 10/10/2008   Influenza Split 06/27/2011, 05/08/2012   Influenza Whole 06/10/2008, 05/25/2009   Influenza,inj,Quad PF,6+ Mos 05/03/2014, 04/15/2016, 05/19/2017, 09/21/2018, 06/12/2020   PFIZER(Purple Top)SARS-COV-2 Vaccination 09/12/2019, 10/03/2019, 05/31/2020   Pneumococcal Conjugate-13 09/20/2014   Pneumococcal Polysaccharide-23  06/27/2011  Tdap 06/27/2011, 07/02/2021    TDAP status: Up to date  Flu Vaccine status: Up to date  Pneumococcal vaccine status: Up to date  Covid-19 vaccine status: Information provided on how to obtain vaccines.   Qualifies for Shingles Vaccine? Yes   Zostavax completed No   Shingrix Completed?: No.    Education has been provided regarding the importance of this vaccine. Patient has been advised to call insurance company to determine out of pocket expense if they have not yet received this vaccine. Advised may also receive vaccine at local pharmacy or Health Dept. Verbalized acceptance and understanding.  Screening Tests Health Maintenance  Topic Date Due   Zoster Vaccines- Shingrix (1 of 2) Never done   COVID-19 Vaccine (4 - Booster for Pfizer series) 07/26/2020   TETANUS/TDAP  07/03/2031   Pneumonia Vaccine 63+ Years old  Completed   INFLUENZA VACCINE  Completed   DEXA SCAN  Completed   HPV VACCINES  Aged Out    Health Maintenance  Health Maintenance Due  Topic Date Due   Zoster Vaccines- Shingrix (1 of 2) Never done   COVID-19 Vaccine (4 - Booster for Pfizer series) 07/26/2020    Colorectal cancer screening: No longer required.   Mammogram status: No longer required due to age.   Lung Cancer Screening: (Low Dose CT Chest recommended if Age 13-80 years, 30 pack-year currently smoking OR have quit w/in 15years.) does not qualify.   Lung Cancer Screening Referral: N/A  Additional Screening:  Hepatitis C Screening: does not qualify Vision Screening: Recommended annual ophthalmology exams for early detection of glaucoma and other disorders of the eye. Is the patient up to date with their annual eye exam?  No  Who is the provider or what is the name of the office in which the patient attends annual eye exams? N/A If pt is not established with a provider, would they like to be referred to a provider to establish care? No .   Dental Screening: Recommended annual  dental exams for proper oral hygiene  Community Resource Referral / Chronic Care Management: CRR required this visit?  No   CCM required this visit?  No      Plan:     I have personally reviewed and noted the following in the patients chart:   Medical and social history Use of alcohol, tobacco or illicit drugs  Current medications and supplements including opioid prescriptions. Patient is not currently taking opioid prescriptions. Functional ability and status Nutritional status Physical activity Advanced directives List of other physicians Hospitalizations, surgeries, and ER visits in previous 12 months Vitals Screenings to include cognitive, depression, and falls Referrals and appointments  In addition, I have reviewed and discussed with patient certain preventive protocols, quality metrics, and best practice recommendations. A written personalized care plan for preventive services as well as general preventive health recommendations were provided to patient.  Ms. Tomassetti , Thank you for taking time to come for your Medicare Wellness Visit. I appreciate your ongoing commitment to your health goals. Please review the following plan we discussed and let me know if I can assist you in the future.   These are the goals we discussed:  Goals   None     This is a list of the screening recommended for you and due dates:  Health Maintenance  Topic Date Due   Zoster (Shingles) Vaccine (1 of 2) Never done   COVID-19 Vaccine (4 - Booster for Pfizer series) 07/26/2020   Tetanus Vaccine  07/03/2031   Pneumonia Vaccine  Completed   Flu Shot  Completed   DEXA scan (bone density measurement)  Completed   HPV Vaccine  [address]       Gay Moncivais CauseyEllison, [place]MA   10/26/2021   Nurse Notes: None face to face 30 minute visit

## 2021-10-26 NOTE — Patient Instructions (Signed)

## 2021-10-26 NOTE — Progress Notes (Signed)
I discussed the AWV findings with the provider who conducted the visit. I was present in the office suite and immediately available to provide assistance and direction throughout the time the service was provided.  ?Fredonia Highland, MD ? ?

## 2021-10-29 ENCOUNTER — Other Ambulatory Visit: Payer: Self-pay

## 2021-10-29 ENCOUNTER — Encounter: Payer: Self-pay | Admitting: Student in an Organized Health Care Education/Training Program

## 2021-10-29 ENCOUNTER — Ambulatory Visit (INDEPENDENT_AMBULATORY_CARE_PROVIDER_SITE_OTHER): Payer: Medicare Other | Admitting: Student in an Organized Health Care Education/Training Program

## 2021-10-29 DIAGNOSIS — I700 Atherosclerosis of aorta: Secondary | ICD-10-CM

## 2021-10-29 DIAGNOSIS — Z9181 History of falling: Secondary | ICD-10-CM

## 2021-10-29 DIAGNOSIS — I10 Essential (primary) hypertension: Secondary | ICD-10-CM

## 2021-10-29 NOTE — Assessment & Plan Note (Signed)
BP elevated today with readings 190/73 and 181/84. Patient reports she did not take medications this morning. Because she endorses challenges remembering to take her medications, suggested consistent pill box refilling schedule and reminded patient to take medications at night too.  ?

## 2021-10-29 NOTE — Assessment & Plan Note (Signed)
Stable.  Continue treatment with pravastatin 20 mg daily. ?

## 2021-10-29 NOTE — Progress Notes (Signed)
? ?  This is a Careers information officer Note.  The care of the patient was discussed with Dr. Lalla Brothers, MD and the assessment and plan was formulated with their assistance.  Please see their note for official documentation of the patient encounter.  ? ?Subjective:  ? ?Patient ID: [name] female   DOB: 1935/11/22 86 y.o.   MRN: [id number] ? ?HPI: ?[name] is a 86 y.o. person with a PMHx of HTN, osteoarthritis, and falls who presents with her daughter for a routine appointment. She says she is doing well. She remains independent at home but receives help organizing her medications into pill boxes. She reportedly takes her daytime medications under direct supervision but sometimes forgets them at night and during weekends.  ? ?She describes balance challenges that occur randomly when walking around the house and has difficulty stepping out of the tub after showers. She has handlebars for the shower and bathroom wall but has fallen against the wall several times, which has resulted in significant R arm bruising. She does not have bruising currently because last fall was a few months ago.  ? ?Otherwise, she experiences occasional numbness and tingling in her fingertips and occasional memory challenges, but she has no other specific concerns. She brought her medication bottles to the appointment, and they were reviewed. ? ?Review of Systems: ?Pertinent items are noted in HPI. ?Objective:  ?Physical Exam: ?There were no vitals filed for this visit. ?There were no vitals taken for this visit. ? ?General Appearance:    Alert, cooperative, no distress, utilizing a walking stick to assist with ambulation  ?Eyes:    Wearing sunglasses for eye sensitivity  ?Lungs:     Clear to auscultation bilaterally, respirations unlabored  ? Heart:    Regular rate and rhythm. Capillary refill < 2 seconds.  ?Skin:   Visible skin normal, no RUE bruising or discoloration  ? ?Assessment & Plan:  ?[name] is a 86 y.o. person with  a PMHx of HTN, osteoarthritis, and falls who presents for a routine appointment and is doing well with need for increased medication adherence and fall precautions. ? ?Plan to have patient follow-up in 3 months. ? ?[name] ?Medical Student ?10/29/21 at 10:45am ?

## 2021-10-29 NOTE — Progress Notes (Signed)
Attestation for Student Documentation: ? ?I personally was present and performed or re-performed the history, physical exam and medical decision-making activities of this service and have verified that the service and findings are accurately documented in the student?s note. ? ?86 year old person here for follow-up of hypertension, which is uncontrolled today.  This is chronic, asymptomatic, severe hypertension.  I highly suspect based on her refill history that she is having trouble adhering to the medication regimen.  We discussed different strategies to improve, I encouraged her to use mail order pharmacy to help deliver the medications more consistently.  Also recommended filling her pillboxes on the same day each week.  Explained this to the patient and her daughter, they are going to make some changes and try to improve adherence.  I am not going to change the medication regimen as we have had episodes in the past where she has become hypotensive when she starts using all of the medicines at once.  For now we will continue with lisinopril 40 mg, amlodipine 5 mg, and clonidine 0.1 mg twice daily.  She continues to be at high risk for falls, having several falls at home including in the bathroom.  No centrally acting medications.  She is visually impaired.  We talked about the importance of using the assist devices which we have already ordered, she has a shower chair, handlebars installed, and a rolling walker but does not use them consistently.  She voices understanding of the need of using these in the future to lower her risk of falls. ? ?Tyson Alias, MD ?10/29/2021, 1:10 PM ? ?

## 2021-10-29 NOTE — Patient Instructions (Signed)
Very nice to see you today! We enjoyed the conversation. Here are a few reminders: ? ?- Continue filling your pill box on a specific day of the week so that we can make sure you are taking your medications each day. Do not forget your nighttime doses! ? ?- Use your shower chair in the shower to reduce the risk of falling ? ?- Use your walker and handlebars around the house to reduce the risk of falling ? ?We will see you again for an appointment in 3 months, but we will always be here if you need anything before then. Do not hesitate to call us if you need to! ?

## 2021-10-29 NOTE — Assessment & Plan Note (Addendum)
Patient reports frequent falls in the shower a few months ago and frightening episodes of balance loss walking in the house once every few weeks. Discussed strategies to reduce falls risk. Suggested she use the shower chair she owns in the shower instead of using it only when she gets out. Reminded her to use handle bars available to her on the walls in her house and also reminded the patient to use her walker for ambulation. Discussed risks of failing to modify behaviors, including hip fracture, and patient formerly owned an adult daycare facility, so she expressed good understanding. ?

## 2021-12-14 ENCOUNTER — Observation Stay (HOSPITAL_COMMUNITY)
Admission: EM | Admit: 2021-12-14 | Discharge: 2021-12-15 | Disposition: A | Discharge status: Home / Self Care | Payer: Medicare Other | Attending: Infectious Diseases | Admitting: Infectious Diseases

## 2021-12-14 ENCOUNTER — Encounter (HOSPITAL_COMMUNITY): Payer: Self-pay

## 2021-12-14 ENCOUNTER — Other Ambulatory Visit: Payer: Self-pay

## 2021-12-14 ENCOUNTER — Emergency Department (HOSPITAL_COMMUNITY): Payer: Medicare Other

## 2021-12-14 ENCOUNTER — Observation Stay (HOSPITAL_COMMUNITY): Payer: Medicare Other

## 2021-12-14 DIAGNOSIS — I10 Essential (primary) hypertension: Secondary | ICD-10-CM | POA: Insufficient documentation

## 2021-12-14 DIAGNOSIS — N179 Acute kidney failure, unspecified: Secondary | ICD-10-CM | POA: Insufficient documentation

## 2021-12-14 DIAGNOSIS — R4701 Aphasia: Secondary | ICD-10-CM

## 2021-12-14 DIAGNOSIS — Z79899 Other long term (current) drug therapy: Secondary | ICD-10-CM | POA: Insufficient documentation

## 2021-12-14 DIAGNOSIS — G9340 Encephalopathy, unspecified: Secondary | ICD-10-CM | POA: Diagnosis not present

## 2021-12-14 DIAGNOSIS — Z20822 Contact with and (suspected) exposure to covid-19: Secondary | ICD-10-CM | POA: Diagnosis not present

## 2021-12-14 DIAGNOSIS — N3000 Acute cystitis without hematuria: Secondary | ICD-10-CM | POA: Diagnosis not present

## 2021-12-14 DIAGNOSIS — R4781 Slurred speech: Secondary | ICD-10-CM

## 2021-12-14 DIAGNOSIS — F1290 Cannabis use, unspecified, uncomplicated: Secondary | ICD-10-CM | POA: Diagnosis not present

## 2021-12-14 DIAGNOSIS — B961 Klebsiella pneumoniae [K. pneumoniae] as the cause of diseases classified elsewhere: Secondary | ICD-10-CM | POA: Insufficient documentation

## 2021-12-14 DIAGNOSIS — R519 Headache, unspecified: Secondary | ICD-10-CM | POA: Diagnosis not present

## 2021-12-14 DIAGNOSIS — I499 Cardiac arrhythmia, unspecified: Secondary | ICD-10-CM | POA: Diagnosis not present

## 2021-12-14 DIAGNOSIS — R404 Transient alteration of awareness: Secondary | ICD-10-CM

## 2021-12-14 DIAGNOSIS — I6503 Occlusion and stenosis of bilateral vertebral arteries: Secondary | ICD-10-CM | POA: Diagnosis not present

## 2021-12-14 DIAGNOSIS — R531 Weakness: Secondary | ICD-10-CM | POA: Diagnosis not present

## 2021-12-14 DIAGNOSIS — F0390 Unspecified dementia without behavioral disturbance: Secondary | ICD-10-CM | POA: Diagnosis not present

## 2021-12-14 DIAGNOSIS — G9341 Metabolic encephalopathy: Secondary | ICD-10-CM | POA: Diagnosis present

## 2021-12-14 DIAGNOSIS — Z743 Need for continuous supervision: Secondary | ICD-10-CM | POA: Diagnosis not present

## 2021-12-14 DIAGNOSIS — R6889 Other general symptoms and signs: Secondary | ICD-10-CM | POA: Diagnosis not present

## 2021-12-14 DIAGNOSIS — I2510 Atherosclerotic heart disease of native coronary artery without angina pectoris: Secondary | ICD-10-CM | POA: Diagnosis not present

## 2021-12-14 DIAGNOSIS — G4489 Other headache syndrome: Secondary | ICD-10-CM | POA: Diagnosis not present

## 2021-12-14 DIAGNOSIS — I6523 Occlusion and stenosis of bilateral carotid arteries: Secondary | ICD-10-CM | POA: Diagnosis not present

## 2021-12-14 DIAGNOSIS — R29818 Other symptoms and signs involving the nervous system: Secondary | ICD-10-CM | POA: Diagnosis not present

## 2021-12-14 DIAGNOSIS — R300 Dysuria: Secondary | ICD-10-CM

## 2021-12-14 DIAGNOSIS — I6622 Occlusion and stenosis of left posterior cerebral artery: Secondary | ICD-10-CM | POA: Diagnosis not present

## 2021-12-14 LAB — DIFFERENTIAL
Abs Immature Granulocytes: 0.02 10*3/uL (ref 0.00–0.07)
Basophils Absolute: 0.1 10*3/uL (ref 0.0–0.1)
Basophils Relative: 1 %
Eosinophils Absolute: 0.4 10*3/uL (ref 0.0–0.5)
Eosinophils Relative: 6 %
Immature Granulocytes: 0 %
Lymphocytes Relative: 36 %
Lymphs Abs: 2.5 10*3/uL (ref 0.7–4.0)
Monocytes Absolute: 0.7 10*3/uL (ref 0.1–1.0)
Monocytes Relative: 11 %
Neutro Abs: 3.2 10*3/uL (ref 1.7–7.7)
Neutrophils Relative %: 46 %

## 2021-12-14 LAB — URINALYSIS, ROUTINE W REFLEX MICROSCOPIC
Bilirubin Urine: NEGATIVE
Glucose, UA: NEGATIVE mg/dL
Ketones, ur: NEGATIVE mg/dL
Nitrite: NEGATIVE
Protein, ur: NEGATIVE mg/dL
Specific Gravity, Urine: 1.031 — ABNORMAL HIGH (ref 1.005–1.030)
WBC, UA: >50 WBC/hpf — ABNORMAL HIGH (ref 0–5)
pH: 6 (ref 5.0–8.0)

## 2021-12-14 LAB — I-STAT CHEM 8, ED
BUN: 15 mg/dL (ref 8–23)
Calcium, Ion: 1.03 mmol/L — ABNORMAL LOW (ref 1.15–1.40)
Chloride: 110 mmol/L (ref 98–111)
Creatinine, Ser: 1.4 mg/dL — ABNORMAL HIGH (ref 0.44–1.00)
Glucose, Bld: 107 mg/dL — ABNORMAL HIGH (ref 70–99)
HCT: 41 % (ref 36.0–46.0)
Hemoglobin: 13.9 g/dL (ref 12.0–15.0)
Potassium: 4 mmol/L (ref 3.5–5.1)
Sodium: 141 mmol/L (ref 135–145)
TCO2: 23 mmol/L (ref 22–32)

## 2021-12-14 LAB — ETHANOL: Alcohol, Ethyl (B): <10 mg/dL (ref <10)

## 2021-12-14 LAB — RESP PANEL BY RT-PCR (FLU A&B, COVID) ARPGX2
Influenza A by PCR: NEGATIVE
Influenza B by PCR: NEGATIVE
SARS Coronavirus 2 by RT PCR: NEGATIVE

## 2021-12-14 LAB — RAPID URINE DRUG SCREEN, HOSP PERFORMED
Amphetamines: NOT DETECTED
Barbiturates: NOT DETECTED
Benzodiazepines: NOT DETECTED
Cocaine: NOT DETECTED
Opiates: NOT DETECTED
Tetrahydrocannabinol: POSITIVE — AB

## 2021-12-14 LAB — CBG MONITORING, ED: Glucose-Capillary: 124 mg/dL — ABNORMAL HIGH (ref 70–99)

## 2021-12-14 LAB — BASIC METABOLIC PANEL
Anion gap: 8 (ref 5–15)
BUN: 13 mg/dL (ref 8–23)
CO2: 22 mmol/L (ref 22–32)
Calcium: 8.6 mg/dL — ABNORMAL LOW (ref 8.9–10.3)
Chloride: 112 mmol/L — ABNORMAL HIGH (ref 98–111)
Creatinine, Ser: 1.16 mg/dL — ABNORMAL HIGH (ref 0.44–1.00)
GFR, Estimated: 46 mL/min — ABNORMAL LOW (ref >60)
Glucose, Bld: 113 mg/dL — ABNORMAL HIGH (ref 70–99)
Potassium: 4.1 mmol/L (ref 3.5–5.1)
Sodium: 142 mmol/L (ref 135–145)

## 2021-12-14 LAB — CBC
HCT: 43.5 % (ref 36.0–46.0)
Hemoglobin: 13.7 g/dL (ref 12.0–15.0)
MCH: 29.7 pg (ref 26.0–34.0)
MCHC: 31.5 g/dL (ref 30.0–36.0)
MCV: 94.4 fL (ref 80.0–100.0)
Platelets: 263 10*3/uL (ref 150–400)
RBC: 4.61 MIL/uL (ref 3.87–5.11)
RDW: 13.1 % (ref 11.5–15.5)
WBC: 6.9 10*3/uL (ref 4.0–10.5)
nRBC: 0 % (ref 0.0–0.2)

## 2021-12-14 LAB — COMPREHENSIVE METABOLIC PANEL
ALT: 11 U/L (ref 0–44)
AST: 17 U/L (ref 15–41)
Albumin: 3.9 g/dL (ref 3.5–5.0)
Alkaline Phosphatase: 117 U/L (ref 38–126)
Anion gap: 7 (ref 5–15)
BUN: 13 mg/dL (ref 8–23)
CO2: 23 mmol/L (ref 22–32)
Calcium: 9.2 mg/dL (ref 8.9–10.3)
Chloride: 111 mmol/L (ref 98–111)
Creatinine, Ser: 1.38 mg/dL — ABNORMAL HIGH (ref 0.44–1.00)
GFR, Estimated: 38 mL/min — ABNORMAL LOW (ref >60)
Glucose, Bld: 111 mg/dL — ABNORMAL HIGH (ref 70–99)
Potassium: 4.1 mmol/L (ref 3.5–5.1)
Sodium: 141 mmol/L (ref 135–145)
Total Bilirubin: 0.5 mg/dL (ref 0.3–1.2)
Total Protein: 6.9 g/dL (ref 6.5–8.1)

## 2021-12-14 LAB — PROTIME-INR
INR: 1 (ref 0.8–1.2)
Prothrombin Time: 12.9 seconds (ref 11.4–15.2)

## 2021-12-14 LAB — APTT: aPTT: 25 seconds (ref 24–36)

## 2021-12-14 MED ORDER — LACTATED RINGERS IV SOLN: INTRAVENOUS | Status: AC

## 2021-12-14 MED ORDER — CEFDINIR 300 MG PO CAPS
300.0000 mg | ORAL_CAPSULE | Freq: Two times a day (BID) | ORAL | Status: DC
  Administered 2021-12-15: 300 mg via ORAL
  Filled 2021-12-14 (×2): qty 1

## 2021-12-14 MED ORDER — ALBUTEROL SULFATE (2.5 MG/3ML) 0.083% IN NEBU: 3.0000 mL | INHALATION_SOLUTION | Freq: Four times a day (QID) | RESPIRATORY_TRACT | Status: DC | PRN

## 2021-12-14 MED ORDER — DONEPEZIL HCL 10 MG PO TABS
5.0000 mg | ORAL_TABLET | Freq: Every day | ORAL | Status: DC
  Administered 2021-12-14: 5 mg via ORAL
  Filled 2021-12-14 (×2): qty 1

## 2021-12-14 MED ORDER — IOHEXOL 350 MG/ML SOLN
75.0000 mL | Freq: Once | INTRAVENOUS | Status: AC | PRN
  Administered 2021-12-14: 75 mL via INTRAVENOUS

## 2021-12-14 MED ORDER — SODIUM CHLORIDE 0.9 % IV SOLN
1.0000 g | Freq: Once | INTRAVENOUS | Status: AC
  Administered 2021-12-14: 1 g via INTRAVENOUS
  Filled 2021-12-14: qty 10

## 2021-12-14 MED ORDER — PRAVASTATIN SODIUM 40 MG PO TABS
20.0000 mg | ORAL_TABLET | Freq: Every day | ORAL | Status: DC
  Administered 2021-12-14 – 2021-12-15 (×2): 20 mg via ORAL
  Filled 2021-12-14 (×2): qty 1

## 2021-12-14 MED ORDER — SODIUM CHLORIDE 0.9 % IV BOLUS
500.0000 mL | Freq: Once | INTRAVENOUS | Status: AC
  Administered 2021-12-14: 500 mL via INTRAVENOUS

## 2021-12-14 MED ORDER — HEPARIN SODIUM (PORCINE) 5000 UNIT/ML IJ SOLN
5000.0000 [IU] | Freq: Three times a day (TID) | INTRAMUSCULAR | Status: DC
  Administered 2021-12-14 – 2021-12-15 (×2): 5000 [IU] via SUBCUTANEOUS
  Filled 2021-12-14 (×2): qty 1

## 2021-12-14 MED ORDER — LORAZEPAM 2 MG/ML IJ SOLN
0.5000 mg | Freq: Once | INTRAMUSCULAR | Status: AC
  Administered 2021-12-14: 0.5 mg via INTRAVENOUS
  Filled 2021-12-14: qty 1

## 2021-12-14 NOTE — ED Notes (Signed)
Tech tried to bladder scan, bladder scan has low battery, will not scan until half way charged. Will attempt later. ?

## 2021-12-14 NOTE — ED Notes (Signed)
Called MRI and notified them that report had been given to 3W and that pt was ok to be transported to the floor after the MRI. ?

## 2021-12-14 NOTE — H&P (Addendum)
? ? ? ?Date: 12/14/2021     ?     ?     ?Patient Name:  [name] MRN: [id number]  ?DOB: 03-Mar-1936 Age / Sex: 86 y.o., female   ?PCP: Axel Filler, MD    ?     ?Medical Service: Internal Medicine Teaching Service    ?     ?Attending Physician: Dr. Campbell Riches, MD    ?First Contact: Dr. Wayland Denis Pager: [phone]  ?Second Contact: Dr. Iona Beard Pager: [phone]  ?     ?After Hours (After 5p/  First Contact Pager: [phone]  ?weekends / holidays): Second Contact Pager: [phone]  ? ?Chief Complaint: "stroke-like symptoms" ? ?History of Present Illness:  ?Karim Haufler is an 86 yo female with PMHx of dementia, glaucoma with prosthetic right eye, HTN, HLD, CAD, who presents to [facility] ED for intermittent "strokelike symptoms" witnessed by family earlier today. ? ?LKW 2 PM today when family noted acute change in speech and abnormality of gait.  At that time patient also reporting headache. Patient reports she did not know she was acting differently.  Denied focal weakness, new vision changes, confusion.  Family reports she had to walk with a cane today whereas previously not using cane.  Family thought she was having difficulty with balance and might have been leaning to one side though family cannot recall which direction.  Family also felt patient was more confused today which patient denies.  At baseline patient oriented to self, location, family members, though has difficulties with dates, forgetting where she put things, forgetting why she walked into her room. ? ?Patient also endorsing dysuria, urinary frequency, urinary urge without ability to void for the past few days.  Patient reports she feels drinking cranberry juice will clear this and that she does not feel she needs to be hospitalized for this.  She denies fevers, chills, sweats, chest pain, shortness of breath, abdominal pain, nausea, vomiting. ? ? ?Meds:  ?Current Outpatient Medications  ?Medication Instructions  ? albuterol  (PROVENTIL HFA) 108 (90 Base) MCG/ACT inhaler 1 puff, Inhalation, Every 6 hours PRN  ? ALPHAGAN P 0.1 % SOLN NEW PRESCRIPTION REQUEST: INSTILL ONE DROP IN THE LEFT EYE TWICE DAILY  ? amLODipine (NORVASC) 5 mg, Oral, Daily  ? bimatoprost (LUMIGAN) 0.03 % ophthalmic solution 1 drop, Daily at bedtime  ? cloNIDine (CATAPRES) 0.1 mg, Oral, 2 times daily  ? donepezil (ARICEPT) 5 mg, Oral, Daily at bedtime  ? gabapentin (NEURONTIN) 100 MG capsule TAKE 2 CAPSULES BY MOUTH IN THE MORNING, AND 4 CAPSULES AT BEDTIME  ? lisinopril (ZESTRIL) 40 mg, Oral, Daily  ? nitroGLYCERIN (NITROSTAT) 0.4 MG SL tablet NEW PRESCRIPTION REQUEST: DISSOLVE 1 TABLET UNDER THE TONGUE EVERY 5 MINUTES AS NEEDED FOR CHEST PAIN. DO NOT EXCEED A TOTAL OF 3 DOSES IN 15 MINUTES.  ? potassium chloride SA (KLOR-CON) 20 MEQ tablet 20 mEq, Oral, Daily  ? pravastatin (PRAVACHOL) 20 mg, Oral, Daily  ?Patient currently not taking albuterol, eyedrops.  Daughter who manages medications reports patient adherent to the above medications aside from occasional missed nighttime doses due to patient refusing. ? ?Allergies: ?Allergies as of 12/14/2021 - Review Complete 12/14/2021  ?Allergen Reaction Noted  ? Aspirin  04/22/2007  ? Codeine Nausea Only   ? ?Past Medical History:  ?Diagnosis Date  ? Arthritis   ? 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 Patient right eye is enucleated due to end stage glaucoma and she  has a prosthesis in place. Left eye sees shadows and is bothered by bright lights. She wears specialty sunglasses which help her with sensitivity.   ? CAD (coronary artery disease)   ? nonobstructive, myoview normal 03/2007, done by Dr. Verl Blalock - EF = 75%  ? Dementia ([place])   ? Glaucoma   ? HLD (hyperlipidemia)   ? Hypertension   ? ? ?Family History:  ?Family History  ?Problem Relation Age of Onset  ? Stroke Neg Hx   ? Cancer Neg Hx   ? ?Social History: Patient lives in [place] with her granddaughter.  Patient's daughter also  stops by daily to provide support and manage medications.  Patient has a cane at home though refuses to use this.  She manages her own ADLs and has assistance with IADLs.  She denies alcohol use, tobacco use, illicit drug use. ? ?Review of Systems: ?A complete ROS was negative except as per HPI.  ? ?Physical Exam: ?Blood pressure (!) 144/74, pulse 78, temperature (!) 97.5 ?F (36.4 ?C), temperature source Oral, resp. rate 17, weight 68.8 kg, SpO2 99 %. ?Physical Exam: ?General: Elderly African-American female, overweight, NAD ?HENT: External ears and nares appear normal ?EYES: conjunctiva non-erythematous, no scleral icterus, prosthetic right eye with lid droop ?CV: regular rate, normal rhythm, no murmurs, rubs, gallops.  Trace lower extremity edema bilaterally,feet cool with 2+ pedal pulses ?Pulmonary: normal work of breathing on RA, lungs clear to auscultation, no rales, wheezes, rhonchi ?Abdominal: non-distended, soft, non-tender to palpation, normal BS ?Skin: Warm and dry, no rashes or lesions, very overgrown toenails ?Neurological: ?MS: awake, alert and oriented to self and location, did not know her birthday or month but knew the year was 2023. Normal speech.  Face appears symmetric, tongue midline. ?Motor: moves all extremities antigravity without drift ?Psych: normal affect ? ?CBC ?   ?Component Value Date/Time  ? WBC 6.9 12/14/2021 1520  ? RBC 4.61 12/14/2021 1520  ? HGB 13.9 12/14/2021 1527  ? HGB 12.6 06/21/2021 1131  ? HCT 41.0 12/14/2021 1527  ? HCT 36.5 06/21/2021 1131  ? PLT 263 12/14/2021 1520  ? PLT 271 06/21/2021 1131  ? MCV 94.4 12/14/2021 1520  ? MCV 86 06/21/2021 1131  ? MCH 29.7 12/14/2021 1520  ? MCHC 31.5 12/14/2021 1520  ? RDW 13.1 12/14/2021 1520  ? RDW 13.2 06/21/2021 1131  ? LYMPHSABS 2.5 12/14/2021 1520  ? LYMPHSABS 1.2 06/21/2021 1131  ? MONOABS 0.7 12/14/2021 1520  ? EOSABS 0.4 12/14/2021 1520  ? EOSABS 0.0 06/21/2021 1131  ? BASOSABS 0.1 12/14/2021 1520  ? BASOSABS 0.1 06/21/2021 1131   ? ?CMP  ?   ?Component Value Date/Time  ? NA 141 12/14/2021 1527  ? NA 143 06/21/2021 1131  ? K 4.0 12/14/2021 1527  ? CL 110 12/14/2021 1527  ? CO2 23 12/14/2021 1520  ? GLUCOSE 107 (H) 12/14/2021 1527  ? BUN 15 12/14/2021 1527  ? BUN 8 06/21/2021 1131  ? CREATININE 1.40 (H) 12/14/2021 1527  ? CREATININE 0.78 09/20/2014 1024  ? CALCIUM 9.2 12/14/2021 1520  ? PROT 6.9 12/14/2021 1520  ? PROT 6.5 06/21/2021 1131  ? ALBUMIN 3.9 12/14/2021 1520  ? ALBUMIN 4.1 06/21/2021 1131  ? AST 17 12/14/2021 1520  ? ALT 11 12/14/2021 1520  ? ALKPHOS 117 12/14/2021 1520  ? BILITOT 0.5 12/14/2021 1520  ? BILITOT 0.2 06/21/2021 1131  ? GFRNONAA 38 (L) 12/14/2021 1520  ? GFRNONAA 74 09/20/2014 1024  ? GFRAA 58 (L) 04/26/2020 1055  ?  GFRAA 85 09/20/2014 1024  ? ?CT HEAD CODE STROKE WO CONTRAST ? ?Result Date: 12/14/2021 ?CLINICAL DATA:  Provided history: Neuro deficit, acute, stroke suspected. EXAM: CT ANGIOGRAPHY HEAD AND NECK TECHNIQUE: Multidetector CT imaging of the head and neck was performed using the standard protocol during bolus administration of intravenous contrast. Multiplanar CT image reconstructions and MIPs were obtained to evaluate the vascular anatomy. Carotid stenosis measurements (when applicable) are obtained utilizing NASCET criteria, using the distal internal carotid diameter as the denominator. RADIATION DOSE REDUCTION: This exam was performed according to the departmental dose-optimization program which includes automated exposure control, adjustment of the mA and/or kV according to patient size and/or use of iterative reconstruction technique. CONTRAST:  Administered contrast not known at this time COMPARISON:  Noncontrast head CT 06/26/2021. Brain MRI 08/22/2017. CT angiogram head/neck 02/14/2016. FINDINGS: CT HEAD FINDINGS Brain: Mild generalized cerebral atrophy. There is no acute intracranial hemorrhage. No demarcated cortical infarct. No extra-axial fluid collection. No evidence of an intracranial mass. No  midline shift. Vascular: No hyperdense vessel.  Atherosclerotic calcifications Skull: Normal. Negative for fracture or focal lesion. Sinuses: Extensive partial opacification of the right sphenoid sinus with asso

## 2021-12-14 NOTE — Consult Note (Addendum)
Neurology Consultation ? ?Reason for Consult: Code stroke  ?Referring Physician: Dr. Criss AlvineGoldston ? ?CC: headache, right side weakness and word salad ? ?History is obtained from:Family and medical record  ? ?HPI: [name] is a 86 y.o. female with past medical history of CAD, Dementia, HTN, HLD, glaucoma with right eye prosthesis who presents [facility] ED via EMS for on and off symptoms of right side weakness, headache, aphasia and right arm shaking. Per Family symptoms have been occurring off and on since Sunday. For EMS BP 172/93, CBG 109. Per POA, she has been complaining of dysuria and frequency. Code stroke called by EMS.  ? ? ?LKW: 1330 ?tpa given?: no, no acute stroke, low suspicion  ?Premorbid modified Rankin scale (mRS):  ?2-Slight disability-UNABLE to perform all activities but does not need assistance  ? ?ROS: Full ROS was performed and is negative except as noted in the HPI. ? ?Past Medical History:  ?Diagnosis Date  ? Arthritis   ? 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 Patient right eye is enucleated due to end stage glaucoma and she has a prosthesis in place. Left eye sees shadows and is bothered by bright lights. She wears specialty sunglasses which help her with sensitivity.   ? CAD (coronary artery disease)   ? nonobstructive, myoview normal 03/2007, done by Dr. Daleen SquibbWall - EF = 75%  ? Dementia ([facility])   ? Glaucoma   ? HLD (hyperlipidemia)   ? Hypertension   ? ? ? ?Essential (primary) hypertension  ? ?Family History  ?Problem Relation Age of Onset  ? Stroke Neg Hx   ? Cancer Neg Hx   ? ? ? ?Social History:  ? reports that she has quit smoking. Her smoking use included cigarettes. She has never used smokeless tobacco. She reports that she does not drink alcohol and does not use drugs. ? ?Medications ?No current facility-administered medications for this encounter. ? ?Current Outpatient Medications:  ?  albuterol (PROVENTIL HFA) 108 (90 Base) MCG/ACT inhaler, Inhale 1 puff  into the lungs every 6 (six) hours as needed for wheezing or shortness of breath. (Patient not taking: Reported on 07/03/2020), Disp: 18 g, Rfl: 1 ?  ALPHAGAN P 0.1 % SOLN, NEW PRESCRIPTION REQUEST: INSTILL ONE DROP IN THE LEFT EYE TWICE DAILY (Patient not taking: Reported on 10/26/2021), Disp: 15 mL, Rfl: 3 ?  amLODipine (NORVASC) 5 MG tablet, Take 1 tablet (5 mg total) by mouth daily., Disp: 90 tablet, Rfl: 3 ?  bimatoprost (LUMIGAN) 0.03 % ophthalmic solution, Place 1 drop into the left eye at bedtime.   (Patient not taking: Reported on 10/26/2021), Disp: , Rfl:  ?  cloNIDine (CATAPRES) 0.1 MG tablet, Take 1 tablet (0.1 mg total) by mouth 2 (two) times daily., Disp: 180 tablet, Rfl: 3 ?  donepezil (ARICEPT) 5 MG tablet, Take 1 tablet (5 mg total) by mouth at bedtime., Disp: 90 tablet, Rfl: 3 ?  gabapentin (NEURONTIN) 100 MG capsule, TAKE 2 CAPSULES BY MOUTH IN THE MORNING, AND 4 CAPSULES AT BEDTIME, Disp: 180 capsule, Rfl: 3 ?  lisinopril (ZESTRIL) 40 MG tablet, Take 1 tablet (40 mg total) by mouth daily., Disp: 90 tablet, Rfl: 3 ?  nitroGLYCERIN (NITROSTAT) 0.4 MG SL tablet, NEW PRESCRIPTION REQUEST: DISSOLVE 1 TABLET UNDER THE TONGUE EVERY 5 MINUTES AS NEEDED FOR CHEST PAIN. DO NOT EXCEED A TOTAL OF 3 DOSES IN 15 MINUTES. (Patient not taking: Reported on 10/26/2021), Disp: 75 tablet, Rfl: 0 ?  potassium  chloride SA (KLOR-CON) 20 MEQ tablet, Take 1 tablet (20 mEq total) by mouth daily., Disp: 90 tablet, Rfl: 1 ?  pravastatin (PRAVACHOL) 20 MG tablet, Take 1 tablet (20 mg total) by mouth daily. (Patient not taking: Reported on 07/03/2020), Disp: 90 tablet, Rfl: 1 ? ? ?Exam: ?Current vital signs: ?BP (!) 185/73   Pulse 76   Resp 16   Wt 68.8 kg   SpO2 97%   BMI 25.24 kg/m?  ?Vital signs in last 24 hours: ?Pulse Rate:  [76-90] 76 (04/21 1553) ?Resp:  [16-23] 16 (04/21 1553) ?BP: (185)/(73) 185/73 (04/21 1545) ?SpO2:  [97 %-98 %] 97 % (04/21 1553) ?Weight:  [68.8 kg] 68.8 kg (04/21 1524) ? ?GENERAL: Awake, alert in  NAD ?HEENT: - Normocephalic and atraumatic, dry mm,Right eye prosthesis ?LUNGS - Clear to auscultation bilaterally with no wheezes ?CV - S1S2 RRR, no m/r/g, equal pulses bilaterally. ?ABDOMEN - Soft, nontender, nondistended with normoactive BS ?Ext: warm, well perfused, intact peripheral pulses, no edema ? ?NEURO:  ?Mental Status: AA&Ox2. Named wrong month. ?Language: speech is clear.  Naming, repetition, fluency, and comprehension intact. ?Cranial Nerves: PERRL 73mm/brisk. EOMI, visual fields full, no facial asymmetry, facial sensation intact, hearing intact, tongue/uvula/soft palate midline, normal sternocleidomastoid and trapezius muscle strength. No evidence of tongue atrophy or fibrillations ?Motor: Right arm and leg 4/5, Right arm and right leg 5/5 ?Tone: is normal and bulk is normal ?Sensation- Intact to light touch bilaterally ?Coordination: FTN intact bilaterally, no ataxia in BLE. ?Gait- deferred ? ?NIHSS ?1a Level of Conscious.: 0 ?1b LOC Questions: 1 ?1c LOC Commands: 0 ?2 Best Gaze: 0 ?3 Visual: 0 ?4 Facial Palsy: 0 ?5a Motor Arm - left: 0 ?5b Motor Arm - Right: 1 ?6a Motor Leg - Left: 0 ?6b Motor Leg - Right: 1 ?7 Limb Ataxia: 0 ?8 Sensory: 0 ?9 Best Language: 0 ?10 Dysarthria: 0 ?11 Extinct. and Inatten.: 0 ?TOTAL: 3  ? ? ?Imaging ?I have reviewed the images obtained: ? ?Code stroke CT-head 4/21: ?No evidence of acute intracranial abnormality. ? ?CTA H 4/21: ?1. No intracranial large vessel occlusion is identified. ?2. Intracranial atherosclerotic disease, progressed from the prior CTA head of 02/14/2016 and with findings most notably as follows. ?3. Up to moderate stenosis within the paraclinoid right ICA. ?4. Moderate stenosis within the supraclinoid left ICA. ?5. Moderate stenosis within an inferior division proximal M2 left MCA vessel. ?6. Foci of up to moderate stenosis within the left A1 segment. ?7. Severe focal stenosis within the A4 left anterior cerebral artery. ?8. Moderate stenosis within  the P2 segment of the left PCA. ?9. 1 mm inferiorly projecting vascular protrusion arising from the paraclinoid left ICA, which may reflect an infundibulum or small aneurysm. ?  ?CTA N 4/21: ?1. The common carotid and internal carotid arteries are patent within the neck without stenosis. Atherosclerotic plaque, bilaterally, as described. ?2. Vertebral arteries codominant and patent within the neck. Moderate/severe atherosclerotic stenosis at the origin of the right vertebral artery, progressed from the prior CTA neck of 02/14/2016. Calcified plaque at the origin of the left vertebral artery resulting in at least moderate stenosis, also progressed. ?3.  Aortic Atherosclerosis (ICD10-I70.0). ?4. Advanced cervical spondylosis. Most notably at C5-C6, a central disc protrusion contributes to severe spinal canal stenosis with spinal cord impingement. A cervical spine MRI may be obtained for ?further evaluation, as clinically warranted ? ?Assessment:  ?[name] is a 86 y.o. female with past medical history of CAD, Dementia, HTN, HLD, glaucoma with right  eye prosthesis who presents to [facility] ED via EMS for on and off symptoms of right side weakness, headache, aphasia and right arm shaking. Per Family symptoms have been occurring off and on since Sunday. For EMS BP 172/93, CBG 109. Per POA, she has been complaining of dysuria and frequency. ? ?IMPRESSION ?Question toxic metabolic encephalopathy due to UTI versus new stroke. ? ?Recommendations: ?- Check UA and UDS (already ordered) ?- Recs to follow ? ?Gevena Mart DNP, ACNPC-AG ? ?Attending addendum ?Patient seen and examined as an acute code stroke ?Few days worth of symptoms of frequent urination and off-and-on slurred speech and tremulousness. ?Examination performed independently and agree with above. ?Low NIH stroke scale-preclude stroke treatment. ?UA and UDS ordered ?UA consistent with UTI ?I would still recommend obtaining an MRI to make sure no stroke is  missed. ?If there is no stroke, no further stroke work-up needed ?If the MRI is positive for stroke, admit for full stroke work-up ?Plan was relayed to Dr. Criss Alvine ? ?-- ?Milon Dikes, MD ?Neurologist ?[facility]

## 2021-12-14 NOTE — ED Provider Notes (Signed)
Patient seen at the bridge as a code stroke.  She is answering questions appropriately.  Airway clear for remainder of stroke evaluation ?  ?Arthor Captain, PA-C ?12/14/21 1519 ? ?  ?Benjiman Core, MD ?12/15/21 1436 ? ?

## 2021-12-14 NOTE — ED Triage Notes (Signed)
Pt BIB [facility] from home as a code stroke.Per EMS, pt started having deficits including imbalance, expressive aphasia and R facial droop at 1410 today. Pt is bit hypertensive in the 170s. Per family, pt has dementia and is not compliant w her BP meds. A&O X4.  ?

## 2021-12-14 NOTE — ED Provider Notes (Signed)
?[facility] ?Provider Note ? ? ?CSN: [id number] ?Arrival date & time: 12/14/21  1516 ? ?An emergency department physician performed an initial assessment on this suspected stroke patient at [id number]. ? ?History ? ?Code Stroke ? ?[name] is a 86 y.o. female hx of hypertension, dementia, medication noncompliance here for evaluation of weakness under a code stroke.  Last known initially thought to be 1410 today.  EMS activated code stroke for intermittent gait imbalances, expressive aphasia and  facial droop.  Noted to be hypertensive.  On arrival patient had possible some very mild right-sided weakness.  She has right eye prosthesis. ? ?When family arrived it was found that patient symptoms have been waxing and waning since Sunday.  Patient also having urinary frequency, dysuria and urgency since Sunday.  Family states patient has been intermittently confused since.  No recent falls.  No fever, cough, emesis, chest pain or abdominal pain. ? ?Patient without any complaints on arrival. ? ?PCP- IM teaching, Oswaldo DoneVincent ? ? ? ? ?HPI ? ?  ? ?Home Medications ?Prior to Admission medications   ?Medication Sig Start Date End Date Taking? Authorizing Provider  ?albuterol (PROVENTIL HFA) 108 (90 Base) MCG/ACT inhaler Inhale 1 puff into the lungs every 6 (six) hours as needed for wheezing or shortness of breath. ?Patient not taking: Reported on 07/03/2020 07/20/19   Tyson AliasVincent, Duncan Thomas, MD  ?[facility] SOLN NEW PRESCRIPTION REQUEST: INSTILL ONE DROP IN THE LEFT EYE TWICE DAILY ?Patient not taking: Reported on 10/26/2021 08/17/19   Tyson AliasVincent, Duncan Thomas, MD  ?amLODipine (NORVASC) 5 MG tablet Take 1 tablet (5 mg total) by mouth daily. 07/02/21 07/02/22  Tyson AliasVincent, Duncan Thomas, MD  ?bimatoprost (LUMIGAN) 0.03 % ophthalmic solution Place 1 drop into the left eye at bedtime.   ?Patient not taking: Reported on 10/26/2021    [provider]  ?cloNIDine (CATAPRES) 0.1 MG tablet Take 1  tablet (0.1 mg total) by mouth 2 (two) times daily. 06/26/21   Tyson AliasVincent, Duncan Thomas, MD  ?donepezil (ARICEPT) 5 MG tablet Take 1 tablet (5 mg total) by mouth at bedtime. 06/12/20   Tyson AliasVincent, Duncan Thomas, MD  ?gabapentin (NEURONTIN) 100 MG capsule TAKE 2 CAPSULES BY MOUTH IN THE MORNING, AND 4 CAPSULES AT BEDTIME 06/26/21   Tyson AliasVincent, Duncan Thomas, MD  ?lisinopril (ZESTRIL) 40 MG tablet Take 1 tablet (40 mg total) by mouth daily. 06/26/21   Tyson AliasVincent, Duncan Thomas, MD  ?nitroGLYCERIN (NITROSTAT) 0.4 MG SL tablet NEW PRESCRIPTION REQUEST: DISSOLVE 1 TABLET UNDER THE TONGUE EVERY 5 MINUTES AS NEEDED FOR CHEST PAIN. DO NOT EXCEED A TOTAL OF 3 DOSES IN 15 MINUTES. ?Patient not taking: Reported on 10/26/2021 08/17/19   Tyson AliasVincent, Duncan Thomas, MD  ?potassium chloride SA (KLOR-CON) 20 MEQ tablet Take 1 tablet (20 mEq total) by mouth daily. 07/02/21   Tyson AliasVincent, Duncan Thomas, MD  ?pravastatin (PRAVACHOL) 20 MG tablet Take 1 tablet (20 mg total) by mouth daily. ?Patient not taking: Reported on 07/03/2020 06/12/20   Tyson AliasVincent, Duncan Thomas, MD  ?   ? ?Allergies    ?Aspirin and Codeine   ? ?Review of Systems   ?Review of Systems  ?Constitutional: Negative.   ?HENT: Negative.    ?Respiratory: Negative.    ?Cardiovascular: Negative.   ?Gastrointestinal: Negative.   ?Genitourinary:  Positive for dysuria, frequency and urgency.  ?Musculoskeletal:  Positive for gait problem.  ?Skin: Negative.   ?Neurological:  Positive for facial asymmetry, speech difficulty, weakness and light-headedness. Negative for dizziness, tremors, seizures,  syncope, numbness and headaches.  ?All other systems reviewed and are negative. ? ?Physical Exam ?Updated Vital Signs ?BP (!) 147/65   Pulse 83   Temp (!) 97.5 ?F (36.4 ?C) (Oral)   Resp 14   Wt 68.8 kg   SpO2 97%   BMI 25.24 kg/m?  ?Physical Exam ?Vitals and nursing note reviewed.  ?Constitutional:   ?   General: She is not in acute distress. ?   Appearance: She is well-developed. She is not ill-appearing,  toxic-appearing or diaphoretic.  ?HENT:  ?   Head: Normocephalic and atraumatic.  ?   Nose: Nose normal.  ?   Mouth/Throat:  ?   Mouth: Mucous membranes are dry.  ?Eyes:  ?   Pupils: Pupils are equal, round, and reactive to light.  ?   Comments: Right eye prosthesis, right eyelid Ptosis, chronic per family. No facial lesions ?Left EOM intake, PERRLA  ?Neck:  ?   Trachea: Trachea and phonation normal.  ?   Comments: Full ROM. No midline tenderness ?Cardiovascular:  ?   Rate and Rhythm: Normal rate.  ?   Pulses: Normal pulses.     ?     Radial pulses are 2+ on the right side and 2+ on the left side.  ?     Dorsalis pedis pulses are 2+ on the right side and 2+ on the left side.  ?   Heart sounds: Normal heart sounds.  ?Pulmonary:  ?   Effort: Pulmonary effort is normal. No respiratory distress.  ?   Breath sounds: Normal breath sounds.  ?   Comments: Speaks in full sentences, clear speech ?Abdominal:  ?   General: Bowel sounds are normal. There is no distension.  ?   Palpations: Abdomen is soft.  ?   Tenderness: There is no abdominal tenderness. There is no guarding or rebound.  ?Musculoskeletal:     ?   General: No swelling, tenderness, deformity or signs of injury. Normal range of motion.  ?   Cervical back: Full passive range of motion without pain and normal range of motion.  ?Skin: ?   General: Skin is warm and dry.  ?   Capillary Refill: Capillary refill takes less than 2 seconds.  ?   Comments: No obvious rash or lesions  ?Neurological:  ?   Mental Status: Mental status is at baseline.  ?   Sensory: Sensation is intact.  ?   Motor: Motor function is intact.  ?   Comments: CN 2-12 grossly intact, right eye prosthesis ?Equal strength 4.5 BL upper and Lower extremities ?Finger to nose neg ?Intact sensation BL  ?Psychiatric:     ?   Mood and Affect: Mood normal.  ? ? ?ED Results / Procedures / Treatments   ?Labs ?(all labs ordered are listed, but only abnormal results are displayed) ?Labs Reviewed  ?COMPREHENSIVE  METABOLIC PANEL - Abnormal; Notable for the following components:  ?    Result Value  ? Glucose, Bld 111 (*)   ? Creatinine, Ser 1.38 (*)   ? GFR, Estimated 38 (*)   ? All other components within normal limits  ?RAPID URINE DRUG SCREEN, HOSP PERFORMED - Abnormal; Notable for the following components:  ? Tetrahydrocannabinol POSITIVE (*)   ? All other components within normal limits  ?URINALYSIS, ROUTINE W REFLEX MICROSCOPIC - Abnormal; Notable for the following components:  ? APPearance CLOUDY (*)   ? Specific Gravity, Urine 1.031 (*)   ? Hgb urine dipstick SMALL (*)   ?  Leukocytes,Ua LARGE (*)   ? WBC, UA >50 (*)   ? Bacteria, UA MANY (*)   ? All other components within normal limits  ?I-STAT CHEM 8, ED - Abnormal; Notable for the following components:  ? Creatinine, Ser 1.40 (*)   ? Glucose, Bld 107 (*)   ? Calcium, Ion 1.03 (*)   ? All other components within normal limits  ?CBG MONITORING, ED - Abnormal; Notable for the following components:  ? Glucose-Capillary 124 (*)   ? All other components within normal limits  ?RESP PANEL BY RT-PCR (FLU A&B, COVID) ARPGX2  ?URINE CULTURE  ?ETHANOL  ?PROTIME-INR  ?APTT  ?CBC  ?DIFFERENTIAL  ? ? ?EKG ?None ? ?Radiology ?CT HEAD CODE STROKE WO CONTRAST ? ?Result Date: 12/14/2021 ?CLINICAL DATA:  Provided history: Neuro deficit, acute, stroke suspected. EXAM: CT ANGIOGRAPHY HEAD AND NECK TECHNIQUE: Multidetector CT imaging of the head and neck was performed using the standard protocol during bolus administration of intravenous contrast. Multiplanar CT image reconstructions and MIPs were obtained to evaluate the vascular anatomy. Carotid stenosis measurements (when applicable) are obtained utilizing NASCET criteria, using the distal internal carotid diameter as the denominator. RADIATION DOSE REDUCTION: This exam was performed according to the departmental dose-optimization program which includes automated exposure control, adjustment of the mA and/or kV according to patient  size and/or use of iterative reconstruction technique. CONTRAST:  Administered contrast not known at this time COMPARISON:  Noncontrast head CT 06/26/2021. Brain MRI 08/22/2017. CT angiogram head/neck 02/14/2016.

## 2021-12-14 NOTE — Code Documentation (Signed)
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 She was in usual state of health today when her family noted her having an acute onset of trouble speaking and right sided weakness at 1330. EMS dispatched. Pt arrived to [facility] at 1516 via EMS. Labs, CBG obtained, airway cleared by EDPA. Pt to CTscan at 1525. Pt is slightly weaker on right, unable to state the month, and is having difficulty swallowing. CTNC neg for acute abnormality per Dr Wilford Corner. CTA obtained, which was negative for ELVO per Dr Wilford Corner. Pt taken to room 29 where her workup will continue. Pt not eligible for TNK as low suspicion for stroke, mild symptoms currently. Not candidate for Neuro Intervention as LVO negative. Pt will need q 30 min VS and NIHSS until 1800, (at which time she will be OOW for TNK), and then q 2 hr. Bedside handoff with Chloe RN. ?

## 2021-12-15 DIAGNOSIS — G9341 Metabolic encephalopathy: Secondary | ICD-10-CM

## 2021-12-15 LAB — CBC
HCT: 37.7 % (ref 36.0–46.0)
Hemoglobin: 12.8 g/dL (ref 12.0–15.0)
MCH: 30.3 pg (ref 26.0–34.0)
MCHC: 34 g/dL (ref 30.0–36.0)
MCV: 89.3 fL (ref 80.0–100.0)
Platelets: 230 10*3/uL (ref 150–400)
RBC: 4.22 MIL/uL (ref 3.87–5.11)
RDW: 12.9 % (ref 11.5–15.5)
WBC: 5.6 10*3/uL (ref 4.0–10.5)
nRBC: 0 % (ref 0.0–0.2)

## 2021-12-15 MED ORDER — CEFDINIR 300 MG PO CAPS: 300.0000 mg | ORAL_CAPSULE | Freq: Two times a day (BID) | ORAL | 0 refills | Status: AC

## 2021-12-15 MED ORDER — AMLODIPINE BESYLATE 5 MG PO TABS
5.0000 mg | ORAL_TABLET | Freq: Every day | ORAL | Status: DC
Start: 2021-12-15 — End: 2021-12-15
  Administered 2021-12-15: 5 mg via ORAL
  Filled 2021-12-15: qty 1

## 2021-12-15 NOTE — Discharge Summary (Signed)
? ?Name: [name] ?MRN: [id number] ?DOB: 06/18/1936 86 y.o. ?PCP: Tyson AliasVincent, Duncan Thomas, MD ? ?Date of Admission: 12/14/2021  3:17 PM ?Date of Discharge: 12/15/21 ?Attending Physician: Ginnie SmartHatcher, Jeffrey C, MD ? ?Discharge Diagnosis: ?1. Multifactorial encephalopathy ?2. Acute uncomplicated cystitis ?3. THC misuse ?4. Prerenal AKI ? ?Discharge Medications: ?Allergies as of 12/15/2021   ? ?   Reactions  ? Aspirin   ? REACTION: gastritis with GERD  ? Codeine Nausea Only  ? ?  ? ?  ?Medication List  ?  ? ?STOP taking these medications   ? ?Alphagan P 0.1 % Soln ?Generic drug: brimonidine ?  ?bimatoprost 0.03 % ophthalmic solution ?Commonly known as: LUMIGAN ?  ? ?  ? ?TAKE these medications   ? ?albuterol 108 (90 Base) MCG/ACT inhaler ?Commonly known as: Proventil HFA ?Inhale 1 puff into the lungs every 6 (six) hours as needed for wheezing or shortness of breath. ?  ?amLODipine 5 MG tablet ?Commonly known as: NORVASC ?Take 1 tablet (5 mg total) by mouth daily. ?  ?cefdinir 300 MG capsule ?Commonly known as: OMNICEF ?Take 1 capsule (300 mg total) by mouth 2 (two) times daily for 4 days. ?  ?cloNIDine 0.1 MG tablet ?Commonly known as: CATAPRES ?Take 1 tablet (0.1 mg total) by mouth 2 (two) times daily. ?  ?donepezil 5 MG tablet ?Commonly known as: ARICEPT ?Take 1 tablet (5 mg total) by mouth at bedtime. ?  ?gabapentin 100 MG capsule ?Commonly known as: NEURONTIN ?TAKE 2 CAPSULES BY MOUTH IN THE MORNING, AND 4 CAPSULES AT BEDTIME ?  ?lisinopril 40 MG tablet ?Commonly known as: ZESTRIL ?Take 1 tablet (40 mg total) by mouth daily. ?  ?nitroGLYCERIN 0.4 MG SL tablet ?Commonly known as: NITROSTAT ?NEW PRESCRIPTION REQUEST: DISSOLVE 1 TABLET UNDER THE TONGUE EVERY 5 MINUTES AS NEEDED FOR CHEST PAIN. DO NOT EXCEED A TOTAL OF 3 DOSES IN 15 MINUTES. ?  ?potassium chloride SA 20 MEQ tablet ?Commonly known as: KLOR-CON M ?Take 1 tablet (20 mEq total) by mouth daily. ?  ?pravastatin 20 MG tablet ?Commonly known as: PRAVACHOL ?Take  1 tablet (20 mg total) by mouth daily. ?  ? ?  ? ? ?Disposition and follow-up:   ?[name]  At the hospital follow up visit please address: ? ?THC Misuse: Patient denies marijuana use or CBD use, may have accidentally ingested food item with THC ?Acute uncomplicated cystitis: Discharged on cefdinir for total 5 days of treatment. ?Prerenal AKI: likely secondary to decreased p.o. intake, good UOP, low concern for obstructive pathology. Improved with fluid resuscitation. ? ?Labs / imaging needed at time of follow-up: None ? ?Pending labs/ test needing follow-up: None ? ?Follow-up Appointments: ? Follow-up Information   ? ? Tyson AliasVincent, Duncan Thomas, MD Follow up.   ?Specialty: Internal Medicine ?Why: Our office will call you to set up an appointment for a hospital follow up visit. If you do not hear from our office in one week please call the number above and ask for your appointment date and time. ?Contact information: ?[address] ?[address] ?[place] [address] ?[phone] ? ? ?  ?  ? ?  ?  ? ?  ? ? ?Hospital Course by Problem List: ? ?[name] is an 86 yo female with PMHx of dementia, glaucoma with prosthetic right eye, HTN, HLD, CAD, who [facility] for "strokelike symptoms" witnessed by family and came in as a code stroke and  also found to have possible UTI which may be the cause of or contributing to her acute symptoms. ? ?#Multifocal encephalopathy, resolved ?#Possible acute THC intoxication ?Patient presenting with acute confusion, gait abnormality, change in speech on and off. Presented to the ED as a code stroke and was seen by neurology. LKW 1330, NIHSS 3, low suspicion for stroke no intervention. CTH without evidence of acute intracranial abnormality.  CTA without LVO.  Differential included metabolic encephalopathy secondary to UTI versus new stroke though low suspicion for this.  Neurology ordered brain MRI to  fully rule out stroke. Notably, patient also tested positive for THC and acute confusion may be secondary to intoxication. Per family, patient back to her neurologic baseline prior to medicine admission. Has dementia and at baseline: alert and oriented to self, location, partly to situation, managing ADLs on her own.  Patient admitted to observation. MRI negative for stroke. Patient stable for discharge the following morning, no further confusion or focal neuro deficits. Dicussed treatment of UIT as below. Patient and family deny patient using THC products, may have been accidental ingestion, remains a mystery. Called patient's daughter Meriam Sprague and discussed discharge instructions and plan prior to discharge. Messaged [facility] to set up hospital follow up appointment. ?  ?#Acute uncomplicated cystitis ?Patient presented afebrile without leukocytosis, VSS. Patient presenting with dysuria, urinary urge, urinary frequency for the past several days. UA with many bacteria, >50 WBC, large leukocytes, urine culture pending. Low concern for  pyelonephritis. Patient received one dose of ceftriaxone in the ED and was discharged on cefdinir for total 5 day course, last day 4/25. Had improvement in dysuria following initiation of abx. ?  ?#Prerenal AKI ?Prerenal AKI likely secondary to decreased p.o. intake, UA with high specific gravity.  On arrival creatinine 1.38 with baseline creatinine ~0.9.  Having good urine output per ED RN, low concern for obstructive etiology. On 20 mEq potassium daily at home. On admission potassium 4.1, normal, supplementation held this admission and restarted at discharge. Creatinine improved with fluid resuscitation and suspect will continue to improve with po hydration. Encouraged fluid intake.  ? ?#HTN ?Home medications amlodipine 5 mg daily, lisinopril 40 mg daily. Family endorses adherence.  Currently holding antihypertensives in the setting of possible acute ischemic stroke amlodipine was  restarted the following morning when MRI brain came back negative for stroke. Home medications continued at discharge.  ?  ?#Dementia ?Patient continued on home donepezil 5 mg nightly.  Delirium precautions, high risk for developing delirium. Patient was at mental status baseline during this admission. ?  ?#HLD ?Patient continued on home pravastatin 20 mg daily. ? ?Subjective on day of discharge: Patient feels alright this morning. Has had an improvement in dysuria, has some lower abdominal tenderness. Discussed sending patient home today with PO antibiotics. Also discussed patient's positive UDS for THC, patient denies marijuana use, is surprised this was positive.  ? ?Called to update daughter, Meriam Sprague. Patient ready for discharge today, will need 4 more days abx, will need to take med twice a day. Discussed positive UDS for THC, remains a mystery. Messaged [facility] for hfu appointment next week. ? ?Discharge Exam:   ?BP 139/67 (BP Location: Left Arm)   Pulse 66   Temp 98.8 ?F (37.1 ?C) (Axillary)   Resp 20   Wt 68.8 kg   SpO2 97%   BMI 25.24 kg/m?  ?Discharge exam: ?General: Elderly African-American female, overweight, NAD ?HENT: External ears and nares appear normal ?EYES: conjunctiva non-erythematous, no scleral icterus, prosthetic right eye  with lid droop ?CV: regular rate, normal rhythm, no murmurs, rubs, gallops.  Trace lower extremity edema bilaterally, feet cool with 2+ pedal pulses ?Pulmonary: normal work of breathing on RA, lungs clear to auscultation, no rales, wheezes, rhonchi ?Abdominal: non-distended, soft, non-tender to palpation, normal BS ?Skin: Warm and dry, no rashes or lesions, very overgrown toenails ?Neurological: ?MS: awake, alert and oriented to self and location, did not know her birthday or month but knew the year was 2023. Normal speech.  Face appears symmetric, tongue midline. ?Motor: moves all extremities antigravity without drift ?Psych: normal affect ? ? ?Pertinent Labs, Studies,  and Procedures:  ? ?  Latest Ref Rng & Units 12/15/2021  ?  2:14 AM 12/14/2021  ?  3:27 PM 12/14/2021  ?  3:20 PM  ?CBC  ?WBC 4.0 - 10.5 K/uL 5.6    6.9    ?Hemoglobin 12.0 - 15.0 g/dL 32.2   02.5   42.7    ?Hematocrit 36.0

## 2021-12-15 NOTE — Plan of Care (Signed)
  Problem: Health Behavior/Discharge Planning: Goal: Ability to manage health-related needs will improve Outcome: Adequate for Discharge   

## 2021-12-15 NOTE — Progress Notes (Signed)
Nsg Discharge Note ? ?Admit Date:  12/14/2021 ?Discharge date: 12/15/2021 ?  ?[name] to be D/C'd Home per MD order.  AVS completed.  Patient/caregiver able to verbalize understanding. ? ?Discharge Medication: ?Allergies as of 12/15/2021   ? ?   Reactions  ? Aspirin   ? REACTION: gastritis with GERD  ? Codeine Nausea Only  ? ?  ? ?  ?Medication List  ?  ? ?STOP taking these medications   ? ?Alphagan P 0.1 % Soln ?Generic drug: brimonidine ?  ?bimatoprost 0.03 % ophthalmic solution ?Commonly known as: LUMIGAN ?  ? ?  ? ?TAKE these medications   ? ?albuterol 108 (90 Base) MCG/ACT inhaler ?Commonly known as: Proventil HFA ?Inhale 1 puff into the lungs every 6 (six) hours as needed for wheezing or shortness of breath. ?  ?amLODipine 5 MG tablet ?Commonly known as: NORVASC ?Take 1 tablet (5 mg total) by mouth daily. ?  ?cefdinir 300 MG capsule ?Commonly known as: OMNICEF ?Take 1 capsule (300 mg total) by mouth 2 (two) times daily for 4 days. ?  ?cloNIDine 0.1 MG tablet ?Commonly known as: CATAPRES ?Take 1 tablet (0.1 mg total) by mouth 2 (two) times daily. ?  ?donepezil 5 MG tablet ?Commonly known as: ARICEPT ?Take 1 tablet (5 mg total) by mouth at bedtime. ?  ?gabapentin 100 MG capsule ?Commonly known as: NEURONTIN ?TAKE 2 CAPSULES BY MOUTH IN THE MORNING, AND 4 CAPSULES AT BEDTIME ?  ?lisinopril 40 MG tablet ?Commonly known as: ZESTRIL ?Take 1 tablet (40 mg total) by mouth daily. ?  ?nitroGLYCERIN 0.4 MG SL tablet ?Commonly known as: NITROSTAT ?NEW PRESCRIPTION REQUEST: DISSOLVE 1 TABLET UNDER THE TONGUE EVERY 5 MINUTES AS NEEDED FOR CHEST PAIN. DO NOT EXCEED A TOTAL OF 3 DOSES IN 15 MINUTES. ?  ?potassium chloride SA 20 MEQ tablet ?Commonly known as: KLOR-CON M ?Take 1 tablet (20 mEq total) by mouth daily. ?  ?pravastatin 20 MG tablet ?Commonly known as: PRAVACHOL ?Take 1 tablet (20 mg total) by mouth daily. ?  ? ?  ? ? ?Discharge Assessment: ?Vitals:  ? 12/15/21 0329 12/15/21 1200  ?BP: 139/67 (!) 179/70  ?Pulse:  66 65  ?Resp: 20   ?Temp: 98.8 ?F (37.1 ?C)   ?SpO2: 97% 100%  ? Skin clean, dry and intact without evidence of skin break down, no evidence of skin tears noted. ?IV catheter discontinued intact. Site without signs and symptoms of complications - no redness or edema noted at insertion site, patient denies c/o pain - only slight tenderness at site.  Dressing with slight pressure applied. ? ?D/c Instructions-Education: ?Discharge instructions given to patient/family with verbalized understanding. ?D/c education completed with patient/family including follow up instructions, medication list, d/c activities limitations if indicated, with other d/c instructions as indicated by MD - patient able to verbalize understanding, all questions fully answered. ?Patient instructed to return to ED, call 911, or call MD for any changes in condition.  ?Patient escorted via WC, and D/C home via private auto. ? ?[name], [name], RN ?12/15/2021 1:09 PM  ?

## 2021-12-16 LAB — URINE CULTURE: Culture: >=100000 — AB

## 2021-12-19 ENCOUNTER — Telehealth: Payer: Self-pay | Admitting: Student in an Organized Health Care Education/Training Program

## 2021-12-19 NOTE — Telephone Encounter (Signed)
TOC HFU APPT ? ?  MRN: [id number]  ?Date: 12/28/2021 Status: Sch  ?Time: 9:45 AM Length: 30  ?Visit Type: OPEN ESTABLISHED [726] Copay: $0.00  ?Provider: Dellis Filbert, MD    ? ?

## 2021-12-20 ENCOUNTER — Telehealth: Payer: Self-pay

## 2021-12-20 NOTE — Telephone Encounter (Signed)
Transition Care Management Follow-up Telephone Call ?Date of discharge and from where: [facility] ?How have you been since you were released from the hospital? Per daughter Rise Paganini, patient is doing well, stronger. ?Any questions or concerns? No ? ?Items Reviewed: ?Did the pt receive and understand the discharge instructions provided? Yes  ?Medications obtained and verified? Yes  ?Other? No  ?Any new allergies since your discharge? No  ?Dietary orders reviewed? No ?Do you have support at home? Yes  ? ?Home Care and Equipment/Supplies: ?Were home health services ordered? no ?If so, what is the name of the agency? N/A  ?Has the agency set up a time to come to the patient's home? no ?Were any new equipment or medical supplies ordered?  No ?What is the name of the medical supply agency? N/A ?Were you able to get the supplies/equipment? not applicable ?Do you have any questions related to the use of the equipment or supplies? No ? ?Functional Questionnaire: (I = Independent and D = Dependent) ?ADLs: D ? ?Bathing/Dressing- D- Daughter assists with bathing ? ?Meal Prep- D- Daughter prepares meals ? ?Eating- I ? ?Maintaining continence- I ? ?Transferring/Ambulation- I ? ?Managing Meds- D- Daughter and Grand-daughter give meds ? ?Follow up appointments reviewed: ? ?PCP Hospital f/u appt confirmed? Yes  Scheduled to see Dr. Jeanice Lim on 12/28/21 @ 0945.[name] ?[facility] f/u appt confirmed?  N/A   ?Are transportation arrangements needed? Yes  ?If their condition worsens, is the pt aware to call PCP or go to the Emergency Dept.? Yes ?Was the patient provided with contact information for the PCP's office or ED? Yes ?Was to pt encouraged to call back with questions or concerns? Yes ?Johnney Killian, RN, BSN, CCM ?Care Management Coordinator ?[facility] ?Phone: [phone]: [phone]   ?

## 2021-12-28 ENCOUNTER — Ambulatory Visit (INDEPENDENT_AMBULATORY_CARE_PROVIDER_SITE_OTHER): Payer: Medicare Other | Admitting: Internal Medicine

## 2021-12-28 VITALS — BP 213/81 | HR 64 | Temp 97.9°F | Wt 148.8 lb

## 2021-12-28 DIAGNOSIS — I10 Essential (primary) hypertension: Secondary | ICD-10-CM | POA: Diagnosis not present

## 2021-12-28 DIAGNOSIS — G9341 Metabolic encephalopathy: Secondary | ICD-10-CM | POA: Diagnosis not present

## 2021-12-28 DIAGNOSIS — E876 Hypokalemia: Secondary | ICD-10-CM

## 2021-12-28 DIAGNOSIS — M21619 Bunion of unspecified foot: Secondary | ICD-10-CM | POA: Diagnosis not present

## 2021-12-28 DIAGNOSIS — N3000 Acute cystitis without hematuria: Secondary | ICD-10-CM | POA: Diagnosis not present

## 2021-12-28 MED ORDER — CLONIDINE HCL 0.1 MG PO TABS: 0.1000 mg | ORAL_TABLET | Freq: Two times a day (BID) | ORAL | 3 refills | Status: DC

## 2021-12-28 MED ORDER — AMLODIPINE BESYLATE 5 MG PO TABS: 5.0000 mg | ORAL_TABLET | Freq: Every day | ORAL | 3 refills | Status: DC

## 2021-12-28 MED ORDER — PRAVASTATIN SODIUM 20 MG PO TABS: 20.0000 mg | ORAL_TABLET | Freq: Every day | ORAL | 1 refills | Status: DC

## 2021-12-28 MED ORDER — LISINOPRIL 40 MG PO TABS: 40.0000 mg | ORAL_TABLET | Freq: Every day | ORAL | 3 refills | Status: DC

## 2021-12-28 NOTE — Progress Notes (Signed)
? ?CC: Hospital follow-up, hypokalemia ? ?HPI: ? ?[name] is a 86 y.o. female with a past medical history stated below and presents today for CC listed above. Please see problem based assessment and plan for additional details. ? ?Past Medical History:  ?Diagnosis Date  ? Arthritis   ? 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 Patient right eye is enucleated due to end stage glaucoma and she has a prosthesis in place. Left eye sees shadows and is bothered by bright lights. She wears specialty sunglasses which help her with sensitivity.   ? CAD (coronary artery disease)   ? nonobstructive, myoview normal 03/2007, done by Dr. Verl Blalock - EF = 75%  ? Dementia ([place])   ? Glaucoma   ? HLD (hyperlipidemia)   ? Hypertension   ? ? ?Current Outpatient Medications on File Prior to Visit  ?Medication Sig Dispense Refill  ? albuterol (PROVENTIL HFA) 108 (90 Base) MCG/ACT inhaler Inhale 1 puff into the lungs every 6 (six) hours as needed for wheezing or shortness of breath. (Patient not taking: Reported on 07/03/2020) 18 g 1  ? donepezil (ARICEPT) 5 MG tablet Take 1 tablet (5 mg total) by mouth at bedtime. 90 tablet 3  ? gabapentin (NEURONTIN) 100 MG capsule TAKE 2 CAPSULES BY MOUTH IN THE MORNING, AND 4 CAPSULES AT BEDTIME 180 capsule 3  ? nitroGLYCERIN (NITROSTAT) 0.4 MG SL tablet NEW PRESCRIPTION REQUEST: DISSOLVE 1 TABLET UNDER THE TONGUE EVERY 5 MINUTES AS NEEDED FOR CHEST PAIN. DO NOT EXCEED A TOTAL OF 3 DOSES IN 15 MINUTES. 75 tablet 0  ? potassium chloride SA (KLOR-CON) 20 MEQ tablet Take 1 tablet (20 mEq total) by mouth daily. 90 tablet 1  ? ?No current facility-administered medications on file prior to visit.  ? ? ?Family History  ?Problem Relation Age of Onset  ? Stroke Neg Hx   ? Cancer Neg Hx   ? ? ?Social History  ? ?Socioeconomic History  ? Marital status: Widowed  ?  Spouse name: Not on file  ? Number of children: 2  ? Years of education: Not on file  ? Highest education level:  12th grade  ?Occupational History  ? Occupation: retired  ?Tobacco Use  ? Smoking status: Former  ?  Types: Cigarettes  ? Smokeless tobacco: Never  ?Vaping Use  ? Vaping Use: Never used  ?Substance and Sexual Activity  ? Alcohol use: No  ? Drug use: No  ? Sexual activity: Not Currently  ?Other Topics Concern  ? Not on file  ?Social History Narrative  ? Has a son and a daughter, granddaughter spends a lot of time at her house. Sister and she live together for many years. Sister was living with her mother and [name] was married but the mother passed away and her husband passed away so they moved in together.  ?   ? Pt is right handed, widowed, and  just recently moved out of her 2 story house into a 1st floor apartment.  She drinks 2 glasses of green tea daily, and is somewhat active, though is limited by her inability to see. Pt previously ran an adult day care center.   ? ?Social Determinants of Health  ? ?Financial Resource Strain: Low Risk   ? Difficulty of Paying Living Expenses: Not very hard  ?Food Insecurity: No Food Insecurity  ? Worried About Charity fundraiser in the Last Year: Never true  ? Ran Out of Food  in the Last Year: Never true  ?Transportation Needs: No Transportation Needs  ? Lack of Transportation (Medical): No  ? Lack of Transportation (Non-Medical): No  ?Physical Activity: Inactive  ? Days of Exercise per Week: 0 days  ? Minutes of Exercise per Session: 0 min  ?Stress: No Stress Concern Present  ? Feeling of Stress : Not at all  ?Social Connections: Socially Isolated  ? Frequency of Communication with Friends and Family: More than three times a week  ? Frequency of Social Gatherings with Friends and Family: Never  ? Attends Religious Services: Never  ? Active Member of Clubs or Organizations: No  ? Attends Archivist Meetings: Never  ? Marital Status: Widowed  ?Intimate Partner Violence: At Risk  ? Fear of Current or Ex-Partner: Yes  ? Emotionally Abused: No  ? Physically  Abused: No  ? Sexually Abused: No  ? ? ?Review of Systems: ?ROS negative except for what is noted on the assessment and plan. ? ?Vitals:  ? 12/28/21 0959 12/28/21 1049  ?BP: (!) 212/59 (!) 213/81  ?Pulse: 66 64  ?Temp: 97.9 ?F (36.6 ?C)   ?TempSrc: Oral   ?SpO2: 99%   ?Weight: 148 lb 12.8 oz (67.5 kg)   ? ? ? ?Physical Exam: ?Constitutional: well-appearing elderly woman sitting in the chair, in no acute distress ?HENT: normocephalic atraumatic, mucous membranes moist ?Eyes: conjunctiva non-erythematous ?Neck: supple ?Cardiovascular: regular rate and rhythm, no m/r/g ?Pulmonary/Chest: normal work of breathing on room air, lungs clear to auscultation bilaterally ?Abdominal: soft, non-tender, non-distended ?MSK: normal bulk and tone ?Neurological: alert & oriented x 3, 5/5 strength in bilateral upper and lower extremities, normal gait ?Skin: warm and dry ?Psych: Normal mood and behavior ? ? ?Assessment & Plan:  ? ?See Encounters Tab for problem based charting. ? ?Patient discussed with Dr. Evette Doffing ?Timothy Lasso, M.D. ?[facility], PGY-1 ?Pager: [phone], Phone: [phone] ?Date 12/28/2021 Time 11:36 AM ? ?

## 2021-12-28 NOTE — Assessment & Plan Note (Addendum)
Has not taken meds for the past two days.  This has been a long-term issue, given age and refusal for home health assistance patient has a tendency to forget taking her medications.  Blood pressure today was elevated SBP over 200.  She is asymtomatic.  She was present with her daughter and brought her medications to the visit.  In the office, patient took her medications with water.  And waited for 15 minutes and her blood pressure was rechecked.  Patient not interested in home health assistance at this time.  The daughter states she will try to monitor her compliance with the medications. ? ?P: ?-Follow-up in 3 months, blood pressure check ?

## 2021-12-28 NOTE — Patient Instructions (Signed)
Thank you, Ms.Daneil Dolin for allowing [place] to provide your care today. Today we discussed how you have been feeling since discharged from the hospital; glad you are doing much better.   ? ?I have ordered the following labs for you: ? ? ?Lab Orders    ?     BMP8+Anion Gap     ? ?Tests ordered today: ? ?None ? ?Referrals ordered today:  ? ?Referral Orders  ?No referral(s) requested today  ?  ? ?I have ordered the following medication/changed the following medications:  ? ?Stop the following medications: ?Medications Discontinued During This Encounter  ?Medication Reason  ? lisinopril (ZESTRIL) 40 MG tablet Reorder  ? cloNIDine (CATAPRES) 0.1 MG tablet Reorder  ? amLODipine (NORVASC) 5 MG tablet Reorder  ?  ? ?Start the following medications: ?Meds ordered this encounter  ?Medications  ? amLODipine (NORVASC) 5 MG tablet  ?  Sig: Take 1 tablet (5 mg total) by mouth daily.  ?  Dispense:  90 tablet  ?  Refill:  3  ? cloNIDine (CATAPRES) 0.1 MG tablet  ?  Sig: Take 1 tablet (0.1 mg total) by mouth 2 (two) times daily.  ?  Dispense:  180 tablet  ?  Refill:  3  ? lisinopril (ZESTRIL) 40 MG tablet  ?  Sig: Take 1 tablet (40 mg total) by mouth daily.  ?  Dispense:  90 tablet  ?  Refill:  3  ?  ? ?Follow up: 3 months  ? ?Remember: Take your medications directed ? ?Should you have any questions or concerns please call the internal medicine clinic at [phone].   ? ?Dellis Filbert, MD ?[facility] ?  ?

## 2021-12-28 NOTE — Assessment & Plan Note (Addendum)
Patient was admitted to the hospital last month with acute encephalopathy.  Multifactorial.  There was suspicion for THC ingestion, possibly accidental.  Patient also had underlying UTI.  She was treated for UTI with antibiotics and at time of discharge patient was at baseline.  Patient has a history of dementia on donepezil.  On exam, patient was asked orientation questions, answer questions appropriately.  Alert and oriented x3. ? ?

## 2021-12-28 NOTE — Assessment & Plan Note (Addendum)
Patient was recently admitted last month for encephalopathy and found to have infection on UA.  Patient was treated with 5-day course of cefdinir at time of discharge.  Patient states she completed the course of antibiotics.  Urinary symptoms or changes at this time. ?

## 2021-12-28 NOTE — Assessment & Plan Note (Signed)
On prior admission, patient was found to be hypokalemic.  During hospitalization, potassium was repleted.  Patient denies any numbness, tingling or muscle cramping.  BMP ordered today. ?

## 2021-12-29 LAB — BMP8+ANION GAP
Anion Gap: 18 mmol/L (ref 10.0–18.0)
BUN/Creatinine Ratio: 11 — ABNORMAL LOW (ref 12–28)
BUN: 10 mg/dL (ref 8–27)
CO2: 22 mmol/L (ref 20–29)
Calcium: 9.6 mg/dL (ref 8.7–10.3)
Chloride: 104 mmol/L (ref 96–106)
Creatinine, Ser: 0.9 mg/dL (ref 0.57–1.00)
Glucose: 91 mg/dL (ref 70–99)
Potassium: 3.6 mmol/L (ref 3.5–5.2)
Sodium: 144 mmol/L (ref 134–144)
eGFR: 63 mL/min/{1.73_m2} (ref >59)

## 2021-12-31 NOTE — Progress Notes (Signed)
Internal Medicine Clinic Attending  I saw and evaluated the patient.  I personally confirmed the key portions of the history and exam documented by Dr. Ariwodo and I reviewed pertinent patient test results.  The assessment, diagnosis, and plan were formulated together and I agree with the documentation in the resident's note.   

## 2022-01-23 ENCOUNTER — Ambulatory Visit: Payer: Medicare Other

## 2022-01-23 ENCOUNTER — Ambulatory Visit: Payer: Medicare Other | Admitting: Podiatry

## 2022-01-23 DIAGNOSIS — M79675 Pain in left toe(s): Secondary | ICD-10-CM

## 2022-01-23 DIAGNOSIS — M79674 Pain in right toe(s): Secondary | ICD-10-CM

## 2022-01-23 DIAGNOSIS — B351 Tinea unguium: Secondary | ICD-10-CM | POA: Diagnosis not present

## 2022-01-23 DIAGNOSIS — M21611 Bunion of right foot: Secondary | ICD-10-CM

## 2022-01-23 NOTE — Progress Notes (Signed)
   SUBJECTIVE Patient presents to office today complaining of elongated, thickened nails that cause pain while ambulating in shoes.  Patient is unable to trim their own nails. Patient is here for further evaluation and treatment.  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 Left eye sees shadows and is bothered by bright lights. She wears specialty sunglasses which help her with sensitivity.    CAD (coronary artery disease)    nonobstructive, myoview normal 03/2007, done by Dr. Daleen Squibb - EF = 75%   Dementia ([facility])    Glaucoma    HLD (hyperlipidemia)    Hypertension     OBJECTIVE General Patient is awake, alert, and oriented x 3 and in no acute distress. Derm Skin is dry and supple bilateral. Negative open lesions or macerations. Remaining integument unremarkable. Nails are tender, long, thickened and dystrophic with subungual debris, consistent with onychomycosis, 1-5 bilateral. No signs of infection noted. Vasc  DP and PT pedal pulses palpable bilaterally. Temperature gradient within normal limits.  Neuro Epicritic and protective threshold sensation grossly intact bilaterally.  Musculoskeletal Exam No symptomatic pedal deformities noted bilateral. Muscular strength within normal limits.  ASSESSMENT 1.  Pain due to onychomycosis of toenails both  PLAN OF CARE 1. Patient evaluated today.  2. Instructed to maintain good pedal hygiene and foot care.  3. Mechanical debridement of nails 1-5 bilaterally performed using a nail nipper. Filed with dremel without incident.  4. Return to clinic in 3 mos.    Felecia Shelling, DPM [facility]  Dr. Felecia Shelling, DPM    [address] [place], [place] [address]                Office [phone]  Fax [phone]

## 2022-03-25 ENCOUNTER — Ambulatory Visit (INDEPENDENT_AMBULATORY_CARE_PROVIDER_SITE_OTHER): Payer: Medicare Other | Admitting: Student in an Organized Health Care Education/Training Program

## 2022-03-25 ENCOUNTER — Encounter: Payer: Self-pay | Admitting: Student in an Organized Health Care Education/Training Program

## 2022-03-25 DIAGNOSIS — G629 Polyneuropathy, unspecified: Secondary | ICD-10-CM | POA: Insufficient documentation

## 2022-03-25 DIAGNOSIS — I10 Essential (primary) hypertension: Secondary | ICD-10-CM

## 2022-03-25 DIAGNOSIS — G3184 Mild cognitive impairment, so stated: Secondary | ICD-10-CM | POA: Diagnosis not present

## 2022-03-25 DIAGNOSIS — Z87891 Personal history of nicotine dependence: Secondary | ICD-10-CM | POA: Diagnosis not present

## 2022-03-25 LAB — POCT GLYCOSYLATED HEMOGLOBIN (HGB A1C): Hemoglobin A1C: 5.3 % (ref 4.0–5.6)

## 2022-03-25 LAB — GLUCOSE, CAPILLARY: Glucose-Capillary: 97 mg/dL (ref 70–99)

## 2022-03-25 MED ORDER — AMLODIPINE BESYLATE 10 MG PO TABS: 10.0000 mg | ORAL_TABLET | Freq: Every day | ORAL | 3 refills | Status: DC

## 2022-03-25 NOTE — Patient Instructions (Addendum)
We enjoyed seeing you today [name]!  Medications Please discontinue the Clonidine and the Potassium Chloride.  We are increasing your dose of Amlodipine to 10mg . You can also take the Donepezil in the morning instead of at bedtime.   2. Lab Results We will call you with the results of your bloodwork today.   3.  [facility] We will provide information on [facility], please reach out to them directly for their services.

## 2022-03-25 NOTE — Progress Notes (Signed)
Attestation for Student Documentation:  I personally was present and performed or re-performed the history, physical exam and medical decision-making activities of this service and have verified that the service and findings are accurately documented in the student's note.  86 year old person here for follow up of hypertension and memory issues. She is having more frequent confusion events at home, especially if there is not immediate family support present. 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 Normal neuro exam today. She has been using Donepezil to slow progression, still having significant issues with medication consistency. Her goal is to stay in her house for as long as possible. We talked about safety risks, about a possibility of needing a higher level of care in the future. We talked about resources like [facility] that may improve her safety and time in her home, which she is interested in. We are stopping clonidine, a historic medication for her, but risk of rebound HTN is too high at this point with her medication inconsistency. Will follow up with her in a few months to check on her functional status.  Tyson Alias, MD 03/25/2022, 2:10 PM

## 2022-03-25 NOTE — Assessment & Plan Note (Addendum)
Patient has a new concern of neuropathy in her fingers distal to the DIP bilaterally. Patient is currently on gabapentin but frequently does not take it. Discussed with patient that taking gabapentin may help with symptoms, and will plan to check labs.   Plan: -Check CMP, B12, TSH, RPR, HIV, IFE PE FLC, a1C and glucose today

## 2022-03-25 NOTE — Progress Notes (Signed)
Subjective:   Patient ID: Judea Fennimore female   DOB: 28-Sep-1935 86 y.o.   MRN: [id number]  HPI: Ms.Zulema Zellars is a 86 y.o. person with a pmhx of HTN, age-related cognitive impairment who presents for follow-up of chronic conditions and an acute concern of episodes of slurred speech. Patient is accompanied by her daughter today who supplies some of the history.   Patient reports episodes of slurred speech starting 6 months ago that occur when she is waking from a nap and is associated with "jerking movements" of her arms, but is at times not associated with sleeping. These episodes last a few minutes in duration. Patient denies hemiparesis or facial droop. Patient's daughter supports patient at home and reports that patient sometimes asks her "do I live here" in patient's own home. Patient denies getting lost in other familiar places or on familiar routes. Patient reports family history of Parkinson's disease in her mom, which manifested in her 79s-60s.   Patient also reports a recent fall 2-3 weeks ago after tripping and hit her head, but denies losing consciousness. Patient endorses a dull headache in the back of her head but says it is manageable without pain medication. Patient's daughter reports that she helps her mom with medications and makes sure she takes them in the morning, but patient does not take her evening medications.   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 18 g 1   amLODipine (NORVASC) 10 MG tablet Take 1 tablet (10 mg total) by mouth daily. 90 tablet 3   donepezil (ARICEPT) 5 MG tablet Take 1 tablet (5 mg total) by mouth at bedtime. 90 tablet 3   gabapentin (NEURONTIN) 100 MG capsule TAKE 2 CAPSULES BY MOUTH IN THE MORNING, AND 4 CAPSULES AT BEDTIME 180 capsule 3   lisinopril (ZESTRIL) 40 MG tablet Take 1 tablet (40 mg total) by mouth daily. 90 tablet 3   nitroGLYCERIN (NITROSTAT) 0.4 MG SL tablet NEW PRESCRIPTION REQUEST: DISSOLVE 1 TABLET UNDER THE TONGUE EVERY 5 MINUTES AS NEEDED FOR CHEST PAIN. DO NOT EXCEED A TOTAL OF 3 DOSES IN 15 MINUTES. 75 tablet 0   pravastatin (PRAVACHOL) 20 MG tablet Take 1 tablet (20 mg total) by mouth daily. 90 tablet 1   No current facility-administered medications for this visit.     Review of Systems: Pertinent items noted in HPI and remainder of comprehensive ROS otherwise negative.  Objective:   Physical Exam: Vitals:   03/25/22 0834  BP: (!) 154/66  Pulse: 79  Temp: 98.1 F (36.7 C)  TempSrc: Oral  SpO2: 98%  Weight: 145 lb 11.2 oz (66.1 kg)  Height: 5\' 5"  (1.651 m)   Physical Exam Constitutional:      General: She is not in acute distress.  Cardiovascular:     Rate and Rhythm: Normal rate and regular rhythm.     Heart sounds: No murmur heard.    No friction rub. No gallop.     Comments: No peripheral edema.  Pulmonary:     Effort: Pulmonary effort is normal. No respiratory distress.     Breath sounds: Normal breath sounds. No wheezing.  Abdominal:     General: Abdomen is flat. There is no distension.     Palpations: Abdomen is soft.     Tenderness: There is no abdominal tenderness.  Skin:    General: Skin is warm and dry.  Neurological:     Mental  Status: Mental status is at baseline.     Gait: Gait normal.     Comments: No essential or pill-rolling tremor appreciated. No cogwheel rigidity. Working memory is normal.       Assessment & Plan:   Essential hypertension Patient has chronic poorly-controlled hypertension managed with amlodipine, lisinopril, and clonidine. Patient has a history of difficulty with adherence due to forgetfulness. Patient's daughter has been ensuring that she takes her morning medications but patient still does not consistently take her nighttime medications. BP today was 154/66, which is improved from previous visits. We will streamline her regimen to try to improve adherence, as well as decrease risk of orthostatic hypotension.  Plan: -Discontinue clonidine -Increase Amlodipine to 10mg  once daily  Mild cognitive impairment Patient has chronic cognitive impairment associated with age managed with donepezil that is progressing. Patient frequently does not take donepezil. Patient's daughter has a new concern that patient at times does not recognize her own home. Functional status otherwise is unchanged and patient's daughter and granddaughter provide support at home. Exam is not consistent with Parkinson's. There are no signs of TIA or CVA for the episodes of slurred speech.We discussed how donepezil will slow but not stop age-related memory impairment and instructed patient to take it in the morning with her other medications to improve adherence. Patient has a desire to stay in her home as long as possible, and we discussed the [facility] and encouraged patient to reach out to them for additional support services.   Neuropathy Patient has a new concern of neuropathy in her fingers distal to the DIP bilaterally. Patient is currently on gabapentin but frequently does not take it. Discussed with patient that taking gabapentin may help with symptoms, and will plan to check labs.   Plan: -Check CMP, B12, TSH,  RPR, HIV, IFE PE FLC, a1C and glucose today

## 2022-03-25 NOTE — Assessment & Plan Note (Addendum)
Patient has chronic poorly-controlled hypertension managed with amlodipine, lisinopril, and clonidine. Patient has a history of difficulty with adherence due to forgetfulness. Patient's daughter has been ensuring that she takes her morning medications but patient still does not consistently take her nighttime medications. BP today was 154/66, which is improved from previous visits. We will streamline her regimen to try to improve adherence, as well as decrease risk of orthostatic hypotension.  Plan: -Discontinue clonidine -Increase Amlodipine to 10mg  once daily

## 2022-03-25 NOTE — Assessment & Plan Note (Addendum)
Patient has chronic cognitive impairment associated with age managed with donepezil that is progressing. Patient frequently does not take donepezil. Patient's daughter has a new concern that patient at times does not recognize her own home. Functional status otherwise is unchanged and patient's daughter and granddaughter provide support at home. Exam is not consistent with Parkinson's. There are no signs of TIA or CVA for the episodes of slurred speech.We discussed how donepezil will slow but not stop age-related memory impairment and instructed patient to take it in the morning with her other medications to improve adherence. Patient has a desire to stay in her home as long as possible, and we discussed the [facility] and encouraged patient to reach out to them for additional support services.

## 2022-03-27 LAB — IFE, PE AND FLC, SERUM
Albumin SerPl Elph-Mcnc: 3.9 g/dL (ref 2.9–4.4)
Albumin/Glob SerPl: 1.2 (ref 0.7–1.7)
Alpha 1: 0.3 g/dL (ref 0.0–0.4)
Alpha2 Glob SerPl Elph-Mcnc: 0.9 g/dL (ref 0.4–1.0)
B-Globulin SerPl Elph-Mcnc: 1.2 g/dL (ref 0.7–1.3)
Gamma Glob SerPl Elph-Mcnc: 1 g/dL (ref 0.4–1.8)
Globulin, Total: 3.3 g/dL (ref 2.2–3.9)
Ig Kappa Free Light Chain: 24.5 mg/L — ABNORMAL HIGH (ref 3.3–19.4)
Ig Lambda Free Light Chain: 10.1 mg/L (ref 5.7–26.3)
IgA/Immunoglobulin A, Serum: 126 mg/dL (ref 64–422)
IgG (Immunoglobin G), Serum: 993 mg/dL (ref 586–1602)
IgM (Immunoglobulin M), Srm: 54 mg/dL (ref 26–217)
KAPPA/LAMBDA RATIO: 2.43 — ABNORMAL HIGH (ref 0.26–1.65)
Total Protein: 7.2 g/dL (ref 6.0–8.5)

## 2022-03-27 LAB — CMP14 + ANION GAP
ALT: 10 IU/L (ref 0–32)
AST: 15 IU/L (ref 0–40)
Albumin/Globulin Ratio: 1.6 (ref 1.2–2.2)
Albumin: 4.4 g/dL (ref 3.7–4.7)
Alkaline Phosphatase: 152 IU/L — ABNORMAL HIGH (ref 44–121)
Anion Gap: 19 mmol/L — ABNORMAL HIGH (ref 10.0–18.0)
BUN/Creatinine Ratio: 10 — ABNORMAL LOW (ref 12–28)
BUN: 9 mg/dL (ref 8–27)
Bilirubin Total: 0.3 mg/dL (ref 0.0–1.2)
CO2: 20 mmol/L (ref 20–29)
Calcium: 9.8 mg/dL (ref 8.7–10.3)
Chloride: 103 mmol/L (ref 96–106)
Creatinine, Ser: 0.91 mg/dL (ref 0.57–1.00)
Globulin, Total: 2.8 g/dL (ref 1.5–4.5)
Glucose: 94 mg/dL (ref 70–99)
Potassium: 4.3 mmol/L (ref 3.5–5.2)
Sodium: 142 mmol/L (ref 134–144)
eGFR: 62 mL/min/{1.73_m2} (ref >59)

## 2022-03-27 LAB — VITAMIN B12: Vitamin B-12: 349 pg/mL (ref 232–1245)

## 2022-03-27 LAB — RPR: RPR Ser Ql: NONREACTIVE

## 2022-03-27 LAB — HIV ANTIBODY (ROUTINE TESTING W REFLEX): HIV Screen 4th Generation wRfx: NONREACTIVE

## 2022-03-27 LAB — TSH: TSH: 1.31 u[IU]/mL (ref 0.450–4.500)

## 2022-03-29 ENCOUNTER — Encounter: Payer: Self-pay | Admitting: Student in an Organized Health Care Education/Training Program

## 2022-04-12 ENCOUNTER — Other Ambulatory Visit: Payer: Self-pay | Admitting: *Deleted

## 2022-04-12 MED ORDER — GABAPENTIN 100 MG PO CAPS: ORAL_CAPSULE | ORAL | 3 refills | Status: DC

## 2022-07-05 ENCOUNTER — Other Ambulatory Visit: Payer: Self-pay

## 2022-07-05 MED ORDER — LISINOPRIL 40 MG PO TABS: 40.0000 mg | ORAL_TABLET | Freq: Every day | ORAL | 3 refills | Status: DC

## 2022-10-21 ENCOUNTER — Other Ambulatory Visit: Payer: Self-pay | Admitting: Internal Medicine

## 2022-11-15 ENCOUNTER — Other Ambulatory Visit: Payer: Self-pay | Admitting: *Deleted

## 2022-11-15 MED ORDER — GABAPENTIN 100 MG PO CAPS: ORAL_CAPSULE | ORAL | 3 refills | Status: DC

## 2022-11-29 ENCOUNTER — Encounter: Payer: Self-pay | Admitting: Student

## 2022-11-29 ENCOUNTER — Other Ambulatory Visit: Payer: Self-pay

## 2022-11-29 ENCOUNTER — Ambulatory Visit (INDEPENDENT_AMBULATORY_CARE_PROVIDER_SITE_OTHER): Payer: Medicare Other | Admitting: Student

## 2022-11-29 ENCOUNTER — Ambulatory Visit (INDEPENDENT_AMBULATORY_CARE_PROVIDER_SITE_OTHER): Payer: Medicare Other

## 2022-11-29 ENCOUNTER — Other Ambulatory Visit (HOSPITAL_COMMUNITY)
Admission: RE | Admit: 2022-11-29 | Discharge: 2022-11-29 | Disposition: A | Discharge status: Home / Self Care | Payer: Medicare Other | Source: Ambulatory Visit | Attending: Internal Medicine | Admitting: Internal Medicine

## 2022-11-29 VITALS — BP 119/61 | HR 71 | Temp 98.0°F | Resp 24 | Ht 65.0 in | Wt 143.4 lb

## 2022-11-29 DIAGNOSIS — N760 Acute vaginitis: Secondary | ICD-10-CM | POA: Insufficient documentation

## 2022-11-29 DIAGNOSIS — R300 Dysuria: Secondary | ICD-10-CM | POA: Diagnosis not present

## 2022-11-29 DIAGNOSIS — Z0000 Encounter for general adult medical examination without abnormal findings: Secondary | ICD-10-CM | POA: Diagnosis not present

## 2022-11-29 DIAGNOSIS — N949 Unspecified condition associated with female genital organs and menstrual cycle: Secondary | ICD-10-CM

## 2022-11-29 LAB — POCT URINALYSIS DIPSTICK
Bilirubin, UA: NEGATIVE
Glucose, UA: NEGATIVE
Ketones, UA: NEGATIVE
Nitrite, UA: NEGATIVE
Protein, UA: POSITIVE — AB
Spec Grav, UA: 1.025 (ref 1.010–1.025)
Urobilinogen, UA: 0.2 E.U./dL
pH, UA: 5 (ref 5.0–8.0)

## 2022-11-29 NOTE — Progress Notes (Signed)
Subjective:   [name] is a 87 y.o. female who presents for Medicare Annual (Subsequent) preventive examination. I connected with  Diamond DolinAmelia Cogswell on 11/29/22 by a  Face-To-Face encounter  and verified that I am speaking with the correct person using two identifiers.  Patient Location: Other:  Office/Clinic  Provider Location: Office/Clinic  I discussed the limitations of evaluation and management by telemedicine. The patient expressed understanding and agreed to proceed.  Review of Systems    Defer to PCP       Objective:    Today's Vitals   11/29/22 1102  BP: 119/61  Pulse: 71  Resp: (!) 24  Temp: 98 F (36.7 C)  TempSrc: Oral  SpO2: 99%  Weight: 143 lb 6.4 oz (65 kg)  Height: 5\' 5"  (1.651 m)   Body mass index is 23.86 kg/m.     11/29/2022   11:03 AM 11/29/2022    9:19 AM 03/25/2022    8:38 AM 12/28/2021   10:00 AM 12/14/2021    3:59 PM 10/26/2021   11:27 AM 07/24/2021    3:38 PM  Advanced Directives  Does Patient Have a Medical Advance Directive? No No No No No Yes Yes  Does patient want to make changes to medical advance directive?       No - Patient declined  Would patient like information on creating a medical advance directive? No - Patient declined No - Patient declined No - Patient declined No - Patient declined       Current Medications (verified) Outpatient Encounter Medications as of 11/29/2022  Medication Sig   albuterol (PROVENTIL HFA) 108 (90 Base) MCG/ACT inhaler Inhale 1 puff into the lungs every 6 (six) hours as needed for wheezing or shortness of breath.   amLODipine (NORVASC) 10 MG tablet Take 1 tablet (10 mg total) by mouth daily.   donepezil (ARICEPT) 5 MG tablet Take 1 tablet (5 mg total) by mouth at bedtime.   gabapentin (NEURONTIN) 100 MG capsule TAKE 2 CAPSULES BY MOUTH IN THE MORNING, AND 4 CAPSULES AT BEDTIME   lisinopril (ZESTRIL) 40 MG tablet Take 1 tablet (40 mg total) by mouth daily.   nitroGLYCERIN (NITROSTAT) 0.4 MG SL tablet NEW  PRESCRIPTION REQUEST: DISSOLVE 1 TABLET UNDER THE TONGUE EVERY 5 MINUTES AS NEEDED FOR CHEST PAIN. DO NOT EXCEED A TOTAL OF 3 DOSES IN 15 MINUTES.   pravastatin (PRAVACHOL) 20 MG tablet TAKE 1 TABLET(20 MG) BY MOUTH DAILY   No facility-administered encounter medications on file as of 11/29/2022.    Allergies (verified) Aspirin and Codeine   History: Past Medical History:  Diagnosis Date   Arthritis    Blindness of right eye with low vision in contralateral eye 06/27/2011   Patient right eye is enucleated due to end stage glaucoma and she has a prosthesis in place. Left eye sees shadows and is bothered by bright lights. She wears specialty sunglasses which help her with sensitivity.    CAD (coronary artery disease)    nonobstructive, myoview normal 03/2007, done by Dr. Daleen SquibbWall - EF = 75%   Dementia    Glaucoma    HLD (hyperlipidemia)    Hypertension    Past Surgical History:  Procedure Laterality Date   CARDIAC CATHETERIZATION     CHOLECYSTECTOMY N/A 05/04/2020   Procedure: LAPAROSCOPIC CHOLECYSTECTOMY WITH INTRAOPERATIVE CHOLANGIOGRAM;  Surgeon: Griselda Mineroth, Paul III, MD;  [facility];  Service: General;  Laterality: N/A;   DILATION AND CURETTAGE OF UTERUS     ERCP N/A 05/05/2020  Procedure: ENDOSCOPIC RETROGRADE CHOLANGIOPANCREATOGRAPHY (ERCP);  Surgeon: Jeani HawkingHung, Patrick, MD;  [facility];  Service: Endoscopy;  Laterality: N/A;   EYE SURGERY     OCULAR PROSTHESIS REMOVAL     Left eye    REMOVAL OF STONES  05/05/2020   Procedure: REMOVAL OF STONES;  Surgeon: Jeani HawkingHung, Patrick, MD;  [facility];  Service: Endoscopy;;   SPHINCTEROTOMY  05/05/2020   Procedure: Dennison MascotSPHINCTEROTOMY;  Surgeon: Jeani HawkingHung, Patrick, MD;  [facility];  Service: Endoscopy;;   TONSILLECTOMY     Family History  Problem Relation Age of Onset   Stroke Neg Hx    Cancer Neg Hx    Social History   Socioeconomic History   Marital status: Widowed    Spouse name: Not on file   Number of children: 2    Years of education: Not on file   Highest education level: 12th grade  Occupational History   Occupation: retired  Tobacco Use   Smoking status: Former    Types: Cigarettes   Smokeless tobacco: Never  Building services engineerVaping Use   Vaping Use: Never used  Substance and Sexual Activity   Alcohol use: No   Drug use: No   Sexual activity: Not Currently  Other Topics Concern   Not on file  Social History Narrative   Has a son and a daughter, granddaughter spends a lot of time at her house. Sister and she live together for many years. Sister was living with her mother and Ms. [name] was married but the mother passed away and her husband passed away so they moved in together.      Pt is right handed, widowed, and  just recently moved out of her 2 story house into a 1st floor apartment.  She drinks 2 glasses of green tea daily, and is somewhat active, though is limited by her inability to see. Pt previously ran an adult day care center.    Social Determinants of Health   Financial Resource Strain: High Risk (11/29/2022)   Overall Financial Resource Strain (CARDIA)    Difficulty of Paying Living Expenses: Hard  Food Insecurity: Food Insecurity Present (11/29/2022)   Hunger Vital Sign    Worried About Running Out of Food in the Last Year: Often true    Ran Out of Food in the Last Year: Often true  Transportation Needs: No Transportation Needs (11/29/2022)   PRAPARE - Administrator, Civil ServiceTransportation    Lack of Transportation (Medical): No    Lack of Transportation (Non-Medical): No  Physical Activity: Inactive (11/29/2022)   Exercise Vital Sign    Days of Exercise per Week: 0 days    Minutes of Exercise per Session: 0 min  Stress: No Stress Concern Present (11/29/2022)   [employer] - Occupational Stress Questionnaire    Feeling of Stress : Not at all  Social Connections: Socially Isolated (11/29/2022)   Social Connection and Isolation Panel [NHANES]    Frequency of Communication with Friends and  Family: Three times a week    Frequency of Social Gatherings with Friends and Family: Twice a week    Attends Religious Services: Never    Database administratorActive Member of Clubs or Organizations: No    Attends BankerClub or Organization Meetings: Never    Marital Status: Widowed    Tobacco Counseling Counseling given: Not Answered   Clinical Intake:  Pre-visit preparation completed: Yes  Pain : No/denies pain     Nutritional Risks: None Diabetes: No  How often do you need to have  someone help you when you read instructions, pamphlets, or other written materials from your doctor or pharmacy?: 1 - Never What is the last grade level you completed in school?: 12th grade  Diabetic?No  Interpreter Needed?: No  Information entered by :: Dimitri Dsouza,cma   Activities of Daily Living    11/29/2022   11:04 AM 11/29/2022    9:14 AM  In your present state of health, do you have any difficulty performing the following activities:  Hearing? 0 0  Vision? 1 1  Comment  Blind  Difficulty concentrating or making decisions? 0 0  Walking or climbing stairs? 1 1  Comment  Vision  Dressing or bathing? 0 0  Doing errands, shopping? 1 1    Patient Care Team: Tyson Alias, MD as PCP - General (Internal Medicine)  Indicate any recent Medical Services you may have received from other than [facility] providers in the past year (date may be approximate).     Assessment:   This is a routine wellness examination for [place].  Hearing/Vision screen No results found.  Dietary issues and exercise activities discussed:     Goals Addressed   None   Depression Screen    11/29/2022   11:04 AM 11/29/2022    9:23 AM 03/25/2022   10:05 AM 10/26/2021   11:23 AM 07/03/2021    9:33 AM 06/21/2021   10:19 AM 06/12/2020   10:53 AM  PHQ 2/9 Scores  PHQ - 2 Score 0 0 0 2 0 3 4  PHQ- 9 Score    3  9 15     Fall Risk    11/29/2022   11:03 AM 11/29/2022    9:13 AM 03/25/2022   10:06 AM 10/26/2021   11:32 AM 07/02/2021    10:25 AM  Fall Risk   Falls in the past year? 1 0 1 1 1   Number falls in past yr: 0 0 0 0 1  Injury with Fall? 0 0 1 0 1  Risk for fall due to : Impaired balance/gait;Impaired mobility Impaired balance/gait;Impaired mobility Impaired vision Impaired vision Impaired vision;Impaired balance/gait  Risk for fall due to: Comment  Visual problems     Follow up Falls evaluation completed;Falls prevention discussed Falls evaluation completed;Falls prevention discussed Falls evaluation completed Falls evaluation completed Falls evaluation completed    FALL RISK PREVENTION PERTAINING TO THE HOME:  Any stairs in or around the home? Yes  If so, are there any without handrails? No  Home free of loose throw rugs in walkways, pet beds, electrical cords, etc? No  Adequate lighting in your home to reduce risk of falls? Yes   ASSISTIVE DEVICES UTILIZED TO PREVENT FALLS:  Life alert? Yes  Use of a cane, walker or w/c? Yes  Grab bars in the bathroom? Yes  Shower chair or bench in shower? Yes  Elevated toilet seat or a handicapped toilet? Yes   TIMED UP AND GO:  Was the test performed? No .  Length of time to ambulate 10 feet: 0 sec.   Gait slow and steady with assistive device  Cognitive Function:        11/29/2022   11:04 AM 10/26/2021   11:32 AM  6CIT Screen  What Year? 0 points 0 points  What month? 0 points 0 points  What time? 0 points 0 points  Count back from 20 0 points 2 points  Months in reverse 0 points 0 points  Repeat phrase 0 points 0 points  Total Score 0  points 2 points    Immunizations Immunization History  Administered Date(s) Administered   Fluad Quad(high Dose 65+) 06/21/2021   H1N1 10/10/2008   Influenza Split 06/27/2011, 05/08/2012   Influenza Whole 06/10/2008, 05/25/2009   Influenza,inj,Quad PF,6+ Mos 05/03/2014, 04/15/2016, 05/19/2017, 09/21/2018, 06/12/2020   PFIZER(Purple Top)SARS-COV-2 Vaccination 09/12/2019, 10/03/2019, 05/31/2020   Pneumococcal  Conjugate-13 09/20/2014   Pneumococcal Polysaccharide-23 06/27/2011   Tdap 06/27/2011, 07/02/2021    TDAP status: Up to date  Flu Vaccine status: Up to date  Pneumococcal vaccine status: Up to date  Covid-19 vaccine status: Completed vaccines  Qualifies for Shingles Vaccine? No   Zostavax completed No   Shingrix Completed?: No.    Education has been provided regarding the importance of this vaccine. Patient has been advised to call insurance company to determine out of pocket expense if they have not yet received this vaccine. Advised may also receive vaccine at local pharmacy or Health Dept. Verbalized acceptance and understanding.  Screening Tests Health Maintenance  Topic Date Due   Zoster Vaccines- Shingrix (1 of 2) Never done   COVID-19 Vaccine (4 - 2023-24 season) 04/26/2022   INFLUENZA VACCINE  03/27/2023   Medicare Annual Wellness (AWV)  11/29/2023   DTaP/Tdap/Td (3 - Td or Tdap) 07/03/2031   Pneumonia Vaccine 98+ Years old  Completed   DEXA SCAN  Completed   HPV VACCINES  Aged Out    Health Maintenance  Health Maintenance Due  Topic Date Due   Zoster Vaccines- Shingrix (1 of 2) Never done   COVID-19 Vaccine (4 - 2023-24 season) 04/26/2022    Lung Cancer Screening: (Low Dose CT Chest recommended if Age 36-80 years, 30 pack-year currently smoking OR have quit w/in 15years.) does not qualify.   Lung Cancer Screening Referral: N/A  Additional Screening:  Hepatitis C Screening: does not qualify; Completed N/A  Vision Screening: Recommended annual ophthalmology exams for early detection of glaucoma and other disorders of the eye. Is the patient up to date with their annual eye exam?  No  Who is the provider or what is the name of the office in which the patient attends annual eye exams? N/A If pt is not established with a provider, would they like to be referred to a provider to establish care? No .   Dental Screening: Recommended annual dental exams for proper  oral hygiene  Community Resource Referral / Chronic Care Management: CRR required this visit?  No   CCM required this visit?  No      Plan:     I have personally reviewed and noted the following in the patient's chart:   Medical and social history Use of alcohol, tobacco or illicit drugs  Current medications and supplements including opioid prescriptions. Patient is not currently taking opioid prescriptions. Functional ability and status Nutritional status Physical activity Advanced directives List of other physicians Hospitalizations, surgeries, and ER visits in previous 12 months Vitals Screenings to include cognitive, depression, and falls Referrals and appointments  In addition, I have reviewed and discussed with patient certain preventive protocols, quality metrics, and best practice recommendations. A written personalized care plan for preventive services as well as general preventive health recommendations were provided to patient.     Cala Bradford, [facility]   11/29/2022   Nurse Notes: Face-To-Face Visit  Ms. Storck , Thank you for taking time to come for your Medicare Wellness Visit. I appreciate your ongoing commitment to your health goals. Please review the following plan we discussed and let me know if  I can assist you in the future.   These are the goals we discussed:  Goals   None     This is a list of the screening recommended for you and due dates:  Health Maintenance  Topic Date Due   Zoster (Shingles) Vaccine (1 of 2) Never done   COVID-19 Vaccine (4 - 2023-24 season) 04/26/2022   Flu Shot  03/27/2023   Medicare Annual Wellness Visit  11/29/2023   DTaP/Tdap/Td vaccine (3 - Td or Tdap) 07/03/2031   Pneumonia Vaccine  Completed   DEXA scan (bone density measurement)  Completed   HPV Vaccine  Aged Out

## 2022-11-29 NOTE — Patient Instructions (Addendum)
It was a pleasure seeing you in clinic today We will check your urine and vaginal swab and I will call with the results  Please follow up with podiatry for your toenails the contact information is below [address] [place], [place] [address]                Office [phone]  Fax [phone]  Follow up in 3 month

## 2022-11-30 LAB — URINALYSIS, ROUTINE W REFLEX MICROSCOPIC
Bilirubin, UA: NEGATIVE
Glucose, UA: NEGATIVE
Ketones, UA: NEGATIVE
Nitrite, UA: POSITIVE — AB
Specific Gravity, UA: 1.018 (ref 1.005–1.030)
Urobilinogen, Ur: 0.2 mg/dL (ref 0.2–1.0)
pH, UA: 5 (ref 5.0–7.5)

## 2022-11-30 LAB — MICROSCOPIC EXAMINATION
Casts: NONE SEEN /lpf
WBC, UA: >30 /hpf — AB (ref 0–5)

## 2022-12-02 LAB — CERVICOVAGINAL ANCILLARY ONLY
Bacterial Vaginitis (gardnerella): POSITIVE — AB
Candida Glabrata: NEGATIVE
Candida Vaginitis: NEGATIVE
Chlamydia: NEGATIVE
Comment: NEGATIVE
Comment: NEGATIVE
Comment: NEGATIVE
Comment: NEGATIVE
Comment: NEGATIVE
Comment: NORMAL
Neisseria Gonorrhea: NEGATIVE
Trichomonas: NEGATIVE

## 2022-12-03 ENCOUNTER — Other Ambulatory Visit: Payer: Self-pay | Admitting: Student

## 2022-12-03 DIAGNOSIS — E7989 Other specified disorders of purine and pyrimidine metabolism: Secondary | ICD-10-CM

## 2022-12-03 DIAGNOSIS — N949 Unspecified condition associated with female genital organs and menstrual cycle: Secondary | ICD-10-CM | POA: Insufficient documentation

## 2022-12-03 DIAGNOSIS — R82998 Other abnormal findings in urine: Secondary | ICD-10-CM

## 2022-12-03 MED ORDER — METRONIDAZOLE 500 MG PO TABS: 500.0000 mg | ORAL_TABLET | Freq: Two times a day (BID) | ORAL | 0 refills | Status: AC

## 2022-12-03 NOTE — Assessment & Plan Note (Addendum)
Patient presents for 2 months of vaginal discomfort. States there is discomfort in the vulva. Denies an vaginal bleeding or discharge. Denies urinary symptoms or gross hematuria. Has not been having issues with stress incontinence lately. She has been taking cranberry supplements without improvement. Urine dipstick in office today positive for blood, and pyuria. Will check UA with microscopy and culture and vaginal swab.

## 2022-12-03 NOTE — Progress Notes (Signed)
Established Patient Office Visit  Subjective   Patient ID: [name], female    DOB: Jan 04, 1936  Age: 87 y.o. MRN: [id number]  Chief Complaint  Patient presents with   Urinary Tract Infection   Medication Refill    Pain med     [name] is a 87 y.o. person living with a history listed below who presents to clinic for vaginal discomfort. Please refer to problem based charting for further details and assessment and plan of current problem and chronic medical conditions.     Patient Active Problem List   Diagnosis Date Noted   Vaginal discomfort 12/03/2022   Neuropathy 03/25/2022   Acute cystitis 12/28/2021   Acute metabolic encephalopathy 12/14/2021   Hyperlipidemia 05/25/2019   Cervical osteoarthritis 02/17/2017   Mild cognitive impairment 08/05/2016   Stress incontinence 06/20/2016   Calcification of aorta 04/15/2016   At high risk for falls 04/15/2016   Hypokalemia 03/07/2016   Health care maintenance 09/21/2014   Blindness of right eye with low vision in contralateral eye 06/27/2011   Glaucoma 04/22/2007   Essential hypertension 04/22/2007      Review of Systems  Constitutional:  Negative for chills and fever.  Respiratory:  Negative for cough and shortness of breath.   Cardiovascular:  Negative for chest pain and palpitations.  Gastrointestinal:  Negative for abdominal pain, nausea and vomiting.  Genitourinary:  Negative for dysuria, flank pain, hematuria and urgency.       Vaginal discomfort  Neurological:  Negative for focal weakness.    Objective:     BP 119/61 (BP Location: Right Arm, Patient Position: Sitting, Cuff Size: Normal)   Pulse 71   Temp 98 F (36.7 C) (Oral)   Resp (!) 24   Ht 5\' 5"  (1.651 m)   Wt 143 lb 6.4 oz (65 kg)   SpO2 99% Comment: room air  BMI 23.86 kg/m  BP Readings from Last 3 Encounters:  11/29/22 119/61  11/29/22 119/61  03/25/22 (!) 154/66      Physical Exam Exam conducted with a chaperone present.   Constitutional:      General: She is not in acute distress.    Appearance: Normal appearance.  HENT:     Head: Normocephalic and atraumatic.     Mouth/Throat:     Mouth: Mucous membranes are moist.     Pharynx: Oropharynx is clear.  Eyes:     Extraocular Movements: Extraocular movements intact.     Conjunctiva/sclera: Conjunctivae normal.     Pupils: Pupils are equal, round, and reactive to light.  Cardiovascular:     Rate and Rhythm: Normal rate and regular rhythm.     Heart sounds: No murmur heard. Pulmonary:     Effort: Pulmonary effort is normal.     Breath sounds: No rhonchi or rales.  Abdominal:     General: Abdomen is flat. Bowel sounds are normal. There is no distension.     Palpations: Abdomen is soft. There is no mass.     Tenderness: There is no abdominal tenderness. There is no right CVA tenderness, left CVA tenderness or guarding.  Genitourinary:    General: Normal vulva.     Exam position: Lithotomy position.     Pubic Area: No rash.      Labia:        Right: No rash or lesion.        Left: No rash or lesion.      Vagina: Normal. No vaginal discharge.  Cervix: Normal.  Musculoskeletal:        General: Normal range of motion.     Right lower leg: No edema.     Left lower leg: No edema.  Skin:    General: Skin is warm and dry.     Capillary Refill: Capillary refill takes less than 2 seconds.  Neurological:     General: No focal deficit present.     Mental Status: She is alert and oriented to person, place, and time.  Psychiatric:        Mood and Affect: Mood normal.        Behavior: Behavior normal.     Results for orders placed or performed in visit on 11/29/22  Microscopic Examination   Urine  Result Value Ref Range   WBC, UA >30 (A) 0 - 5 /hpf   RBC, Urine 0-2 0 - 2 /hpf   Epithelial Cells (non renal) 0-10 0 - 10 /hpf   Casts None seen None seen /lpf   Crystals Present (A) N/A   Crystal Type Uric Acid N/A   Bacteria, UA Many (A) None  seen/Few  Urinalysis, Reflex Microscopic  Result Value Ref Range   Specific Gravity, UA 1.018 1.005 - 1.030   pH, UA 5.0 5.0 - 7.5   Color, UA Yellow Yellow   Appearance Ur Turbid (A) Clear   Leukocytes,UA 2+ (A) Negative   Protein,UA 1+ (A) Negative/Trace   Glucose, UA Negative Negative   Ketones, UA Negative Negative   RBC, UA 2+ (A) Negative   Bilirubin, UA Negative Negative   Urobilinogen, Ur 0.2 0.2 - 1.0 mg/dL   Nitrite, UA Positive (A) Negative   Microscopic Examination See below:   POCT urinalysis dipstick  Result Value Ref Range   Color, UA Yellow    Clarity, UA Cloudy    Glucose, UA Negative Negative   Bilirubin, UA Negative    Ketones, UA Negative    Spec Grav, UA 1.025 1.010 - 1.025   Blood, UA Moderate    pH, UA 5.0 5.0 - 8.0   Protein, UA Positive (A) Negative   Urobilinogen, UA 0.2 0.2 or 1.0 E.U./dL   Nitrite, UA Negative    Leukocytes, UA Small (1+) (A) Negative  Cervicovaginal ancillary only  Result Value Ref Range   Neisseria Gonorrhea Negative    Chlamydia Negative    Trichomonas Negative    Bacterial Vaginitis (gardnerella) Positive (A)    Candida Vaginitis Negative    Candida Glabrata Negative    Comment      Normal Reference Range Bacterial Vaginosis - Negative   Comment Normal Reference Range Candida Species - Negative    Comment Normal Reference Range Candida Galbrata - Negative    Comment Normal Reference Range Trichomonas - Negative    Comment Normal Reference Ranger Chlamydia - Negative    Comment      Normal Reference Range Neisseria Gonorrhea - Negative      Assessment & Plan:   Problem List Items Addressed This Visit       Other   Vaginal discomfort    Patient presents for 2 months of vaginal discomfort. States there is discomfort in the vulva. Denies urinary symptoms or gross hematuria. Has not been having issues with stress incontinence lately. She has been taking cranberry supplements without improvement. Urine dipstick in  office today positive for blood, and pyuria. Will check UA with microscopy and culture and vaginal swab.       Other Visit Diagnoses  Dysuria    -  Primary   Relevant Orders   POCT urinalysis dipstick (Completed)   Urinalysis, Reflex Microscopic (Completed)   Culture, Urine   Acute vaginitis       Relevant Orders   Cervicovaginal ancillary only (Completed)       Return in about 3 months (around 02/28/2023).    Quincy Simmonds, MD

## 2022-12-04 LAB — URINE CULTURE

## 2022-12-04 NOTE — Progress Notes (Signed)
Internal Medicine Clinic Attending  Case discussed with Dr. Liang  at the time of the visit.  We reviewed the resident's history and exam and pertinent patient test results.  I agree with the assessment, diagnosis, and plan of care documented in the resident's note.  

## 2022-12-12 NOTE — Progress Notes (Signed)
Internal Medicine Clinic Attending  Case and documentation of Dr. Liang  soon after the resident saw the patient reviewed.  I reviewed the AWV findings.  I agree with the assessment, diagnosis, and plan of care documented in the AWV note.     

## 2022-12-26 ENCOUNTER — Ambulatory Visit (HOSPITAL_COMMUNITY): Payer: Medicare Other | Attending: Internal Medicine

## 2023-01-10 ENCOUNTER — Telehealth: Payer: Self-pay

## 2023-01-10 NOTE — Telephone Encounter (Signed)
I told him and he will resend the paper  to our fax  number that I confirmed  with him also ....  He will also call pt and explain that she needs  to speak to her PCP  about the need for the brace

## 2023-01-10 NOTE — Telephone Encounter (Signed)
No I haven't seen anything.

## 2023-01-10 NOTE — Telephone Encounter (Signed)
No, I have not seen any paperwork for this pt. Pt has an appointment on Monday with her PCP, she can discuss this with her doctor.

## 2023-01-10 NOTE — Telephone Encounter (Signed)
has anyone seen a DME form from [facility] pharmacy for pt knee and ankle support brace it was sent over on 5/13  rep is david  tele [phone].Marland KitchenMarland Kitchen

## 2023-01-13 ENCOUNTER — Encounter: Payer: Medicare Other | Admitting: Student in an Organized Health Care Education/Training Program

## 2023-03-17 ENCOUNTER — Other Ambulatory Visit: Payer: Self-pay | Admitting: Student

## 2023-04-07 ENCOUNTER — Ambulatory Visit: Payer: Medicare HMO | Admitting: Student in an Organized Health Care Education/Training Program

## 2023-04-07 ENCOUNTER — Encounter: Payer: Self-pay | Admitting: Student in an Organized Health Care Education/Training Program

## 2023-04-07 VITALS — BP 156/56 | HR 76 | Temp 98.2°F | Ht 65.0 in | Wt 149.1 lb

## 2023-04-07 DIAGNOSIS — I10 Essential (primary) hypertension: Secondary | ICD-10-CM

## 2023-04-07 DIAGNOSIS — E785 Hyperlipidemia, unspecified: Secondary | ICD-10-CM

## 2023-04-07 DIAGNOSIS — Z87891 Personal history of nicotine dependence: Secondary | ICD-10-CM

## 2023-04-07 DIAGNOSIS — B351 Tinea unguium: Secondary | ICD-10-CM | POA: Diagnosis not present

## 2023-04-07 DIAGNOSIS — G3184 Mild cognitive impairment, so stated: Secondary | ICD-10-CM

## 2023-04-07 DIAGNOSIS — E7800 Pure hypercholesterolemia, unspecified: Secondary | ICD-10-CM | POA: Diagnosis not present

## 2023-04-07 MED ORDER — AMLODIPINE BESYLATE 10 MG PO TABS
10.0000 mg | ORAL_TABLET | Freq: Every day | ORAL | 3 refills | Status: DC
Start: 2023-04-07 — End: 2024-06-02

## 2023-04-07 MED ORDER — GABAPENTIN 100 MG PO CAPS: ORAL_CAPSULE | ORAL | 3 refills | Status: DC

## 2023-04-07 MED ORDER — PRAVASTATIN SODIUM 20 MG PO TABS
20.0000 mg | ORAL_TABLET | Freq: Every day | ORAL | 3 refills | Status: DC
Start: 2023-04-07 — End: 2024-06-02

## 2023-04-07 MED ORDER — LISINOPRIL 40 MG PO TABS
40.0000 mg | ORAL_TABLET | Freq: Every day | ORAL | 3 refills | Status: DC
Start: 2023-04-07 — End: 2023-06-16

## 2023-04-07 NOTE — Progress Notes (Signed)
   Established Patient Office Visit  Subjective   Patient ID: [name], female    DOB: [birth year]  Age: 87 y.o. MRN: [id number]  Chief Complaint  Patient presents with   Follow-up    HPI  87 year old person here for management of hypertension.  Doing well since I last saw her about 1 year ago.  She has at least moderate cognitive impairments and visual impairment.  Thankfully she has a devoted family that helps to care for her.  Reports inconsistent use of medications, no side effects from them but does not like taking tablets.  Does have organized pillbox that her family helps her with.  Reports yesterday was the last time she took her medications.  No recent fevers or chills.  No hospitalizations.  No falls at home.  No emergency department visits.  Has not seen any other physicians over the last 1 year.  No issues with urination.  Reports steady ambulation.  Family prepares her meals.  She is independent in her other activities of daily living.    Objective:     BP (!) 156/56 (BP Location: Right Arm, Patient Position: Sitting, Cuff Size: Small)   Pulse 76   Temp 98.2 F (36.8 C) (Oral)   Ht 5\' 5"  (1.651 m)   Wt 149 lb 1.6 oz (67.6 kg)   SpO2 98%   BMI 24.81 kg/m    Physical Exam  General: Well-appearing woman, mildly frail Neuro: Alert, conversational, full strength in the upper and lower extremities, slightly delayed get up and go but mostly due to her visual impairment Neck: Normal, normal thyroid, no lymphadenopathy Lungs: Clear throughout Heart: Regular rate, no murmurs Abdomen: Soft, nontender, nondistended, no organomegaly Extremities: Warm and well-perfused with no lower extremity edema Skin: No rashes or lesions     Assessment & Plan:   Problem List Items Addressed This Visit       Medium    Essential hypertension (Chronic)    Blood pressure little elevated above goal.  She is inconsistent with taking medications, does not like using tablets, no side  effects.  With the inconsistent use I think adding another agent is more risk than benefit.  We talked about continuing with lisinopril 40 mg and amlodipine 10 mg daily.  Will check BMP today.      Relevant Medications   amLODipine (NORVASC) 10 MG tablet   lisinopril (ZESTRIL) 40 MG tablet   pravastatin (PRAVACHOL) 20 MG tablet   Other Relevant Orders   BMP8+Anion Gap   Mild cognitive impairment - Primary (Chronic)    Functionally doing well at home with a lot of support from family.  Likely has developing vascular dementia, but not much functional change since last year's visit.  Patient stopped taking donepezil because of dislike of medications, which I think is fine.  We talked about safety at home, she has a life alert, lives with family, they have no safety concerns right now.        Low   Hyperlipidemia (Chronic)    History of prior cerebral infarction.  Tolerating pravastatin well but inconsistent use.  Will check lipids today.      Relevant Medications   amLODipine (NORVASC) 10 MG tablet   lisinopril (ZESTRIL) 40 MG tablet   pravastatin (PRAVACHOL) 20 MG tablet   Other Relevant Orders   Lipid Profile    Return in about 1 year (around 04/06/2024).    Tyson Alias, MD

## 2023-04-07 NOTE — Assessment & Plan Note (Signed)
Functionally doing well at home with a lot of support from family.  Likely has developing vascular dementia, but not much functional change since last year's visit.  Patient stopped taking donepezil because of dislike of medications, which I think is fine.  We talked about safety at home, she has a life alert, lives with family, they have no safety concerns right now.

## 2023-04-07 NOTE — Assessment & Plan Note (Signed)
Patient reports thickened and elongated toenails on both feet which cause her discomfort and pain while ambulating in shoes.  She has difficulty cutting her own toenails because of her visual impairments.  She declined a foot exam by me today, she requests to be referred to podiatry which I will do.

## 2023-04-07 NOTE — Assessment & Plan Note (Signed)
Blood pressure little elevated above goal.  She is inconsistent with taking medications, does not like using tablets, no side effects.  With the inconsistent use I think adding another agent is more risk than benefit.  We talked about continuing with lisinopril 40 mg and amlodipine 10 mg daily.  Will check BMP today.

## 2023-04-07 NOTE — Assessment & Plan Note (Signed)
History of prior cerebral infarction.  Tolerating pravastatin well but inconsistent use.  Will check lipids today.

## 2023-04-10 ENCOUNTER — Encounter: Payer: Self-pay | Admitting: Student in an Organized Health Care Education/Training Program

## 2023-06-16 ENCOUNTER — Other Ambulatory Visit: Payer: Self-pay

## 2023-06-16 DIAGNOSIS — I10 Essential (primary) hypertension: Secondary | ICD-10-CM

## 2023-06-16 MED ORDER — LISINOPRIL 40 MG PO TABS
40.0000 mg | ORAL_TABLET | Freq: Every day | ORAL | 3 refills | Status: AC
Start: 2023-06-16 — End: ?

## 2023-07-07 ENCOUNTER — Encounter: Payer: Medicare HMO | Admitting: Student in an Organized Health Care Education/Training Program

## 2023-08-26 ENCOUNTER — Encounter: Payer: Self-pay | Admitting: *Deleted

## 2023-10-02 ENCOUNTER — Other Ambulatory Visit: Payer: Self-pay

## 2023-10-02 MED ORDER — GABAPENTIN 100 MG PO CAPS: ORAL_CAPSULE | ORAL | 3 refills | Status: DC

## 2023-10-06 ENCOUNTER — Encounter: Payer: Self-pay | Admitting: Student in an Organized Health Care Education/Training Program

## 2023-10-06 ENCOUNTER — Ambulatory Visit (INDEPENDENT_AMBULATORY_CARE_PROVIDER_SITE_OTHER): Payer: Medicare HMO | Admitting: Student in an Organized Health Care Education/Training Program

## 2023-10-06 VITALS — BP 131/55 | HR 76 | Temp 97.7°F | Ht 65.0 in | Wt 150.5 lb

## 2023-10-06 DIAGNOSIS — E785 Hyperlipidemia, unspecified: Secondary | ICD-10-CM

## 2023-10-06 DIAGNOSIS — G3184 Mild cognitive impairment, so stated: Secondary | ICD-10-CM | POA: Diagnosis not present

## 2023-10-06 DIAGNOSIS — G629 Polyneuropathy, unspecified: Secondary | ICD-10-CM

## 2023-10-06 DIAGNOSIS — I10 Essential (primary) hypertension: Secondary | ICD-10-CM

## 2023-10-06 DIAGNOSIS — E7800 Pure hypercholesterolemia, unspecified: Secondary | ICD-10-CM

## 2023-10-06 NOTE — Assessment & Plan Note (Signed)
 Chronic and stable.  Blood pressure is well-controlled today.  Plan to continue amlodipine  10 mg and lisinopril  40 mg daily.  Labs looked fine in August.

## 2023-10-06 NOTE — Progress Notes (Signed)
   Established Patient Office Visit  Subjective   Patient ID: Radiance Devoto, female    DOB: July 02, 1936  Age: 88 y.o. MRN: [id number]  Chief Complaint  Patient presents with   Tremors    HPI  88 year old person here for management of hypertension.  Doing well since I last saw her 6 months ago.  No falls, no ED visits or hospitalizations.  She lives independently with the help from her family.  She is living with visual impairments.  She gets around with the use of a walker and a vision cane.  Good adherence with medications, denies any side effects or concerns.  No lightheadedness on standing.  No chest pain or pressure.  She has no concerns today.  Daughter mentioned a concern about tremors, patient denies any issue there.    Objective:     BP (!) 131/55 (BP Location: Left Arm, Patient Position: Sitting, Cuff Size: Small)   Pulse 76   Temp 97.7 F (36.5 C) (Oral)   Ht 5\' 5"  (1.651 m)   Wt 150 lb 8 oz (68.3 kg)   BMI 25.04 kg/m    Physical Exam  General: Well-appearing, no distress Neck: Normal, no thyromegaly CV: Regular rate, no murmurs Lungs: Clear to auscultation throughout Extremities: Warm and well-perfused with no lower extremity edema Neuro: Alert, conversational, full strength in the upper and lower extremities, delayed get up and go, no resting tremor observed, mild intention tremor     Assessment & Plan:   Problem List Items Addressed This Visit       Medium    Essential hypertension - Primary (Chronic)   Chronic and stable.  Blood pressure is well-controlled today.  Plan to continue amlodipine  10 mg and lisinopril  40 mg daily.  Labs looked fine in August.      Mild cognitive impairment (Chronic)   Chronic and stable.  Patient continues to live independently, has good support from her family.  No safety concerns at this time.      Neuropathy (Chronic)   Chronic and stable.  Currently satisfied with using gabapentin , takes 200 mg in the morning and 400  mg at bedtime.        Low   Hyperlipidemia (Chronic)   Chronic problem.  No recent ischemic events.  Last lipid panel showed LDL a little high at 120.  Tolerating pravastatin  20 mg daily well.  Given her advanced age and frailty, I think it is okay to continue with current dosing of pravastatin  because she is tolerating it so well.  She is somewhat sensitive to side effects from medications.       Return in about 6 months (around 04/04/2024).    Ether Hercules, MD

## 2023-10-06 NOTE — Assessment & Plan Note (Signed)
 Chronic and stable.  Currently satisfied with using gabapentin , takes 200 mg in the morning and 400 mg at bedtime.

## 2023-10-06 NOTE — Assessment & Plan Note (Signed)
 Chronic and stable.  Patient continues to live independently, has good support from her family.  No safety concerns at this time.

## 2023-10-06 NOTE — Assessment & Plan Note (Signed)
 Chronic problem.  No recent ischemic events.  Last lipid panel showed LDL a little high at 120.  Tolerating pravastatin  20 mg daily well.  Given her advanced age and frailty, I think it is okay to continue with current dosing of pravastatin  because she is tolerating it so well.  She is somewhat sensitive to side effects from medications.

## 2023-12-03 ENCOUNTER — Telehealth: Payer: Self-pay | Admitting: Internal Medicine

## 2023-12-03 NOTE — Telephone Encounter (Signed)
 Message has been left for Misty Stanley on the number provided below to please fax any forms to the following Fax Number : [phone]  Copied from CRM [id number]. Topic: General - Other >> Dec 03, 2023 10:24 AM Arley Phenix D wrote: Reason for CRM: Misty Stanley from [facility] is calling to get fax number and verify the patient's PCP. >> Dec 03, 2023 10:30 AM Hector Shade B wrote: Misty Stanley from [facility] called to get the fax number for the office [phone]. She stated she had some paperwork to fax over the the patient.

## 2023-12-05 NOTE — Telephone Encounter (Signed)
 Please see all of the prev messages.  Lisa's VM comes on and she has not called back but the [facility] fax number has been given again today:  Please provider her with this number if she rtns the call again:     12/03/23  1:11 PM Note Message has been left for Misty Stanley on the number provided below to please fax any forms to the following Fax Number : [phone]   Copied from CRM [id number]. Topic: General - Other >> Dec 03, 2023 10:24 AM Arley Phenix D wrote: Reason for CRM: Misty Stanley from [facility] is calling to get fax number and verify the patient's PCP. >> Dec 03, 2023 10:30 AM Hector Shade B wrote: Misty Stanley from [facility] called to get the fax number for the office [phone]. She stated she had some paperwork to fax over the the patient.             Copied from CRM [id number]. Topic: General - Other >> Dec 03, 2023 10:24 AM Arley Phenix D wrote: Reason for CRM: Misty Stanley from [facility] is calling to get fax number and verify the patient's PCP. >> Dec 05, 2023  9:47 AM Dennison Nancy wrote: Demetrio Lapping -[facility] callback [phone] ext [phone] can call if have question  fax over some paperwork  immuno requistion on 12/03/23  the paperwork is time sensitive , if  can get by latest next week   >> Dec 03, 2023 10:30 AM Hector Shade B wrote: Misty Stanley from [facility] called to get the fax number for the office [phone]. She stated she had some paperwork to fax over the the patient.

## 2023-12-11 NOTE — Telephone Encounter (Signed)
 Rtn call the following number listed below a VM comes on with no identifier as to who or what company a message would be left on.  Unable to leave a message without verifying the Caller's info.  Copied from CRM [id number]. Topic: General - Other >> Dec 10, 2023  3:28 PM [name] wrote: Reason for CRM:  [facility] - [name]  Checking on status  fax over paperwork a couple of days ago  4 pages immuno requistion  sent over on 12/03/23 around 1pm  [name] will be refaxing  call back number [phone] ext [phone]

## 2024-01-15 ENCOUNTER — Telehealth: Payer: Self-pay

## 2024-01-15 NOTE — Telephone Encounter (Signed)
 I contacted pt regarding form that I have received from [facility]- genetic test requisition, pt states she don't recalled speaking with them about this. She would like the form to be shred. I advised pt that I will write "pt declined" on the form and will faxed back to [facility].

## 2024-01-26 ENCOUNTER — Ambulatory Visit: Payer: Self-pay

## 2024-01-26 NOTE — Telephone Encounter (Signed)
  Chief Complaint: Difficulty walking even with walker - increasing body shaking. Forgetfulness Symptoms: above Frequency: 2 weeks for walking difficulty Pertinent Negatives: Patient denies  Disposition: [] ED /[] Urgent Care (no appt availability in office) / [x] Appointment(In office/virtual)/ []  [facility]/ [] Home Care/ [] Refused Recommended Disposition /[] [facility]/ []  Follow-up with PCP Additional Notes: Call from pt and her daughter. Pt is having increasing difficulty walking even with walker. She states she is clumsy. She states that this started when her body started shaking. No new medications. Daughter also states that she is having memory issues that are increasing. No appts until Thurs. Scheduled for 3:45 Thurs. Called [facility] - no sooner appts availble. Placed pt on wait list. Daughter will take pt to UC or call back if s/s worsen.    Copied from CRM [id number]. Topic: Clinical - Red Word Triage >> Jan 26, 2024 10:01 AM Retta Caster wrote: Red Word that prompted transfer to Nurse Triage: Hard to walk even with walker Reason for Disposition  Nursing judgment  Protocols used: No Guideline or Reference Available-[facility]

## 2024-01-29 ENCOUNTER — Ambulatory Visit (INDEPENDENT_AMBULATORY_CARE_PROVIDER_SITE_OTHER): Payer: Self-pay | Admitting: Student

## 2024-01-29 ENCOUNTER — Encounter: Payer: Self-pay | Admitting: Student

## 2024-01-29 VITALS — BP 127/68 | HR 80 | Temp 98.1°F | Ht 65.0 in | Wt 140.6 lb

## 2024-01-29 DIAGNOSIS — R251 Tremor, unspecified: Secondary | ICD-10-CM

## 2024-01-29 DIAGNOSIS — G3184 Mild cognitive impairment, so stated: Secondary | ICD-10-CM | POA: Diagnosis not present

## 2024-01-29 DIAGNOSIS — I10 Essential (primary) hypertension: Secondary | ICD-10-CM | POA: Diagnosis not present

## 2024-01-29 DIAGNOSIS — R413 Other amnesia: Secondary | ICD-10-CM

## 2024-01-29 DIAGNOSIS — G253 Myoclonus: Secondary | ICD-10-CM | POA: Insufficient documentation

## 2024-01-29 DIAGNOSIS — R296 Repeated falls: Secondary | ICD-10-CM

## 2024-01-29 HISTORY — DX: Myoclonus: G25.3

## 2024-01-29 NOTE — Progress Notes (Signed)
 CC: involuntary upper body movements  HPI:  [name] is a 88 y.o. female living with a history stated below and presents today for the chief complaint stated above. Please see problem based assessment and plan for additional details.  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 Left eye sees shadows and is bothered by bright lights. She wears specialty sunglasses which help her with sensitivity.    CAD (coronary artery disease)    nonobstructive, myoview normal 03/2007, done by Dr. Danise Durie - EF = 75%   Dementia (HCC)    Glaucoma    HLD (hyperlipidemia)    Hypertension     Current Outpatient Medications on File Prior to Visit  Medication Sig Dispense Refill   amLODipine  (NORVASC ) 10 MG tablet Take 1 tablet (10 mg total) by mouth daily. 90 tablet 3   gabapentin  (NEURONTIN ) 100 MG capsule TAKE 2 CAPSULES BY MOUTH IN THE MORNING, AND 4 CAPSULES AT BEDTIME 180 capsule 3   lisinopril  (ZESTRIL ) 40 MG tablet Take 1 tablet (40 mg total) by mouth daily. 90 tablet 3   nitroGLYCERIN  (NITROSTAT ) 0.4 MG SL tablet NEW PRESCRIPTION REQUEST: DISSOLVE 1 TABLET UNDER THE TONGUE EVERY 5 MINUTES AS NEEDED FOR CHEST PAIN. DO NOT EXCEED A TOTAL OF 3 DOSES IN 15 MINUTES. 75 tablet 0   pravastatin  (PRAVACHOL ) 20 MG tablet Take 1 tablet (20 mg total) by mouth daily. 90 tablet 3   No current facility-administered medications on file prior to visit.    Family History  Problem Relation Age of Onset   Stroke Neg Hx    Cancer Neg Hx     Social History   Socioeconomic History   Marital status: Widowed    Spouse name: Not on file   Number of children: 2   Years of education: Not on file   Highest education level: 12th grade  Occupational History   Occupation: retired  Tobacco Use   Smoking status: Former    Types:  Cigarettes   Smokeless tobacco: Never  Vaping Use   Vaping status: Never Used  Substance and Sexual Activity   Alcohol use: No   Drug use: No   Sexual activity: Not Currently  Other Topics Concern   Not on file  Social History Narrative   Has a son and a daughter, granddaughter spends a lot of time at her house. Sister and she live together for many years. Sister was living with her mother and Ms. Fairburn was married but the mother passed away and her husband passed away so they moved in together.      Pt is right handed, widowed, and  just recently moved out of her 2 story house into a 1st floor apartment.  She drinks 2 glasses of green tea daily, and is somewhat active, though is limited by her inability to see. Pt previously ran an adult day care center.    Social Drivers of Health   Financial Resource Strain: High Risk (11/29/2022)   Overall Financial Resource Strain (CARDIA)    Difficulty of Paying Living Expenses: Hard  Food Insecurity: Food Insecurity Present (11/29/2022)   Hunger Vital Sign    Worried About Running Out of Food in the Last Year: Often true    Ran Out of Food in the Last Year: Often  true  Transportation Needs: No Transportation Needs (11/29/2022)   PRAPARE - Administrator, Civil Service (Medical): No    Lack of Transportation (Non-Medical): No  Physical Activity: Inactive (11/29/2022)   Exercise Vital Sign    Days of Exercise per Week: 0 days    Minutes of Exercise per Session: 0 min  Stress: No Stress Concern Present (11/29/2022)   [employer] of Occupational Health - Occupational Stress Questionnaire    Feeling of Stress : Not at all  Social Connections: Socially Isolated (11/29/2022)   Social Connection and Isolation Panel [NHANES]    Frequency of Communication with Friends and Family: Three times a week    Frequency of Social Gatherings with Friends and Family: Twice a week    Attends Religious Services: Never    Database administrator or  Organizations: No    Attends Banker Meetings: Never    Marital Status: Widowed  Intimate Partner Violence: Not At Risk (11/29/2022)   Humiliation, Afraid, Rape, and Kick questionnaire    Fear of Current or Ex-Partner: No    Emotionally Abused: No    Physically Abused: No    Sexually Abused: No    Review of Systems: ROS negative except for what is noted on the assessment and plan.  Vitals:   01/29/24 1539  BP: 127/68  Pulse: 80  Temp: 98.1 F (36.7 C)  TempSrc: Oral  SpO2: 93%  Weight: 140 lb 9.6 oz (63.8 kg)  Height: 5\' 5"  (1.651 m)   Physical Exam: Well-appearing older woman, sitting in chair, daughter is bedside, no distress Alert and oriented x 3, no sensory deficits; strength 5/5 in her upper and lower extremities; no tremor evident with intention or rest; involuntary upper body and extremity spasmic movements occurring every few minutes or so; no cranial nerve deficits appreciated; finger-to-nose is normal; she walks like she is drunk and has to use the wall to brace herself  Assessment & Plan:   Patient discussed with Dr. Ancil Balzarine  Essential hypertension Blood pressure is well-controlled today, stable.  Will continue amlodipine  10 mg and lisinopril  40 mg daily.  Will recheck a BMP today.  Mild cognitive impairment She continues to have memory problems, which seem to be worsening.  She has insight into her current condition.  Her daughter is present and says that sometimes she forgets where she is, including one time asking for her daughter to take her home, while she was in her own apartment.  She is still able to function with family support enough to take care of herself for now.    We will check a B12, TSH, and CMP today for reversible causes of memory decline. She had a recent non-reactive RPR, so no need to recheck. However, I suspect this may be a progression of a chronic decline due to dementia.   Tremor She presents today with intermittent, involuntary  bilateral upper extremity spasmic like motions, which have been going on for 6 months to a year or so and have been worsening recently.  They now occur intermittently throughout the day every day and interrupt with her daily functioning.  She is not able to tell when they are going to occur.  She tends to keep her arms crossed in order to better control them, however the spasmic motion is still evident with her arms crossed.  I observed several of these during her appointment today.  She also has gait changes concerning for possible cerebellar dysfunction. She does  not have any of these involuntary movements in her lower extremities and her strength was intact on neuro exam.     She says she has been falling with increased frequency at home and is not sure why, but just feels like she has less balance than she used to. She uses a walker at home to ambulate, which helps with her balance; in clinic today, she had to brace herself with the wall to prevent from falling over. I placed a referral to physical therapy, as I feel she could benefit from balance training to reduce falls.   I am not sure what the source of the spasmic movements, but feel she would benefit from further evaluation from neurology, so I have placed a referral.  It is possible that this a progression of dementia.   Dorthy Gavia, MD [facility] Internal Medicine, PGY-1 Phone: [phone] Date 01/29/2024 Time 4:58 PM

## 2024-01-29 NOTE — Assessment & Plan Note (Addendum)
 She presents today with intermittent, involuntary bilateral upper extremity spasmic like motions, which have been going on for 6 months to a year or so and have been worsening recently.  They now occur intermittently throughout the day every day and interrupt with her daily functioning.  She is not able to tell when they are going to occur.  She tends to keep her arms crossed in order to better control them, however the spasmic motion is still evident with her arms crossed.  I observed several of these during her appointment today.  She also has gait changes concerning for possible cerebellar dysfunction. She does not have any of these involuntary movements in her lower extremities and her strength was intact on neuro exam.     She says she has been falling with increased frequency at home and is not sure why, but just feels like she has less balance than she used to. She uses a walker at home to ambulate, which helps with her balance; in clinic today, she had to brace herself with the wall to prevent from falling over. I placed a referral to physical therapy, as I feel she could benefit from balance training to reduce falls.   I am not sure what the source of the spasmic movements, but feel she would benefit from further evaluation from neurology, so I have placed a referral.  It is possible that this a progression of dementia.

## 2024-01-29 NOTE — Assessment & Plan Note (Signed)
 Blood pressure is well-controlled today, stable.  Will continue amlodipine  10 mg and lisinopril  40 mg daily.  Will recheck a BMP today.

## 2024-01-29 NOTE — Patient Instructions (Signed)
 Thank you, [name] for allowing us  to provide your care today. Today we discussed your spasmic movements, memory loss, and falls.  I have placed a referral to the neurologist, who should call you to schedule an appointment to help figure out why you are having these spasmic movements.  For your falls, I have placed a referral to physical therapy, will call you to schedule an appointment.  I think this will help you maintain your balance and prevent injuring yourself.  I have ordered the following labs for you:  Lab Orders         CMP14 + Anion Gap         TSH         Vitamin B12      I will call you as soon as I have the results.   Referrals ordered today:   Referral Orders         Ambulatory referral to Neurology         Ambulatory referral to Physical Therapy      Follow up: 3 months   We look forward to seeing you next time. Please call our clinic at [phone] if you have any questions or concerns. The best time to call is Monday-Friday from 9am-4pm, but there is someone available 24/7. If after hours or the weekend, call the main hospital number and ask for the Internal Medicine Resident On-Call. If you need medication refills, please notify your pharmacy one week in advance and they will send us  a request.   Thank you for trusting me with your care. Wishing you the best!   Dorthy Gavia, MD [facility]

## 2024-01-29 NOTE — Assessment & Plan Note (Addendum)
 She continues to have memory problems, which seem to be worsening.  She has insight into her current condition.  Her daughter is present and says that sometimes she forgets where she is, including one time asking for her daughter to take her home, while she was in her own apartment.  She is still able to function with family support enough to take care of herself for now.    We will check a B12, TSH, and CMP today for reversible causes of memory decline. She had a recent non-reactive RPR, so no need to recheck. However, I suspect this may be a progression of a chronic decline due to dementia.

## 2024-01-30 LAB — CMP14 + ANION GAP
ALT: 9 IU/L (ref 0–32)
AST: 13 IU/L (ref 0–40)
Albumin: 4.2 g/dL (ref 3.7–4.7)
Alkaline Phosphatase: 147 IU/L — ABNORMAL HIGH (ref 44–121)
Anion Gap: 17 mmol/L (ref 10.0–18.0)
BUN/Creatinine Ratio: 10 — ABNORMAL LOW (ref 12–28)
BUN: 11 mg/dL (ref 8–27)
Bilirubin Total: 0.3 mg/dL (ref 0.0–1.2)
CO2: 21 mmol/L (ref 20–29)
Calcium: 9.4 mg/dL (ref 8.7–10.3)
Chloride: 106 mmol/L (ref 96–106)
Creatinine, Ser: 1.09 mg/dL — ABNORMAL HIGH (ref 0.57–1.00)
Globulin, Total: 2.5 g/dL (ref 1.5–4.5)
Glucose: 85 mg/dL (ref 70–99)
Potassium: 3.6 mmol/L (ref 3.5–5.2)
Sodium: 144 mmol/L (ref 134–144)
Total Protein: 6.7 g/dL (ref 6.0–8.5)
eGFR: 49 mL/min/{1.73_m2} — ABNORMAL LOW (ref >59)

## 2024-01-30 LAB — VITAMIN B12: Vitamin B-12: 358 pg/mL (ref 232–1245)

## 2024-01-30 LAB — TSH: TSH: 1.58 u[IU]/mL (ref 0.450–4.500)

## 2024-01-30 NOTE — Progress Notes (Signed)
 Internal Medicine Clinic Attending  Case discussed with the resident at the time of the visit.  We reviewed the resident's history and exam and pertinent patient test results.  I agree with the assessment, diagnosis, and plan of care documented in the resident's note.

## 2024-01-30 NOTE — Addendum Note (Signed)
 Addended by: Bevelyn Bryant on: 01/30/2024 09:45 AM   Modules accepted: Level of Service

## 2024-02-10 ENCOUNTER — Ambulatory Visit: Payer: Self-pay | Admitting: Student

## 2024-02-11 ENCOUNTER — Encounter

## 2024-03-09 ENCOUNTER — Telehealth: Payer: Self-pay

## 2024-03-09 NOTE — Telephone Encounter (Signed)
 Thank you. It looks like this is an order for a back brace. Can we please fax this form over to the [facility], attn: Dr. Trudy who is the patient's current PCP?

## 2024-03-09 NOTE — Telephone Encounter (Signed)
 Forms from [employer] were faxed over to our clinic to be signed and progress notes to be sent in. Dr.Vincent has not seen patient since 10/06/2023. Patient was last seen on 01/31/2024 and 02/10/2024 by Dr. Gregary. I have placed the forms on your desk.

## 2024-03-12 NOTE — Telephone Encounter (Signed)
 Copied from CRM [id number]. Topic: Clinical - Lab/Test Results >> Mar 12, 2024 10:24 AM Miquel SAILOR wrote: Reason for CRM: Ray from [facility]  will be faxing over Labs results for the PCP to review and fax back. Needs call back on update [phone]

## 2024-03-15 ENCOUNTER — Other Ambulatory Visit: Payer: Self-pay

## 2024-03-15 ENCOUNTER — Ambulatory Visit: Attending: Internal Medicine

## 2024-03-15 ENCOUNTER — Telehealth: Payer: Self-pay | Admitting: *Deleted

## 2024-03-15 DIAGNOSIS — R296 Repeated falls: Secondary | ICD-10-CM | POA: Diagnosis not present

## 2024-03-15 DIAGNOSIS — R262 Difficulty in walking, not elsewhere classified: Secondary | ICD-10-CM | POA: Insufficient documentation

## 2024-03-15 NOTE — Telephone Encounter (Signed)
 Already spoke with patient on another open encounter.

## 2024-03-15 NOTE — Telephone Encounter (Signed)
 RTC to [facility] spoke with Simon.  Faxing over order to do Home based stool sample.  Patient  had spoken to her insurance company about having some GI problems. Spoke to patient's daughter and patient who said that patient is not having any problems with her stomach. Will discuss with Dr. Trudy at next visit if she is having any problems.

## 2024-03-15 NOTE — Telephone Encounter (Signed)
 I contacted pt regarding a form I received from [facility], pt states she did not know anything about this form. Requesting [facility] staff to sent back stating pt refused.

## 2024-03-15 NOTE — Therapy (Signed)
 OUTPATIENT PHYSICAL THERAPY LOWER EXTREMITY EVALUATION   Patient Name: [name] MRN: [id number] DOB:Aug 10, 1936, 88 y.o., female Today's Date: 03/15/2024  END OF SESSION:  PT End of Session - 03/15/24 1615     Visit Number 1    Date for PT Re-Evaluation 06/07/24    PT Start Time 1615    PT Stop Time 1645    PT Time Calculation (min) 30 min    Activity Tolerance Patient tolerated treatment well    Behavior During Therapy [facility] for tasks assessed/performed          Past Medical History:  Diagnosis Date   Arthritis    Blindness of right eye with low vision in contralateral eye 06/27/2011   Patient right eye is enucleated due to end stage glaucoma and she has a prosthesis in place. Left eye sees shadows and is bothered by bright lights. She wears specialty sunglasses which help her with sensitivity.    CAD (coronary artery disease)    nonobstructive, myoview normal 03/2007, done by Dr. Edith - EF = 75%   Dementia (HCC)    Glaucoma    HLD (hyperlipidemia)    Hypertension    Past Surgical History:  Procedure Laterality Date   CARDIAC CATHETERIZATION     CHOLECYSTECTOMY N/A 05/04/2020   Procedure: LAPAROSCOPIC CHOLECYSTECTOMY WITH INTRAOPERATIVE CHOLANGIOGRAM;  Surgeon: Curvin Deward MOULD, MD;  Location: [facility] OR;  Service: General;  Laterality: N/A;   DILATION AND CURETTAGE OF UTERUS     ERCP N/A 05/05/2020   Procedure: ENDOSCOPIC RETROGRADE CHOLANGIOPANCREATOGRAPHY (ERCP);  Surgeon: Rollin Dover, MD;  Location: [facility] ENDOSCOPY;  Service: Endoscopy;  Laterality: N/A;   EYE SURGERY     OCULAR PROSTHESIS REMOVAL     Left eye    REMOVAL OF STONES  05/05/2020   Procedure: REMOVAL OF STONES;  Surgeon: Rollin Dover, MD;  Location: [facility] ENDOSCOPY;  Service: Endoscopy;;   SPHINCTEROTOMY  05/05/2020   Procedure: ANNETT;  Surgeon: Rollin Dover, MD;  Location: [facility] ENDOSCOPY;  Service: Endoscopy;;   TONSILLECTOMY     Patient Active Problem List   Diagnosis Date Noted   Tremor 01/29/2024    Onychomycosis 04/07/2023   Neuropathy 03/25/2022   Hyperlipidemia 05/25/2019   Cervical osteoarthritis 02/17/2017   Mild cognitive impairment 08/05/2016   Stress incontinence 06/20/2016   Calcification of aorta (HCC) 04/15/2016   At high risk for falls 04/15/2016   Health care maintenance 09/21/2014   Blindness of right eye with low vision in contralateral eye 06/27/2011   Glaucoma 04/22/2007   Essential hypertension 04/22/2007    PCP: [name], MD  REFERRING PROVIDER: Karna Fellows MD  REFERRING DIAG: h/ o falls  THERAPY DIAG:  Difficulty in walking, not elsewhere classified  Rationale for Evaluation and Treatment: Rehabilitation  ONSET DATE: 01/29/24  SUBJECTIVE:   SUBJECTIVE STATEMENT: Pt asked 5 times why am I here today.  And stated several times her balance is good.   PERTINENT HISTORY: Referred by PCP after office visit due to pt falling a lot  PAIN:  Are you having pain? no  PRECAUTIONS: Fall  RED FLAGS: Cognitive, asked 5 x during evaluation why am I here?   WEIGHT BEARING RESTRICTIONS: No  FALLS:  Has patient fallen in last 6 months? Yes. Number of falls 3  LIVING ENVIRONMENT: Lives with: lives with their family Lives in: House/apartment Stairs: Yes: External: flight steps; on right going up Has following equipment at home: blind cane  OCCUPATION: retired  PLOF: Independent  PATIENT GOALS: not  fall  NEXT MD VISIT: unknown  OBJECTIVE:  Note: Objective measures were completed at Evaluation unless otherwise noted.  DIAGNOSTIC FINDINGS: na   COGNITION: Overall cognitive status: Impaired     SENSATION: WFL  EDEMA:  None noted  POSTURE: standing posture with marked R trunk lean  PALPATION: na  LOWER EXTREMITY ROM: all wfl   LOWER EXTREMITY MMT: all mmt grossly wnl except B ankles 4-/5  MMT Right eval Left eval  Hip flexion    Hip extension    Hip abduction    Hip adduction    Hip internal rotation    Hip  external rotation    Knee flexion    Knee extension    Ankle dorsiflexion    Ankle plantarflexion    Ankle inversion    Ankle eversion     (Blank rows = not tested)  LOWER EXTREMITY SPECIAL TESTS:  na  FUNCTIONAL TESTS:  30 seconds chair stand test  8  Berg 31/54  GAIT: Distance walked: in clinic 7' Assistive device utilized: blind cane and HHA Level of assistance:  Comments: no significant deviations, vision limits walking speed                                                                                                                                TREATMENT DATE: 03/15/24: evaluation, pt 15 min late    PATIENT EDUCATION:  Education details: POC Person educated: pt, daughter, son in Social worker Education method: Explanation, Demonstration, Actor cues, and Verbal cues Education comprehension: verbalized understanding, returned demonstration, verbal cues required, and tactile cues required  HOME EXERCISE PROGRAM: TBD  ASSESSMENT:  CLINICAL IMPRESSION: Patient is a 88 y.o. female who was evaluated today by physical therapy  for balance deficits frequent falls.  She was attended by her daughter and son and law.  Has had several falls at her home. Her history was inconsistent, per her family the pts granddaughter assists the pt with bathing, dressing, meals.  Pt stated that she performs these activities I.  The pt is a fall risk with Berg score of 31/54. Will benefit from repetitive balance training and strength training B ankles.   OBJECTIVE IMPAIRMENTS: decreased balance, decreased cognition, decreased knowledge of condition, decreased strength, decreased safety awareness, and impaired vision/preception.   ACTIVITY LIMITATIONS: carrying, standing, stairs, transfers, and locomotion level  PARTICIPATION LIMITATIONS: cleaning, laundry, shopping, and community activity  PERSONAL FACTORS: Age, Time since onset of injury/illness/exacerbation, Transportation, and 1-2  comorbidities: dementia, low vision, blind R eye, low vision L eye are also affecting patient's functional outcome.   REHAB POTENTIAL: Fair due to cognition  CLINICAL DECISION MAKING: Evolving/moderate complexity  EVALUATION COMPLEXITY: Moderate   GOALS: Goals reviewed with patient? Yes  SHORT TERM GOALS: Target date: 2 weeks 03/29/24  I HEP Baseline: Goal status: INITIAL  LONG TERM GOALS: Target date: 12 weeks 06/07/24  Berg increase from 31/54 to 45 /54 for decreased fall risk Baseline:  Goal status: INITIAL  2.  Ankle strength improve from 4-5/ to 4+/5 for better stability Baseline:  Goal status: INITIAL  3.  30 sec sit to stand improve from 8 to 10 Baseline:  Goal status: INITIAL    PLAN:  PT FREQUENCY: 2x/week  PT DURATION: 12 weeks  PLANNED INTERVENTIONS: 97110-Therapeutic exercises, 97530- Therapeutic activity, 97112- Neuromuscular re-education, 97535- Self Care, and 02859- Manual therapy  PLAN FOR NEXT SESSION: balance activities, LE and ankle strength   Frieda Arnall L Keymarion Bearman, PT, DPT, OCS 03/15/2024, 5:04 PM

## 2024-03-15 NOTE — Telephone Encounter (Signed)
 RTC no answer.  Message left that the Clinics had called.  No lab request have been received. Copied from CRM [id number]. Topic: General - Other >> Mar 12, 2024  1:36 PM [name] wrote: Reason for CRM: [facility] called to see if lab req form was received. [phone]. They need this form back ASAP

## 2024-03-17 NOTE — Telephone Encounter (Signed)
 Attn: Ullery Kenneth FALCON, RN    Simon with [facility]  Ask that if the patient is refusing can you fax the form back  with the note stating that the  Patient Refused  Fax no# [phone]

## 2024-03-18 NOTE — Telephone Encounter (Signed)
 Form has been faxed to [facility], with a note stating Patient Refused. Confirmation went through.

## 2024-03-29 ENCOUNTER — Encounter: Admitting: Physical Therapy

## 2024-04-01 ENCOUNTER — Ambulatory Visit: Attending: Internal Medicine | Admitting: Physical Therapy

## 2024-04-01 ENCOUNTER — Encounter: Payer: Self-pay | Admitting: Physical Therapy

## 2024-04-01 DIAGNOSIS — R262 Difficulty in walking, not elsewhere classified: Secondary | ICD-10-CM | POA: Insufficient documentation

## 2024-04-01 NOTE — Therapy (Signed)
 OUTPATIENT PHYSICAL THERAPY LOWER EXTREMITY TREATMENT   Patient Name: [name] MRN: [id number] DOB:1935-10-08, 88 y.o., female Today's Date: 04/01/2024  END OF SESSION:  PT End of Session - 04/01/24 1609     Visit Number 2    Date for PT Re-Evaluation 06/07/24    PT Start Time 1609    PT Stop Time 1655    PT Time Calculation (min) 46 min    Activity Tolerance Patient tolerated treatment well    Behavior During Therapy [facility] for tasks assessed/performed          Past Medical History:  Diagnosis Date   Arthritis    Blindness of right eye with low vision in contralateral eye 06/27/2011   Patient right eye is enucleated due to end stage glaucoma and she has a prosthesis in place. Left eye sees shadows and is bothered by bright lights. She wears specialty sunglasses which help her with sensitivity.    CAD (coronary artery disease)    nonobstructive, myoview normal 03/2007, done by Dr. Edith - EF = 75%   Dementia (HCC)    Glaucoma    HLD (hyperlipidemia)    Hypertension    Past Surgical History:  Procedure Laterality Date   CARDIAC CATHETERIZATION     CHOLECYSTECTOMY N/A 05/04/2020   Procedure: LAPAROSCOPIC CHOLECYSTECTOMY WITH INTRAOPERATIVE CHOLANGIOGRAM;  Surgeon: Curvin Deward MOULD, MD;  Location: [facility] OR;  Service: General;  Laterality: N/A;   DILATION AND CURETTAGE OF UTERUS     ERCP N/A 05/05/2020   Procedure: ENDOSCOPIC RETROGRADE CHOLANGIOPANCREATOGRAPHY (ERCP);  Surgeon: Rollin Dover, MD;  Location: [facility] ENDOSCOPY;  Service: Endoscopy;  Laterality: N/A;   EYE SURGERY     OCULAR PROSTHESIS REMOVAL     Left eye    REMOVAL OF STONES  05/05/2020   Procedure: REMOVAL OF STONES;  Surgeon: Rollin Dover, MD;  Location: [facility] ENDOSCOPY;  Service: Endoscopy;;   SPHINCTEROTOMY  05/05/2020   Procedure: ANNETT;  Surgeon: Rollin Dover, MD;  Location: [facility] ENDOSCOPY;  Service: Endoscopy;;   TONSILLECTOMY     Patient Active Problem List   Diagnosis Date Noted   Tremor 01/29/2024    Onychomycosis 04/07/2023   Neuropathy 03/25/2022   Hyperlipidemia 05/25/2019   Cervical osteoarthritis 02/17/2017   Mild cognitive impairment 08/05/2016   Stress incontinence 06/20/2016   Calcification of aorta (HCC) 04/15/2016   At high risk for falls 04/15/2016   Health care maintenance 09/21/2014   Blindness of right eye with low vision in contralateral eye 06/27/2011   Glaucoma 04/22/2007   Essential hypertension 04/22/2007    PCP: [name], MD  REFERRING PROVIDER: Karna Fellows MD  REFERRING DIAG: h/ o falls  THERAPY DIAG:  Difficulty in walking, not elsewhere classified  Rationale for Evaluation and Treatment: Rehabilitation  ONSET DATE: 01/29/24  SUBJECTIVE:   SUBJECTIVE STATEMENT: Doing okay, no falls, reports no pain  PERTINENT HISTORY: Referred by PCP after office visit due to pt falling a lot  PAIN:  Are you having pain? no  PRECAUTIONS: Fall  RED FLAGS: Cognitive, asked 5 x during evaluation why am I here?   WEIGHT BEARING RESTRICTIONS: No  FALLS:  Has patient fallen in last 6 months? Yes. Number of falls 3  LIVING ENVIRONMENT: Lives with: lives with their family Lives in: House/apartment Stairs: Yes: External: flight steps; on right going up Has following equipment at home: blind cane  OCCUPATION: retired  PLOF: Independent  PATIENT GOALS: not fall  NEXT MD VISIT: unknown  OBJECTIVE:  Note: Objective measures  were completed at Evaluation unless otherwise noted.  DIAGNOSTIC FINDINGS: na   COGNITION: Overall cognitive status: Impaired     SENSATION: WFL  EDEMA:  None noted  POSTURE: standing posture with marked R trunk lean  PALPATION: na  LOWER EXTREMITY ROM: all wfl   LOWER EXTREMITY MMT: all mmt grossly wnl except B ankles 4-/5  MMT Right eval Left eval  Hip flexion    Hip extension    Hip abduction    Hip adduction    Hip internal rotation    Hip external rotation    Knee flexion    Knee extension     Ankle dorsiflexion    Ankle plantarflexion    Ankle inversion    Ankle eversion     (Blank rows = not tested)  LOWER EXTREMITY SPECIAL TESTS:  na  FUNCTIONAL TESTS:  30 seconds chair stand test  8  Berg 31/54  GAIT: Distance walked: in clinic 16' Assistive device utilized: blind cane and HHA Level of assistance:  Comments: no significant deviations, vision limits walking speed                                                                                                                                TREATMENT DATE:  04/01/24 Bike level 3 x 5 minutes 2.5# LAQ 2x10 2.5# marches 2x10 Ball b/n knees squeeze BP 135/80 Direction changes On airex standing CGA Yellow tband clamshells Seated toe raises Seated heel raises Yellow tband rows in sitting  03/15/24: evaluation, pt 15 min late    PATIENT EDUCATION:  Education details: POC Person educated: pt, daughter, son in Social worker Education method: Explanation, Demonstration, Actor cues, and Verbal cues Education comprehension: verbalized understanding, returned demonstration, verbal cues required, and tactile cues required  HOME EXERCISE PROGRAM: TBD  ASSESSMENT:  CLINICAL IMPRESSION: Patient definitely cuts up and likes to joke.  She had some cramps and soreness in the left leg with exercises.  She needs CGA for any exercises.  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  We started mostly exercises to get some strength then did a little bit of balance  Patient is a 88 y.o. female who was evaluated today by physical therapy  for balance deficits frequent falls.  She was attended by her daughter and son and law.  Has had several falls at her home. Her history was inconsistent, per her family the pts granddaughter assists the pt with bathing, dressing, meals.  Pt stated that she performs these activities I.  The pt is a fall risk with Berg score of 31/54. Will benefit from repetitive balance  training and strength training B ankles.   OBJECTIVE IMPAIRMENTS: decreased balance, decreased cognition, decreased knowledge of condition, decreased strength, decreased safety awareness, and impaired vision/preception.   ACTIVITY LIMITATIONS: carrying, standing, stairs, transfers, and locomotion level  PARTICIPATION LIMITATIONS: cleaning, laundry, shopping, and community activity  PERSONAL  FACTORS: Age, Time since onset of injury/illness/exacerbation, Transportation, and 1-2 comorbidities: dementia, low vision, blind R eye, low vision L eye are also affecting patient's functional outcome.   REHAB POTENTIAL: Fair due to cognition  CLINICAL DECISION MAKING: Evolving/moderate complexity  EVALUATION COMPLEXITY: Moderate   GOALS: Goals reviewed with patient? Yes  SHORT TERM GOALS: Target date: 2 weeks 03/29/24  I HEP Baseline: Goal status: ongoing 04/01/24  LONG TERM GOALS: Target date: 12 weeks 06/07/24  Berg increase from 31/54 to 45 /54 for decreased fall risk Baseline:  Goal status: INITIAL  2.  Ankle strength improve from 4-5/ to 4+/5 for better stability Baseline:  Goal status: INITIAL  3.  30 sec sit to stand improve from 8 to 10 Baseline:  Goal status: INITIAL    PLAN:  PT FREQUENCY: 2x/week  PT DURATION: 12 weeks  PLANNED INTERVENTIONS: 97110-Therapeutic exercises, 97530- Therapeutic activity, [id number]- Neuromuscular re-education, 97535- Self Care, and 02859- Manual therapy  PLAN FOR NEXT SESSION: balance activities, LE and ankle strength   Braden Cimo W, PT,  04/01/2024, 4:09 PM

## 2024-04-05 ENCOUNTER — Encounter: Payer: Self-pay | Admitting: Physical Therapy

## 2024-04-05 ENCOUNTER — Ambulatory Visit: Admitting: Physical Therapy

## 2024-04-05 DIAGNOSIS — R262 Difficulty in walking, not elsewhere classified: Secondary | ICD-10-CM

## 2024-04-05 NOTE — Therapy (Signed)
 OUTPATIENT PHYSICAL THERAPY LOWER EXTREMITY TREATMENT   Patient Name: [name] MRN: [id number] DOB:[birth year], 88 y.o., female Today's Date: 04/05/2024  END OF SESSION:  PT End of Session - 04/05/24 1609     Visit Number 3    Number of Visits 24    Date for PT Re-Evaluation 06/07/24    Authorization Type [employer] 3/24    PT Start Time 1609    PT Stop Time 1655    PT Time Calculation (min) 46 min    Activity Tolerance Patient tolerated treatment well    Behavior During Therapy [facility] for tasks assessed/performed          Past Medical History:  Diagnosis Date   Arthritis    Blindness of right eye with low vision in contralateral eye 06/27/2011   Patient right eye is enucleated due to end stage glaucoma and she has a prosthesis in place. Left eye sees shadows and is bothered by bright lights. She wears specialty sunglasses which help her with sensitivity.    CAD (coronary artery disease)    nonobstructive, myoview normal 03/2007, done by Dr. Edith - EF = 75%   Dementia (HCC)    Glaucoma    HLD (hyperlipidemia)    Hypertension    Past Surgical History:  Procedure Laterality Date   CARDIAC CATHETERIZATION     CHOLECYSTECTOMY N/A 05/04/2020   Procedure: LAPAROSCOPIC CHOLECYSTECTOMY WITH INTRAOPERATIVE CHOLANGIOGRAM;  Surgeon: Curvin Deward MOULD, MD;  Location: [facility] OR;  Service: General;  Laterality: N/A;   DILATION AND CURETTAGE OF UTERUS     ERCP N/A 05/05/2020   Procedure: ENDOSCOPIC RETROGRADE CHOLANGIOPANCREATOGRAPHY (ERCP);  Surgeon: Rollin Dover, MD;  Location: [facility] ENDOSCOPY;  Service: Endoscopy;  Laterality: N/A;   EYE SURGERY     OCULAR PROSTHESIS REMOVAL     Left eye    REMOVAL OF STONES  05/05/2020   Procedure: REMOVAL OF STONES;  Surgeon: Rollin Dover, MD;  Location: [facility] ENDOSCOPY;  Service: Endoscopy;;   SPHINCTEROTOMY  05/05/2020   Procedure: ANNETT;  Surgeon: Rollin Dover, MD;  Location: [facility] ENDOSCOPY;  Service: Endoscopy;;   TONSILLECTOMY     Patient Active  Problem List   Diagnosis Date Noted   Tremor 01/29/2024   Onychomycosis 04/07/2023   Neuropathy 03/25/2022   Hyperlipidemia 05/25/2019   Cervical osteoarthritis 02/17/2017   Mild cognitive impairment 08/05/2016   Stress incontinence 06/20/2016   Calcification of aorta (HCC) 04/15/2016   At high risk for falls 04/15/2016   Health care maintenance 09/21/2014   Blindness of right eye with low vision in contralateral eye 06/27/2011   Glaucoma 04/22/2007   Essential hypertension 04/22/2007    PCP: [name], MD  REFERRING PROVIDER: Karna Fellows MD  REFERRING DIAG: h/ o falls  THERAPY DIAG:  Difficulty in walking, not elsewhere classified  Rationale for Evaluation and Treatment: Rehabilitation  ONSET DATE: 01/29/24  SUBJECTIVE:   SUBJECTIVE STATEMENT: Doing okay, no falls, reports no pain  PERTINENT HISTORY: Referred by PCP after office visit due to pt falling a lot  PAIN:  Are you having pain? no  PRECAUTIONS: Fall  RED FLAGS: Cognitive, asked 5 x during evaluation why am I here?   WEIGHT BEARING RESTRICTIONS: No  FALLS:  Has patient fallen in last 6 months? Yes. Number of falls 3  LIVING ENVIRONMENT: Lives with: lives with their family Lives in: House/apartment Stairs: Yes: External: flight steps; on right going up Has following equipment at home: blind cane  OCCUPATION: retired  PLOF: Independent  PATIENT  GOALS: not fall  NEXT MD VISIT: unknown  OBJECTIVE:  Note: Objective measures were completed at Evaluation unless otherwise noted.  DIAGNOSTIC FINDINGS: na   COGNITION: Overall cognitive status: Impaired     SENSATION: WFL  EDEMA:  None noted  POSTURE: standing posture with marked R trunk lean  PALPATION: na  LOWER EXTREMITY ROM: all wfl   LOWER EXTREMITY MMT: all mmt grossly wnl except B ankles 4-/5  MMT Right eval Left eval  Hip flexion    Hip extension    Hip abduction    Hip adduction    Hip internal rotation     Hip external rotation    Knee flexion    Knee extension    Ankle dorsiflexion    Ankle plantarflexion    Ankle inversion    Ankle eversion     (Blank rows = not tested)  LOWER EXTREMITY SPECIAL TESTS:  na  FUNCTIONAL TESTS:  30 seconds chair stand test  8  Berg 31/54  GAIT: Distance walked: in clinic 46' Assistive device utilized: blind cane and HHA Level of assistance:  Comments: no significant deviations, vision limits walking speed                                                                                                                                TREATMENT DATE:  04/05/24 Nustep level 5 x 6 minutes 2.5# marches 2.5# LAQ 2.5# hip abduction with walker 15# HS curls Direction changes Fast walking with HHA SPC 6 toe touches Ball toss and bounce On airex balance beam side stepping  04/01/24 Bike level 3 x 5 minutes 2.5# LAQ 2x10 2.5# marches 2x10 Ball b/n knees squeeze BP 135/80 Direction changes On airex standing CGA Yellow tband clamshells Seated toe raises Seated heel raises Yellow tband rows in sitting  03/15/24: evaluation, pt 15 min late    PATIENT EDUCATION:  Education details: POC Person educated: pt, daughter, son in Social worker Education method: Explanation, Demonstration, Actor cues, and Verbal cues Education comprehension: verbalized understanding, returned demonstration, verbal cues required, and tactile cues required  HOME EXERCISE PROGRAM: TBD  ASSESSMENT:  CLINICAL IMPRESSION: Patient definitely cuts up and likes to joke.  She seems to have some dizziness when she stands up.  She needs CGA due to vision and balance issues.    Patient is a 88 y.o. female who was evaluated today by physical therapy  for balance deficits frequent falls.  She was attended by her daughter and son and law.  Has had several falls at her home. Her history was inconsistent, per her family the pts granddaughter assists the pt with bathing, dressing, meals.  Pt  stated that she performs these activities I.  The pt is a fall risk with Berg score of 31/54. Will benefit from repetitive balance training and strength training B ankles.   OBJECTIVE IMPAIRMENTS: decreased balance, decreased cognition, decreased knowledge of condition, decreased strength, decreased safety awareness, and impaired  vision/preception.   ACTIVITY LIMITATIONS: carrying, standing, stairs, transfers, and locomotion level  PARTICIPATION LIMITATIONS: cleaning, laundry, shopping, and community activity  PERSONAL FACTORS: Age, Time since onset of injury/illness/exacerbation, Transportation, and 1-2 comorbidities: dementia, low vision, blind R eye, low vision L eye are also affecting patient's functional outcome.   REHAB POTENTIAL: Fair due to cognition  CLINICAL DECISION MAKING: Evolving/moderate complexity  EVALUATION COMPLEXITY: Moderate   GOALS: Goals reviewed with patient? Yes  SHORT TERM GOALS: Target date: 2 weeks 03/29/24  I HEP Baseline: Goal status: ongoing 04/01/24  LONG TERM GOALS: Target date: 12 weeks 06/07/24  Berg increase from 31/54 to 45 /54 for decreased fall risk Baseline:  Goal status: INITIAL  2.  Ankle strength improve from 4-5/ to 4+/5 for better stability Baseline:  Goal status: INITIAL  3.  30 sec sit to stand improve from 8 to 10 Baseline:  Goal status: INITIAL    PLAN:  PT FREQUENCY: 2x/week  PT DURATION: 12 weeks  PLANNED INTERVENTIONS: 97110-Therapeutic exercises, 97530- Therapeutic activity, [id number]- Neuromuscular re-education, 97535- Self Care, and 02859- Manual therapy  PLAN FOR NEXT SESSION: balance activities, LE and ankle strength   Merik Mignano W, PT,  04/05/2024, 4:09 PM

## 2024-04-07 ENCOUNTER — Ambulatory Visit: Admitting: Physical Therapy

## 2024-04-07 ENCOUNTER — Encounter: Payer: Self-pay | Admitting: Physical Therapy

## 2024-04-07 DIAGNOSIS — R262 Difficulty in walking, not elsewhere classified: Secondary | ICD-10-CM | POA: Diagnosis not present

## 2024-04-07 NOTE — Therapy (Signed)
 OUTPATIENT PHYSICAL THERAPY LOWER EXTREMITY TREATMENT   Patient Name: [name] MRN: [id number] DOB:[birth year], 88 y.o., female Today's Date: 04/07/2024  END OF SESSION:  PT End of Session - 04/07/24 1609     Visit Number 4    Number of Visits 24    Date for PT Re-Evaluation 06/07/24    Authorization Type [employer] 4/24    PT Start Time 1609    PT Stop Time 1656    PT Time Calculation (min) 47 min    Activity Tolerance Patient tolerated treatment well    Behavior During Therapy [facility] [address] for tasks assessed/performed          Past Medical History:  Diagnosis Date   Arthritis    Blindness of right eye with low vision in contralateral eye 06/27/2011   Patient right eye is enucleated due to end stage glaucoma and she has a prosthesis in place. Left eye sees shadows and is bothered by bright lights. She wears specialty sunglasses which help her with sensitivity.    CAD (coronary artery disease)    nonobstructive, myoview normal 03/2007, done by Dr. Edith - EF = 75%   Dementia (HCC)    Glaucoma    HLD (hyperlipidemia)    Hypertension    Past Surgical History:  Procedure Laterality Date   CARDIAC CATHETERIZATION     CHOLECYSTECTOMY N/A 05/04/2020   Procedure: LAPAROSCOPIC CHOLECYSTECTOMY WITH INTRAOPERATIVE CHOLANGIOGRAM;  Surgeon: Curvin Deward MOULD, MD;  Location: [facility] OR;  Service: General;  Laterality: N/A;   DILATION AND CURETTAGE OF UTERUS     ERCP N/A 05/05/2020   Procedure: ENDOSCOPIC RETROGRADE CHOLANGIOPANCREATOGRAPHY (ERCP);  Surgeon: Rollin Dover, MD;  Location: [facility] ENDOSCOPY;  Service: Endoscopy;  Laterality: N/A;   EYE SURGERY     OCULAR PROSTHESIS REMOVAL     Left eye    REMOVAL OF STONES  05/05/2020   Procedure: REMOVAL OF STONES;  Surgeon: Rollin Dover, MD;  Location: [facility] ENDOSCOPY;  Service: Endoscopy;;   SPHINCTEROTOMY  05/05/2020   Procedure: ANNETT;  Surgeon: Rollin Dover, MD;  Location: [facility] ENDOSCOPY;  Service: Endoscopy;;   TONSILLECTOMY     Patient Active  Problem List   Diagnosis Date Noted   Tremor 01/29/2024   Onychomycosis 04/07/2023   Neuropathy 03/25/2022   Hyperlipidemia 05/25/2019   Cervical osteoarthritis 02/17/2017   Mild cognitive impairment 08/05/2016   Stress incontinence 06/20/2016   Calcification of aorta (HCC) 04/15/2016   At high risk for falls 04/15/2016   Health care maintenance 09/21/2014   Blindness of right eye with low vision in contralateral eye 06/27/2011   Glaucoma 04/22/2007   Essential hypertension 04/22/2007    PCP: [name], MD  REFERRING PROVIDER: Karna Fellows MD  REFERRING DIAG: h/ o falls  THERAPY DIAG:  Difficulty in walking, not elsewhere classified  Rationale for Evaluation and Treatment: Rehabilitation  ONSET DATE: 01/29/24  SUBJECTIVE:   SUBJECTIVE STATEMENT: Doing well, I get a little tired after treatment  PERTINENT HISTORY: Referred by PCP after office visit due to pt falling a lot  PAIN:  Are you having pain? no  PRECAUTIONS: Fall  RED FLAGS: Cognitive, asked 5 x during evaluation why am I here?   WEIGHT BEARING RESTRICTIONS: No  FALLS:  Has patient fallen in last 6 months? Yes. Number of falls 3  LIVING ENVIRONMENT: Lives with: lives with their family Lives in: House/apartment Stairs: Yes: External: flight steps; on right going up Has following equipment at home: blind cane  OCCUPATION: retired  PLOF: Independent  PATIENT GOALS: not fall  NEXT MD VISIT: unknown  OBJECTIVE:  Note: Objective measures were completed at Evaluation unless otherwise noted.  DIAGNOSTIC FINDINGS: na   COGNITION: Overall cognitive status: Impaired     SENSATION: WFL  EDEMA:  None noted  POSTURE: standing posture with marked R trunk lean  PALPATION: na  LOWER EXTREMITY ROM: all wfl   LOWER EXTREMITY MMT: all mmt grossly wnl except B ankles 4-/5  MMT Right eval Left eval  Hip flexion    Hip extension    Hip abduction    Hip adduction    Hip internal  rotation    Hip external rotation    Knee flexion    Knee extension    Ankle dorsiflexion    Ankle plantarflexion    Ankle inversion    Ankle eversion     (Blank rows = not tested)  LOWER EXTREMITY SPECIAL TESTS:  na  FUNCTIONAL TESTS:  30 seconds chair stand test  8  Berg 31/54  GAIT: Distance walked: in clinic 39' Assistive device utilized: blind cane and HHA Level of assistance:  Comments: no significant deviations, vision limits walking speed                                                                                                                                TREATMENT DATE:  04/07/24 Nustep level 5 x 6 minutes Gait outside with holding PT elbow 1/2 way around the parking island Leg curls 15# 2x10 5# leg extension 3x5 2.5# hip abduction with walker 2x10 2.5# hip extension with walker 2x10 Supine feet on ball K2C, rotation, bridge, isometric abs  04/05/24 Nustep level 5 x 6 minutes 2.5# marches 2.5# LAQ 2.5# hip abduction with walker 15# HS curls Direction changes Fast walking with HHA SPC 6 toe touches Ball toss and bounce On airex balance beam side stepping  04/01/24 Bike level 3 x 5 minutes 2.5# LAQ 2x10 2.5# marches 2x10 Ball b/n knees squeeze BP 135/80 Direction changes On airex standing CGA Yellow tband clamshells Seated toe raises Seated heel raises Yellow tband rows in sitting  03/15/24: evaluation, pt 15 min late    PATIENT EDUCATION:  Education details: POC Person educated: pt, daughter, son in Social worker Education method: Explanation, Demonstration, Actor cues, and Verbal cues Education comprehension: verbalized understanding, returned demonstration, verbal cues required, and tactile cues required  HOME EXERCISE PROGRAM: TBD  ASSESSMENT:  CLINICAL IMPRESSION: Continued to try to advance her strength, function and safety, we did some walking outside and she really got fatigued.  I did add some machines for strength and again  tolerated well but did report some mm fatigue  Patient is a 88 y.o. female who was evaluated today by physical therapy  for balance deficits frequent falls.  She was attended by her daughter and son and law.  Has had several falls at her home. Her history was inconsistent, per her family the pts granddaughter assists  the pt with bathing, dressing, meals.  Pt stated that she performs these activities I.  The pt is a fall risk with Berg score of 31/54. Will benefit from repetitive balance training and strength training B ankles.   OBJECTIVE IMPAIRMENTS: decreased balance, decreased cognition, decreased knowledge of condition, decreased strength, decreased safety awareness, and impaired vision/preception.   ACTIVITY LIMITATIONS: carrying, standing, stairs, transfers, and locomotion level  PARTICIPATION LIMITATIONS: cleaning, laundry, shopping, and community activity  PERSONAL FACTORS: Age, Time since onset of injury/illness/exacerbation, Transportation, and 1-2 comorbidities: dementia, low vision, blind R eye, low vision L eye are also affecting patient's functional outcome.   REHAB POTENTIAL: Fair due to cognition  CLINICAL DECISION MAKING: Evolving/moderate complexity  EVALUATION COMPLEXITY: Moderate   GOALS: Goals reviewed with patient? Yes  SHORT TERM GOALS: Target date: 2 weeks 03/29/24  I HEP Baseline: Goal status: met 04/07/24  LONG TERM GOALS: Target date: 12 weeks 06/07/24  Berg increase from 31/54 to 45 /54 for decreased fall risk Baseline:  Goal status: INITIAL  2.  Ankle strength improve from 4-5/ to 4+/5 for better stability Baseline:  Goal status: INITIAL  3.  30 sec sit to stand improve from 8 to 10 Baseline:  Goal status: INITIAL    PLAN:  PT FREQUENCY: 2x/week  PT DURATION: 12 weeks  PLANNED INTERVENTIONS: 97110-Therapeutic exercises, 97530- Therapeutic activity, [id number]- Neuromuscular re-education, 97535- Self Care, and 02859- Manual therapy  PLAN FOR NEXT  SESSION: balance activities, LE and ankle strength   Klaira Pesci W, PT,  04/07/2024, 4:10 PM

## 2024-04-12 ENCOUNTER — Ambulatory Visit: Admitting: Physical Therapy

## 2024-04-12 ENCOUNTER — Encounter: Payer: Self-pay | Admitting: Physical Therapy

## 2024-04-12 DIAGNOSIS — R262 Difficulty in walking, not elsewhere classified: Secondary | ICD-10-CM

## 2024-04-12 NOTE — Therapy (Signed)
 OUTPATIENT PHYSICAL THERAPY LOWER EXTREMITY TREATMENT   Patient Name: [name] MRN: [id number] DOB:17-Dec-1935, 88 y.o., female Today's Date: 04/12/2024  END OF SESSION:  PT End of Session - 04/12/24 1609     Visit Number 5    Number of Visits 24    Date for PT Re-Evaluation 06/07/24    Authorization Type [employer] 5/24    PT Start Time 1610    PT Stop Time 1655    PT Time Calculation (min) 45 min    Activity Tolerance Patient tolerated treatment well    Behavior During Therapy [facility] for tasks assessed/performed          Past Medical History:  Diagnosis Date   Arthritis    Blindness of right eye with low vision in contralateral eye 06/27/2011   Patient right eye is enucleated due to end stage glaucoma and she has a prosthesis in place. Left eye sees shadows and is bothered by bright lights. She wears specialty sunglasses which help her with sensitivity.    CAD (coronary artery disease)    nonobstructive, myoview normal 03/2007, done by Dr. Edith - EF = 75%   Dementia (HCC)    Glaucoma    HLD (hyperlipidemia)    Hypertension    Past Surgical History:  Procedure Laterality Date   CARDIAC CATHETERIZATION     CHOLECYSTECTOMY N/A 05/04/2020   Procedure: LAPAROSCOPIC CHOLECYSTECTOMY WITH INTRAOPERATIVE CHOLANGIOGRAM;  Surgeon: Curvin Deward MOULD, MD;  Location: [facility] OR;  Service: General;  Laterality: N/A;   DILATION AND CURETTAGE OF UTERUS     ERCP N/A 05/05/2020   Procedure: ENDOSCOPIC RETROGRADE CHOLANGIOPANCREATOGRAPHY (ERCP);  Surgeon: Rollin Dover, MD;  Location: [facility] ENDOSCOPY;  Service: Endoscopy;  Laterality: N/A;   EYE SURGERY     OCULAR PROSTHESIS REMOVAL     Left eye    REMOVAL OF STONES  05/05/2020   Procedure: REMOVAL OF STONES;  Surgeon: Rollin Dover, MD;  Location: [facility] ENDOSCOPY;  Service: Endoscopy;;   SPHINCTEROTOMY  05/05/2020   Procedure: ANNETT;  Surgeon: Rollin Dover, MD;  Location: [facility] ENDOSCOPY;  Service: Endoscopy;;   TONSILLECTOMY     Patient Active  Problem List   Diagnosis Date Noted   Tremor 01/29/2024   Onychomycosis 04/07/2023   Neuropathy 03/25/2022   Hyperlipidemia 05/25/2019   Cervical osteoarthritis 02/17/2017   Mild cognitive impairment 08/05/2016   Stress incontinence 06/20/2016   Calcification of aorta (HCC) 04/15/2016   At high risk for falls 04/15/2016   Health care maintenance 09/21/2014   Blindness of right eye with low vision in contralateral eye 06/27/2011   Glaucoma 04/22/2007   Essential hypertension 04/22/2007    PCP: [name], MD  REFERRING PROVIDER: Karna Fellows MD  REFERRING DIAG: h/ o falls  THERAPY DIAG:  Difficulty in walking, not elsewhere classified  Rationale for Evaluation and Treatment: Rehabilitation  ONSET DATE: 01/29/24  SUBJECTIVE:   SUBJECTIVE STATEMENT: Doing good, no problems  PERTINENT HISTORY: Referred by PCP after office visit due to pt falling a lot  PAIN:  Are you having pain? no  PRECAUTIONS: Fall  RED FLAGS: Cognitive, asked 5 x during evaluation why am I here?   WEIGHT BEARING RESTRICTIONS: No  FALLS:  Has patient fallen in last 6 months? Yes. Number of falls 3  LIVING ENVIRONMENT: Lives with: lives with their family Lives in: House/apartment Stairs: Yes: External: flight steps; on right going up Has following equipment at home: blind cane  OCCUPATION: retired  PLOF: Independent  PATIENT GOALS: not fall  NEXT MD VISIT: unknown  OBJECTIVE:  Note: Objective measures were completed at Evaluation unless otherwise noted.  DIAGNOSTIC FINDINGS: na   COGNITION: Overall cognitive status: Impaired     SENSATION: WFL  EDEMA:  None noted  POSTURE: standing posture with marked R trunk lean  PALPATION: na  LOWER EXTREMITY ROM: all wfl   LOWER EXTREMITY MMT: all mmt grossly wnl except B ankles 4-/5  MMT Right eval Left eval  Hip flexion    Hip extension    Hip abduction    Hip adduction    Hip internal rotation    Hip external  rotation    Knee flexion    Knee extension    Ankle dorsiflexion    Ankle plantarflexion    Ankle inversion    Ankle eversion     (Blank rows = not tested)  LOWER EXTREMITY SPECIAL TESTS:  na  FUNCTIONAL TESTS:  30 seconds chair stand test  8  Berg 31/54  GAIT: Distance walked: in clinic 24' Assistive device utilized: blind cane and HHA Level of assistance:  Comments: no significant deviations, vision limits walking speed                                                                                                                                TREATMENT DATE:  04/12/24 Nustep level 5 x 6 minutes Gait outside around the parking [employer] Direction changes HHA 2.5# marches 2.5# hip abduction Leg curls 15# Leg extension 5# Ball toss 8 toe touches Ball in lap isometrics  04/07/24 Nustep level 5 x 6 minutes Gait outside with holding PT elbow 1/2 way around the parking island Leg curls 15# 2x10 5# leg extension 3x5 2.5# hip abduction with walker 2x10 2.5# hip extension with walker 2x10 Supine feet on ball K2C, rotation, bridge, isometric abs  04/05/24 Nustep level 5 x 6 minutes 2.5# marches 2.5# LAQ 2.5# hip abduction with walker 15# HS curls Direction changes Fast walking with HHA SPC 6 toe touches Ball toss and bounce On airex balance beam side stepping  04/01/24 Bike level 3 x 5 minutes 2.5# LAQ 2x10 2.5# marches 2x10 Ball b/n knees squeeze BP 135/80 Direction changes On airex standing CGA Yellow tband clamshells Seated toe raises Seated heel raises Yellow tband rows in sitting  03/15/24: evaluation, pt 15 min late    PATIENT EDUCATION:  Education details: POC Person educated: pt, daughter, son in Social worker Education method: Explanation, Demonstration, Actor cues, and Verbal cues Education comprehension: verbalized understanding, returned demonstration, verbal cues required, and tactile cues required  HOME EXERCISE  PROGRAM: TBD  ASSESSMENT:  CLINICAL IMPRESSION: Trying to continue to work on strength, endurance and balance.  Did a little more walking today she needed a rest break coming up the hill, she was very tired at the end and reported legs felt shaky.  Needs CGA due to poor vision.  Patient is a 88 y.o. female who was evaluated today  by physical therapy  for balance deficits frequent falls.  She was attended by her daughter and son and law.  Has had several falls at her home. Her history was inconsistent, per her family the pts granddaughter assists the pt with bathing, dressing, meals.  Pt stated that she performs these activities I.  The pt is a fall risk with Berg score of 31/54. Will benefit from repetitive balance training and strength training B ankles.   OBJECTIVE IMPAIRMENTS: decreased balance, decreased cognition, decreased knowledge of condition, decreased strength, decreased safety awareness, and impaired vision/preception.   ACTIVITY LIMITATIONS: carrying, standing, stairs, transfers, and locomotion level  PARTICIPATION LIMITATIONS: cleaning, laundry, shopping, and community activity  PERSONAL FACTORS: Age, Time since onset of injury/illness/exacerbation, Transportation, and 1-2 comorbidities: dementia, low vision, blind R eye, low vision L eye are also affecting patient's functional outcome.   REHAB POTENTIAL: Fair due to cognition  CLINICAL DECISION MAKING: Evolving/moderate complexity  EVALUATION COMPLEXITY: Moderate   GOALS: Goals reviewed with patient? Yes  SHORT TERM GOALS: Target date: 2 weeks 03/29/24  I HEP Baseline: Goal status: met 04/07/24  LONG TERM GOALS: Target date: 12 weeks 06/07/24  Berg increase from 31/54 to 45 /54 for decreased fall risk Baseline:  Goal status: INITIAL  2.  Ankle strength improve from 4-5/ to 4+/5 for better stability Baseline:  Goal status: INITIAL  3.  30 sec sit to stand improve from 8 to 10 Baseline:  Goal status:  INITIAL    PLAN:  PT FREQUENCY: 2x/week  PT DURATION: 12 weeks  PLANNED INTERVENTIONS: 97110-Therapeutic exercises, 97530- Therapeutic activity, [id number]- Neuromuscular re-education, 97535- Self Care, and 02859- Manual therapy  PLAN FOR NEXT SESSION: balance activities, LE and ankle strength   Eryx Zane W, PT,  04/12/2024, 4:10 PM

## 2024-04-13 ENCOUNTER — Ambulatory Visit: Admitting: Internal Medicine

## 2024-04-13 ENCOUNTER — Encounter: Payer: Self-pay | Admitting: Internal Medicine

## 2024-04-13 VITALS — BP 125/62 | HR 77 | Temp 98.3°F | Ht 65.0 in | Wt 134.4 lb

## 2024-04-13 DIAGNOSIS — R296 Repeated falls: Secondary | ICD-10-CM

## 2024-04-13 DIAGNOSIS — H5452A1 Low vision left eye category 1, normal vision right eye: Secondary | ICD-10-CM | POA: Insufficient documentation

## 2024-04-13 DIAGNOSIS — G253 Myoclonus: Secondary | ICD-10-CM

## 2024-04-13 DIAGNOSIS — G301 Alzheimer's disease with late onset: Secondary | ICD-10-CM

## 2024-04-13 DIAGNOSIS — I10 Essential (primary) hypertension: Secondary | ICD-10-CM | POA: Diagnosis not present

## 2024-04-13 DIAGNOSIS — H5410 Blindness, one eye, low vision other eye, unspecified eyes: Secondary | ICD-10-CM | POA: Diagnosis not present

## 2024-04-13 DIAGNOSIS — G629 Polyneuropathy, unspecified: Secondary | ICD-10-CM | POA: Diagnosis not present

## 2024-04-13 DIAGNOSIS — F0283 Dementia in other diseases classified elsewhere, unspecified severity, with mood disturbance: Secondary | ICD-10-CM

## 2024-04-13 DIAGNOSIS — Z9001 Acquired absence of eye: Secondary | ICD-10-CM | POA: Diagnosis not present

## 2024-04-13 DIAGNOSIS — R270 Ataxia, unspecified: Secondary | ICD-10-CM

## 2024-04-13 DIAGNOSIS — F02818 Dementia in other diseases classified elsewhere, unspecified severity, with other behavioral disturbance: Secondary | ICD-10-CM

## 2024-04-13 MED ORDER — DONEPEZIL HCL 5 MG PO TABS
5.0000 mg | ORAL_TABLET | Freq: Every day | ORAL | 2 refills | Status: DC
Start: 2024-04-13 — End: 2024-07-05

## 2024-04-13 MED ORDER — CITALOPRAM HYDROBROMIDE 10 MG PO TABS: 10.0000 mg | ORAL_TABLET | Freq: Every day | ORAL | 11 refills | Status: DC

## 2024-04-13 NOTE — Patient Instructions (Signed)
 Ms. Lippy,  I'm so glad that we had an opportunity to meet today, and to meet your daughter Rojelio.  We discussed many things.  We will evaluate your balance problems and hand tingling with a neck spine MRI, which will determine whether there is pressure or irritation on your neck nerves which could explain your problems.  I'd like for you to see an eye doctor to determine if there are ways to improve the vision in your left eye.  We are starting two new medicines today - one for irritability (citalopram ), and one to help prevent your memory from worsening further (donepezil ).  Let's get together in about 1 month to see how you're doing.  I'll call with results of your MRI.  Take care and stay safe,  Dr. Trudy    The 36 Hour Day is a great book :)

## 2024-04-13 NOTE — Assessment & Plan Note (Signed)
 Formal screening assessment not done (low yield due to vision impairment and degree of cognitive impairment).  She is not oriented - She didn't know where she was today and stated that the person she was with was her sister (was her daughter). She often requests to go home (will even pack her bags) even though she is home.  She is unable to recall much history at all, but does recognize that she doesn't like how she feels and recognizes that her cognition has declined. Functionally she would be considered to be at a FAST score of around 5 of 7.  Given family history and the slow insidious onset initially of memory changes, , and in absence of reversible causes and alternatives such as strokes, synucleinopathies (PD, LBD) or other causes, her diagnosis is by far most likely to be Alzheimer's disease.  I shared this with [name] and [name].  Plan will be to initiate donepezil  at 5 mg nightly, and to treat her irritability and depressed mood with citalopram  at 10 mg daily.  She and her daughter are in agreement.  The book The 36 Hour Day was recommended.  Safety is of the essence; she has assistance with medication management and meals and is supervised for most hours of the day.  Door camera and wearable fall alert device are helpful.  I will continue to follow her for the time being as PCP and will see them again in about a month for recheck.  Concerns about gait and myoclonus are addressed in separate problem.

## 2024-04-13 NOTE — Progress Notes (Unsigned)
 Comprehensive Geriatric Assessment - Initial Consultation  PCP: recently Dr. Mliss Tapia; being reassigned to me (Dr. Mliss Pouch) Referring Provider: Dr. Ozell [name] Reason for Consultation: cognitive impairment evaluation and diagnosis; gait impairment; motor symptoms  88 yo [name], a former patient of faculty physicians Dr. Jerrell and briefly by Dr. Foot, has been seen in our [facility] recently with the concerns identified above.  Additional geriatrics evaluation was recommended and she is here today with her daughter, [name], to better explore her symptoms and to seek diagnoses and management recommendations.   [name] was last seen by Dr. Jerrell in 09/2023, at which time she was noted to be doing well, carrying dx of stable MCI, and 6 mo f/u was recommended.  Further back in 2023 her daughter was reporting decline in functional aspects of living and she was started on donepezil  with concern that she did have early dementia.  She was evaluated in 01/2024 by Dr. Riff for c/o involuntary upper body movements (jerks) and ataxic gait.  Home PT was ordered, and she is currently receiving sessions (though she doesn't always remember why).  She herself can't recall much about her involuntary movements, and given the degree of her cognitive impairment is not able to provide much history outside of the present moment.  Daughter isn't sure how long these movements have been occurring, but there is note of some type of tremor reported at the 09/2023 visit.  She has been falling, but doesn't wish to use her walker and depends on wall/furniture stabilization or use of her vision cane.    Hand tingling; review of chart shows that she reported this in 2023 and was prescribed gabapentin .  She was diagnosed with non-specific neuropathy.  She is very uncomfortable with this.    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 06/27/2011   Glaucoma 04/22/2007   Essential hypertension 04/22/2007    Current Outpatient Medications:    amLODipine  (NORVASC ) 10 MG tablet, Take 1 tablet (10 mg total) by mouth daily., Disp: 90 tablet, Rfl: 3   gabapentin  (NEURONTIN ) 100 MG capsule, TAKE 2 CAPSULES BY MOUTH IN THE MORNING, AND 4 CAPSULES AT BEDTIME, Disp: 180 capsule, Rfl: 3   lisinopril  (ZESTRIL ) 40 MG tablet, Take 1 tablet (40 mg total) by mouth daily., Disp: 90 tablet, Rfl: 3   nitroGLYCERIN  (NITROSTAT ) 0.4 MG SL tablet, NEW PRESCRIPTION REQUEST: DISSOLVE 1 TABLET UNDER THE TONGUE EVERY 5 MINUTES AS NEEDED FOR CHEST PAIN. DO NOT EXCEED A TOTAL OF 3 DOSES IN 15 MINUTES., Disp: 75 tablet, Rfl: 0   pravastatin  (PRAVACHOL ) 20 MG tablet, Take 1 tablet (20 mg total) by mouth daily., Disp: 90 tablet, Rfl: 3  Family history: [name] is the youngest of her siblings.  Positive family hx of dementia: mother; a living sister and brother; and 3 deceased sibs may have also had.  Social: [name] is widowed.  She lives with her granddaughter (who works).  Her daughter, [name], arrives at the home at 7 am and prepares her breakfast and lunch, and is with her until around 4p.  Granddaughter arrives home  between 4 and 9 pm, depending on her work shift.  Granddaughter helps her get situated in bed.  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  She doesn't consume alcohol, doesn't smoke, and doesn't use marijuana or other illicit substances.    ADLs/IADLs: Needs more help recently with walking and transferring due to her gait instability.  Can manage bowel, bladder and continence.   Reported to independently bath (she is private and doesn't want assistance) - has both a tub and a shower I can do it myself.  Assisted with clothing selection but dresses herself.  Feeds herself, but meals are prepared for her.  She is dependent in all IADLs.    Has a life alert.  Has a cemera at the door.  Opens it sometimes. Has eloped.   Geriatric ROS Appetite/Weight: Giood appetite and reports stable weight, no digestion problems.   Elimination: No bowel problems; NO UI, but does have urge.   Mobility & Falls: Ataxic gait; undergoing PT. Uses vision cane, should be using a walker but resists.  Has had multiple non-injurious falls.   Sensory: Has good conversational hearing.  Wearing dark glasses; doesn't want anyone to see her complete R ptosis; R eye is sightless.  L eye has foggy vision.  Glaucoma runs in the family. Has been a long time since she saw an eye specialist.  Pain: No chronic pain, but reports sometimes a weight or tightness in her head, she can't recall specifics.   Sleep: She reports good sleeping. Mood: I'm angry, frustrated, tired, I'm not where I should be, this is not me - she has never said this to anyone before. Daughter [name] agrees that her mother has been short with her and irritable, angry at times, which she hasn't understood it's not like her to be that way.   Physical Exam BP 125/62 (BP Location: Right Arm, Patient Position: Sitting)   Pulse 77   Temp 98.3 F (36.8 C) (Oral)   Ht 5' 5 (1.651 m)   Wt 134 lb 6.4 oz (61 kg)   SpO2 100%   BMI 22.37 kg/m  Attractively dressed and groomed well-appearing woman, sitting on exam table wearing very dark glasses. Face notable for complete R ptosis and absence of R eye.  L eye appears normal though has cloudiness perhaps suggestive of cataract.  There is no abnormal movement with directional gaze.  No tongue fasciculations.   Heart RRR, no murmur.  Lungs clear throughout.  No carotid bruits, no JVD.  No  palpable thyroid  abnormality.  Hands with red and warm (nml) palms, no finger/hand deformities or joint pain, complete grips.  Sensation seems to be normal as far as she can tell.  Negative Spurlings left and right.  Legs symmetric motor bulk and normal tone, no LE edema, 1+ dp pulses.  Wearing supportive footwear.   Neuro:  No tremor, no fasciculations. DTRs-bilateral biceps 2+, R patellar hyperreflexic, L patellar is 2+, achilles are bilateral 1+, no dysdiadokinesis and no dysmetria.  No tremor.  Good ROM at shoulders, elbows, hands, hips, knees and ankles.   Hand grip strength is symmetric and 5/5, as is all major muscle groups of arms and legs.  Standing up and holding position elicits sporadic jerks of  myoclonus, which seems to affect her arms, legs and trunk without focality.  Steps are ataxic; she cannot walk without assistance.  Standing balance worsened with eyes closed; I could not safely release her. Able to  stand up on toes, and back on heels.  Standing on each leg independently was difficult and caused myoclonic jerks.    Formal screening assessment not done (low yield due to vision impairment and degree of cognitive impairment).  She is not oriented - She didn't know where she was today and stated that the person she was with was her sister (was her daughter). She often requests to go home (will even pack her bags) even though she is home.  She is unable to recall much history at all, but does recognize that she doesn't like how she feels and recognizes that her cognition has declined. Functionally she would be considered to be at a FAST score of around 5 of 7.  Last thyroid  functions Lab Results  Component Value Date   TSH 1.580 01/29/2024   Last vitamin D No results found for: 25OHVITD2, 25OHVITD3, VD25OH Last vitamin B12 and Folate Lab Results  Component Value Date   VITAMINB12 358 01/29/2024      Assessment and Plan There are no diagnoses linked to this encounter.    No follow-ups on file.

## 2024-04-14 ENCOUNTER — Ambulatory Visit: Admitting: Physical Therapy

## 2024-04-14 ENCOUNTER — Encounter: Payer: Self-pay | Admitting: Physical Therapy

## 2024-04-14 DIAGNOSIS — R262 Difficulty in walking, not elsewhere classified: Secondary | ICD-10-CM

## 2024-04-14 NOTE — Assessment & Plan Note (Signed)
 Given clinical context, high suspicious that her myoclonus (precipitated by changes in position, posture, walking) may be due to her cervical spine process.  No culprit medications and no renal or hepatic cause.

## 2024-04-14 NOTE — Assessment & Plan Note (Signed)
 Well controlled 125/62 on stable regimen of amlodipine  10 mg daily, lisinopril  40 mg daily.  To reduce pill burden and monitoring will consider combos; not addressed today.

## 2024-04-14 NOTE — Therapy (Signed)
 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 Left eye sees shadows and is bothered by bright lights. She wears specialty sunglasses which help her with sensitivity.    CAD (coronary artery disease)    nonobstructive, myoview normal 03/2007, done by Dr. Edith - EF = 75%   Dementia [facility])    Dementia of the Alzheimer's type, with late onset, with depressed mood (HCC) 08/05/2016   Glaucoma    H/O enucleation of right eyeball 06/27/2011   Patient right eye is enucleated due to end stage glaucoma and she has a prosthesis in place. Left eye sees shadows and is bothered by bright lights. She wears specialty sunglasses which help her with sensitivity.     HLD (hyperlipidemia)    Hypertension    Recurrent falls 04/15/2016   Past Surgical History:  Procedure Laterality Date   CARDIAC CATHETERIZATION     CHOLECYSTECTOMY N/A 05/04/2020   Procedure: LAPAROSCOPIC CHOLECYSTECTOMY WITH INTRAOPERATIVE CHOLANGIOGRAM;  Surgeon: Curvin Deward MOULD, MD;  Location: [facility] OR;  Service: General;  Laterality: N/A;   DILATION AND CURETTAGE OF UTERUS     ERCP N/A 05/05/2020   Procedure: ENDOSCOPIC RETROGRADE CHOLANGIOPANCREATOGRAPHY (ERCP);  Surgeon: Rollin Dover, MD;  Location: [facility]  ENDOSCOPY;  Service: Endoscopy;  Laterality: N/A;   EYE SURGERY     OCULAR PROSTHESIS REMOVAL     Left eye    REMOVAL OF STONES  05/05/2020   Procedure: REMOVAL OF STONES;  Surgeon: Rollin Dover, MD;  Location: [facility] ENDOSCOPY;  Service: Endoscopy;;   SPHINCTEROTOMY  05/05/2020   Procedure: ANNETT;  Surgeon: Rollin Dover, MD;  Location: [facility] ENDOSCOPY;  Service: Endoscopy;;   TONSILLECTOMY     Patient Active Problem List   Diagnosis Date Noted   Low vision of left eye 04/13/2024   Ataxia 04/13/2024   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   Neuropathy 03/25/2022   Hyperlipidemia 05/25/2019   Cervical osteoarthritis 02/17/2017   Dementia of the Alzheimer's type, with late onset, with depressed mood (HCC) 08/05/2016   Stress incontinence 06/20/2016   Calcification of aorta (HCC) 04/15/2016   Recurrent falls 04/15/2016   Health care maintenance 09/21/2014   H/O enucleation of right eyeball; glaucoma 06/27/2011   Essential hypertension 04/22/2007    PCP: [name], MD  REFERRING PROVIDER: Karna Fellows MD  REFERRING DIAG: h/ o falls  THERAPY DIAG:  Difficulty in walking, not elsewhere classified  Rationale for Evaluation and Treatment: Rehabilitation  ONSET DATE: 01/29/24  SUBJECTIVE:   SUBJECTIVE STATEMENT: No falls, just tired, some dizziness  PERTINENT HISTORY: Referred by PCP after office visit due to pt falling a lot  PAIN:  Are you having pain? no  PRECAUTIONS: Fall  RED FLAGS: Cognitive, asked 5 x during evaluation why am I here?   WEIGHT BEARING RESTRICTIONS: No  FALLS:  Has patient fallen in last 6 months? Yes. Number of falls 3  LIVING ENVIRONMENT: Lives with: lives with their family Lives in: House/apartment Stairs: Yes: External: flight steps; on right going up Has following equipment at home: blind cane  OCCUPATION: retired  PLOF: Independent  PATIENT GOALS: not fall  NEXT MD VISIT: unknown  OBJECTIVE:  Note: Objective measures  were completed at Evaluation unless otherwise noted.  DIAGNOSTIC FINDINGS: na   COGNITION: Overall cognitive status: Impaired     SENSATION: WFL  EDEMA:  None noted  POSTURE: standing posture with marked R trunk lean  PALPATION: na  LOWER EXTREMITY ROM: all wfl   LOWER EXTREMITY MMT: all mmt grossly wnl except B ankles 4-/5  MMT Right eval Left eval  Hip flexion    Hip extension    Hip abduction    Hip adduction    Hip internal rotation    Hip external rotation    Knee flexion    Knee extension    Ankle dorsiflexion    Ankle plantarflexion    Ankle inversion    Ankle eversion     (Blank rows = not tested)  LOWER EXTREMITY SPECIAL TESTS:  na  FUNCTIONAL TESTS:  30 seconds chair stand test  8  Berg 31/54  GAIT: Distance walked: in clinic 66' Assistive device utilized: blind cane and HHA Level of assistance:  Comments: no significant deviations, vision limits walking speed                                                                                                                                TREATMENT DATE:  04/14/24 Nustep Level 5 x 6 minutes Gait outside the parking [place], went out front door and around building in grass to the parking [place], needed a sitting rest break 20# HS curls 2x10 5# Knee extension 2 x10 LEg press 20#  04/12/24 Nustep level 5 x 6 minutes Gait outside around the parking [employer] Direction changes HHA 2.5# marches 2.5# hip abduction Leg curls 15# Leg extension 5# Ball toss 8 toe touches Ball in lap isometrics  04/07/24 Nustep level 5 x 6 minutes Gait outside with holding PT elbow 1/2 way around the parking island Leg curls 15# 2x10 5# leg extension 3x5 2.5# hip abduction with walker 2x10 2.5# hip extension with walker 2x10 Supine feet on ball K2C, rotation, bridge, isometric abs  04/05/24 Nustep level 5 x 6 minutes 2.5# marches 2.5# LAQ 2.5# hip abduction with walker 15# HS curls Direction changes Fast  walking with HHA SPC 6 toe touches Ball toss and bounce On airex balance beam side stepping  04/01/24 Bike level 3 x 5 minutes 2.5# LAQ 2x10 2.5# marches 2x10 Ball b/n knees squeeze BP 135/80 Direction changes On airex  standing CGA Yellow tband clamshells Seated toe raises Seated heel raises Yellow tband rows in sitting  03/15/24: evaluation, pt 15 min late    PATIENT EDUCATION:  Education details: POC Person educated: pt, daughter, son in Social worker Education method: Explanation, Demonstration, Tactile cues, and Verbal cues Education comprehension: verbalized understanding, returned demonstration, verbal cues required, and tactile cues required  HOME EXERCISE PROGRAM: TBD  ASSESSMENT:  CLINICAL IMPRESSION: Trying to continue to work on strength, endurance and balance.  Walked out in the grass and uneven slopes also added leg press, she was very unsteady after this as she was very tired.  Patient is a 87 y.o. female who was evaluated today by physical therapy  for balance deficits frequent falls.  She was attended by her daughter and son and law.  Has had several falls at her home. Her history was inconsistent, per her family the pts granddaughter assists the pt with bathing, dressing, meals.  Pt stated that she performs these activities I.  The pt is a fall risk with Berg score of 31/54. Will benefit from repetitive balance training and strength training B ankles.   OBJECTIVE IMPAIRMENTS: decreased balance, decreased cognition, decreased knowledge of condition, decreased strength, decreased safety awareness, and impaired vision/preception.   ACTIVITY LIMITATIONS: carrying, standing, stairs, transfers, and locomotion level  PARTICIPATION LIMITATIONS: cleaning, laundry, shopping, and community activity  PERSONAL FACTORS: Age, Time since onset of injury/illness/exacerbation, Transportation, and 1-2 comorbidities: dementia, low vision, blind R eye, low vision L eye are also affecting  patient's functional outcome.   REHAB POTENTIAL: Fair due to cognition  CLINICAL DECISION MAKING: Evolving/moderate complexity  EVALUATION COMPLEXITY: Moderate   GOALS: Goals reviewed with patient? Yes  SHORT TERM GOALS: Target date: 2 weeks 03/29/24  I HEP Baseline: Goal status: met 04/07/24  LONG TERM GOALS: Target date: 12 weeks 06/07/24  Berg increase from 31/54 to 45 /54 for decreased fall risk Baseline:  Goal status: INITIAL  2.  Ankle strength improve from 4-5/ to 4+/5 for better stability Baseline:  Goal status: ongoing 04/14/24  3.  30 sec sit to stand improve from 8 to 10 Baseline:  Goal status: INITIAL    PLAN:  PT FREQUENCY: 2x/week  PT DURATION: 12 weeks  PLANNED INTERVENTIONS: 97110-Therapeutic exercises, 97530- Therapeutic activity, 97112- Neuromuscular re-education, 97535- Self Care, and 02859- Manual therapy  PLAN FOR NEXT SESSION: balance activities, LE and ankle strength   Yashika Mask W, PT,  04/14/2024, 4:13 PM

## 2024-04-14 NOTE — Assessment & Plan Note (Signed)
 Chart review reveals that this is a clinical diagnosis based on her reports of BUE tingling in 2018-19, at which time she was evaluated with cervical spine MRI which did show some degenerative changes.  Gabapentin  was initiated.  She does not have LE sensory changes.

## 2024-04-14 NOTE — Assessment & Plan Note (Addendum)
 Fall frequency has increased this year and gait is ataxic. This, together with DTR asymmetry, BUE tingling dysesthesias (though without apparent loss of strength on exam), together with absence of cerebellar signs on neuro testing, are suggestive of cervical myelopathy or radiculopathy.  MRI cervical spine ordered.  She is reminded to use her walker but doesn't remember why, and is resistant to suggestions.  Monitor closely.  High risk of injurious fall.  She is undergoing PT to work on balance and strengthening. Medications are currently not felt to be contributors to her particular fall risk.

## 2024-04-14 NOTE — Assessment & Plan Note (Signed)
 Hx of glaucoma, but a cloudy lens and hazy vision are suspicious for cataract. It has been years since an ophthalmology appt; will refer, for attempts to optimize vision to reduce fall risk.

## 2024-04-15 ENCOUNTER — Other Ambulatory Visit: Payer: Self-pay | Admitting: Internal Medicine

## 2024-04-15 MED ORDER — GABAPENTIN 100 MG PO CAPS: ORAL_CAPSULE | ORAL | 2 refills | Status: DC

## 2024-04-15 NOTE — Telephone Encounter (Signed)
 Copied from CRM #[id number]. Topic: Clinical - Medication Refill >> Apr 15, 2024 11:11 AM Suzette B wrote: Medication: gabapentin  (NEURONTIN ) 100 MG capsule  Has the patient contacted their pharmacy? Yes Patient was told to call the providers office to request more refills   This is the patient's preferred pharmacy:  [id number] [address], [place] - [address] AT [facility] OF [facility] & [address] [place] [address] Phone: [phone] Fax: [phone]  Is this the correct pharmacy for this prescription? Yes If no, delete pharmacy and type the correct one.   Has the prescription been filled recently? Yes  Is the patient out of the medication? No 3 pills   Has the patient been seen for an appointment in the last year OR does the patient have an upcoming appointment? Yes  Can we respond through MyChart? No  Agent: Please be advised that Rx refills may take up to 3 business days. We ask that you follow-up with your pharmacy.

## 2024-04-19 ENCOUNTER — Encounter: Admitting: Physical Therapy

## 2024-04-21 ENCOUNTER — Encounter: Payer: Self-pay | Admitting: Physical Therapy

## 2024-04-21 ENCOUNTER — Ambulatory Visit: Admitting: Physical Therapy

## 2024-04-21 DIAGNOSIS — R262 Difficulty in walking, not elsewhere classified: Secondary | ICD-10-CM | POA: Diagnosis not present

## 2024-04-21 NOTE — Therapy (Signed)
 OUTPATIENT PHYSICAL THERAPY LOWER EXTREMITY TREATMENT   Patient Name: [name] MRN: [id number] DOB:[birth year], 88 y.o., female Today's Date: 04/21/2024  END OF SESSION:  PT End of Session - 04/21/24 1612     Visit Number 7    Number of Visits 24    Date for PT Re-Evaluation 06/07/24    Authorization Type [employer] 7/24    PT Start Time 1612    PT Stop Time 1657    PT Time Calculation (min) 45 min    Activity Tolerance Patient tolerated treatment well    Behavior During Therapy [facility] for tasks assessed/performed          Past Medical History:  Diagnosis Date   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   Arthritis    Blindness of right eye with low vision in contralateral eye 06/27/2011   Patient right eye is enucleated due to end stage glaucoma and she has a prosthesis in place. Left eye sees shadows and is bothered by bright lights. She wears specialty sunglasses which help her with sensitivity.    CAD (coronary artery disease)    nonobstructive, myoview normal 03/2007, done by Dr. Edith - EF = 75%   Dementia [facility])    Dementia of the Alzheimer's type, with late onset, with depressed mood (HCC) 08/05/2016   Glaucoma    H/O enucleation of right eyeball 06/27/2011   Patient right eye is enucleated due to end stage glaucoma and she has a prosthesis in place. Left eye sees shadows and is bothered by bright lights. She wears specialty sunglasses which help her with sensitivity.     HLD (hyperlipidemia)    Hypertension    Recurrent falls 04/15/2016   Past Surgical History:  Procedure Laterality Date   CARDIAC CATHETERIZATION     CHOLECYSTECTOMY N/A 05/04/2020   Procedure: LAPAROSCOPIC CHOLECYSTECTOMY WITH INTRAOPERATIVE CHOLANGIOGRAM;  Surgeon: Curvin Deward MOULD, MD;  Location: [facility] OR;  Service: General;  Laterality: N/A;   DILATION AND CURETTAGE OF UTERUS     ERCP N/A 05/05/2020   Procedure: ENDOSCOPIC RETROGRADE CHOLANGIOPANCREATOGRAPHY (ERCP);  Surgeon: Rollin Dover, MD;  Location: [facility]  ENDOSCOPY;  Service: Endoscopy;  Laterality: N/A;   EYE SURGERY     OCULAR PROSTHESIS REMOVAL     Left eye    REMOVAL OF STONES  05/05/2020   Procedure: REMOVAL OF STONES;  Surgeon: Rollin Dover, MD;  [facility];  Service: Endoscopy;;   SPHINCTEROTOMY  05/05/2020   Procedure: ANNETT;  Surgeon: Rollin Dover, MD;  Location: [facility] ENDOSCOPY;  Service: Endoscopy;;   TONSILLECTOMY     Patient Active Problem List   Diagnosis Date Noted   Low vision of left eye 04/13/2024   Ataxia 04/13/2024   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   Neuropathy 03/25/2022   Hyperlipidemia 05/25/2019   Cervical osteoarthritis 02/17/2017   Dementia of the Alzheimer's type, with late onset, with depressed mood (HCC) 08/05/2016   Stress incontinence 06/20/2016   Calcification of aorta (HCC) 04/15/2016   Recurrent falls 04/15/2016   Health care maintenance 09/21/2014   H/O enucleation of right eyeball; glaucoma 06/27/2011   Essential hypertension 04/22/2007    PCP: [name], MD  REFERRING PROVIDER: Karna Fellows MD  REFERRING DIAG: h/ o falls  THERAPY DIAG:  Difficulty in walking, not elsewhere classified  Rationale for Evaluation and Treatment: Rehabilitation  ONSET DATE: 01/29/24  SUBJECTIVE:   SUBJECTIVE STATEMENT: Patient and som in law inquire about D/C  PERTINENT HISTORY: Referred by PCP after office visit due to pt falling  a lot  PAIN:  Are you having pain? no  PRECAUTIONS: Fall  RED FLAGS: Cognitive, asked 5 x during evaluation why am I here?   WEIGHT BEARING RESTRICTIONS: No  FALLS:  Has patient fallen in last 6 months? Yes. Number of falls 3  LIVING ENVIRONMENT: Lives with: lives with their family Lives in: House/apartment Stairs: Yes: External: flight steps; on right going up Has following equipment at home: blind cane  OCCUPATION: retired  PLOF: Independent  PATIENT GOALS: not fall  NEXT MD VISIT: unknown  OBJECTIVE:  Note: Objective  measures were completed at Evaluation unless otherwise noted.  DIAGNOSTIC FINDINGS: na   COGNITION: Overall cognitive status: Impaired     SENSATION: WFL  EDEMA:  None noted  POSTURE: standing posture with marked R trunk lean  PALPATION: na  LOWER EXTREMITY ROM: all wfl   LOWER EXTREMITY MMT: all mmt grossly wnl except B ankles 4-/5  MMT Right eval Left eval  Hip flexion    Hip extension    Hip abduction    Hip adduction    Hip internal rotation    Hip external rotation    Knee flexion    Knee extension    Ankle dorsiflexion    Ankle plantarflexion    Ankle inversion    Ankle eversion     (Blank rows = not tested)  LOWER EXTREMITY SPECIAL TESTS:  na  FUNCTIONAL TESTS:  30 seconds chair stand test  8  Berg 31/54  GAIT: Distance walked: in clinic 26' Assistive device utilized: blind cane and HHA Level of assistance:  Comments: no significant deviations, vision limits walking speed                                                                                                                                TREATMENT DATE:  04/21/24 Nustep level 5 x 5 minutes Gait outside walking HHA around the building Went over safety in the home, talked about balance Issued HEP and then went over and performed, spoke with her son in law about it  04/14/24 Nustep Level 5 x 6 minutes Gait outside the parking [place], went out front door and around building in grass to the parking [place], needed a sitting rest break 20# HS curls 2x10 5# Knee extension 2 x10 LEg press 20#  04/12/24 Nustep level 5 x 6 minutes Gait outside around the parking [employer] Direction changes HHA 2.5# marches 2.5# hip abduction Leg curls 15# Leg extension 5# Ball toss 8 toe touches Ball in lap isometrics  04/07/24 Nustep level 5 x 6 minutes Gait outside with holding PT elbow 1/2 way around the parking island Leg curls 15# 2x10 5# leg extension 3x5 2.5# hip abduction with walker  2x10 2.5# hip extension with walker 2x10 Supine feet on ball K2C, rotation, bridge, isometric abs  04/05/24 Nustep level 5 x 6 minutes 2.5# marches 2.5# LAQ 2.5# hip abduction with walker 15# HS curls Direction  changes Fast walking with HHA SPC 6 toe touches Ball toss and bounce On airex balance beam side stepping  04/01/24 Bike level 3 x 5 minutes 2.5# LAQ 2x10 2.5# marches 2x10 Ball b/n knees squeeze BP 135/80 Direction changes On airex standing CGA Yellow tband clamshells Seated toe raises Seated heel raises Yellow tband rows in sitting  03/15/24: evaluation, pt 15 min late    PATIENT EDUCATION:  Education details: POC Person educated: pt, daughter, son in Social worker Education method: Explanation, Demonstration, Actor cues, and Verbal cues Education comprehension: verbalized understanding, returned demonstration, verbal cues required, and tactile cues required  HOME EXERCISE PROGRAM: Access Code: [email or web address] URL: [email or web address] Date: 04/21/2024 Prepared by: Ozell Mainland  Exercises - Standing Shoulder Row with Anchored Resistance  - 1 x daily - 7 x weekly - 3 sets - 10 reps - 3 hold - Shoulder extension with resistance - Neutral  - 1 x daily - 7 x weekly - 3 sets - 10 reps - 3 hold - Standing Bicep Curls with Resistance Band with PLB  - 1 x daily - 7 x weekly - 3 sets - 10 reps - 3 hold - Seated Hip Abduction with Resistance  - 1 x daily - 7 x weekly - 3 sets - 10 reps - 3 hold - Seated Ankle Plantarflexion with Resistance  - 1 x daily - 7 x weekly - 3 sets - 10 reps - 3 hold  ASSESSMENT:  CLINICAL IMPRESSION: Talked with patient and son about discharge which is what they would like, went over HEP, safety in the home and some balance with them both, they verbalized understanding  Patient is a 88 y.o. female who was evaluated today by physical therapy  for balance deficits frequent falls.  She was attended by her daughter and son and law.  Has  had several falls at her home. Her history was inconsistent, per her family the pts granddaughter assists the pt with bathing, dressing, meals.  Pt stated that she performs these activities I.  The pt is a fall risk with Berg score of 31/54. Will benefit from repetitive balance training and strength training B ankles.   OBJECTIVE IMPAIRMENTS: decreased balance, decreased cognition, decreased knowledge of condition, decreased strength, decreased safety awareness, and impaired vision/preception.   ACTIVITY LIMITATIONS: carrying, standing, stairs, transfers, and locomotion level  PARTICIPATION LIMITATIONS: cleaning, laundry, shopping, and community activity  PERSONAL FACTORS: Age, Time since onset of injury/illness/exacerbation, Transportation, and 1-2 comorbidities: dementia, low vision, blind R eye, low vision L eye are also affecting patient's functional outcome.   REHAB POTENTIAL: Fair due to cognition  CLINICAL DECISION MAKING: Evolving/moderate complexity  EVALUATION COMPLEXITY: Moderate   GOALS: Goals reviewed with patient? Yes  SHORT TERM GOALS: Target date: 2 weeks 03/29/24  I HEP Baseline: Goal status: met 04/07/24  LONG TERM GOALS: Target date: 12 weeks 06/07/24  Berg increase from 31/54 to 45 /54 for decreased fall risk Baseline:  Goal status: not met  2.  Ankle strength improve from 4-5/ to 4+/5 for better stability Baseline:  Goal status: met 04/21/24  3.  30 sec sit to stand improve from 8 to 10 Baseline:  Goal status: met 04/21/24    PLAN:  PT FREQUENCY: 2x/week  PT DURATION: 12 weeks  PLANNED INTERVENTIONS: 97110-Therapeutic exercises, 97530- Therapeutic activity, 97112- Neuromuscular re-education, 97535- Self Care, and 02859- Manual therapy  PLAN FOR NEXT SESSION:D/C most goals met   MAINLAND OZELL ORN, PT,  04/21/2024, 4:12 PM

## 2024-05-10 NOTE — Telephone Encounter (Signed)
 Referral was re directed to the following office:  [facility] [address] [place]  [phone]  May 06, 2024  2:22 PM Graeme W wrote: Reason for CRM: [facility] called states what patient needs they are unable to provide. She needs Glaucoma specialist due to blind in one eye and low vision other and non compliance with drops- can try to send referral to [facility]. Thank You

## 2024-06-02 ENCOUNTER — Other Ambulatory Visit: Payer: Self-pay

## 2024-06-02 DIAGNOSIS — I10 Essential (primary) hypertension: Secondary | ICD-10-CM

## 2024-06-02 DIAGNOSIS — E7800 Pure hypercholesterolemia, unspecified: Secondary | ICD-10-CM

## 2024-06-02 DIAGNOSIS — G301 Alzheimer's disease with late onset: Secondary | ICD-10-CM

## 2024-06-02 MED ORDER — PRAVASTATIN SODIUM 20 MG PO TABS: 20.0000 mg | ORAL_TABLET | Freq: Every day | ORAL | 3 refills | Status: AC

## 2024-06-02 MED ORDER — AMLODIPINE BESYLATE 10 MG PO TABS: 10.0000 mg | ORAL_TABLET | Freq: Every day | ORAL | 3 refills | Status: AC

## 2024-06-02 MED ORDER — CITALOPRAM HYDROBROMIDE 10 MG PO TABS: 10.0000 mg | ORAL_TABLET | Freq: Every day | ORAL | 11 refills | Status: AC

## 2024-06-02 NOTE — Telephone Encounter (Signed)
 Pravastatin  sent to the pharmacy. Amlodipine  rx expired

## 2024-07-05 ENCOUNTER — Encounter: Payer: Self-pay | Admitting: Neurology

## 2024-07-05 ENCOUNTER — Ambulatory Visit: Admitting: Neurology

## 2024-07-05 VITALS — BP 148/74 | HR 81 | Ht 65.0 in | Wt 131.0 lb

## 2024-07-05 DIAGNOSIS — F02B11 Dementia in other diseases classified elsewhere, moderate, with agitation: Secondary | ICD-10-CM | POA: Diagnosis not present

## 2024-07-05 DIAGNOSIS — F02818 Dementia in other diseases classified elsewhere, unspecified severity, with other behavioral disturbance: Secondary | ICD-10-CM | POA: Diagnosis not present

## 2024-07-05 DIAGNOSIS — G301 Alzheimer's disease with late onset: Secondary | ICD-10-CM | POA: Diagnosis not present

## 2024-07-05 MED ORDER — MEMANTINE HCL 10 MG PO TABS: 10.0000 mg | ORAL_TABLET | Freq: Two times a day (BID) | ORAL | 11 refills | Status: AC

## 2024-07-05 MED ORDER — DONEPEZIL HCL 10 MG PO TABS: 10.0000 mg | ORAL_TABLET | Freq: Every day | ORAL | 3 refills | Status: AC

## 2024-07-05 NOTE — Progress Notes (Signed)
 Chief Complaint  Patient presents with   New Patient (Initial Visit)    Pt in room 14.daughter in room. Internal referral for progressive memory loss, new upper extremity bilateral spasms, gait instability. MOCA:14      ASSESSMENT AND PLAN  [name] is a 88 y.o. female   Dementia  Most likely central nervous some degenerative disorder, such as Alzheimer's disease, mother suffered dementia,  MoCA examination 14/30, profound short-term memory loss  Laboratory evaluation showed no treatable etiology  Previous MRI of the brain showed mild generalized atrophy small vessel disease  She is tolerating Aricept  5 mg daily, will increase to 10 mg daily, add on Namenda 10 mg twice a day,   Encouraged her moderate exercise   She is to continue follow-up and refill by her primary care physician only return to clinic for new issues,  DIAGNOSTIC DATA (LABS, IMAGING, TESTING) - I reviewed patient records, labs, notes, testing and imaging myself where available.   MEDICAL HISTORY:  [name], is a 88 year old female accompanied by her daughter seen in request by her primary care doctor  [name], for evaluation of memory loss, with agitation, initial evaluation July 05, 2024  History is obtained from the patient and review of electronic medical records. I personally reviewed pertinent available imaging films in PACS.   PMHx of  Lost right eye due to glaucoma HTN HLD  Her mother suffered dementia died at age 31, she lives with her adult granddaughter, was noted to have gradual onset of memory loss over the past few years, slowly getting worse, she is a homemaker, has 2 children, but today, she could not remember whether her daughter was the first born or her son,  She has lost her right eye due to glaucoma, has been agitated, wants to make her to eyes looked even, hard to reasoning  She eats well, sleeps well, enjoying painting, she haspainting as her hobby all her  life, tends to be sedentary, tolerating Aricept  5 mg daily   Had MRI of brain in April 2023, mild small vessel disease generalized atrophy no acute abnormality CT angiogram head and neck, intracranial atherosclerotic disease, no significant large vessel disease  Laboratory evaluation in June 2025 normal B12, TSH, CMP showed mild elevation of creatinine with slight decreased GFR 49  PHYSICAL EXAM:   Vitals:   07/05/24 1552  BP: (!) 148/74  Pulse: 81  Weight: 131 lb (59.4 kg)  Height: 5' 5 (1.651 m)   Body mass index is 21.8 kg/m.  PHYSICAL EXAMNIATION:  Gen: NAD, conversant, well nourised, well groomed                     Cardiovascular: Regular rate rhythm, no peripheral edema, warm, nontender. Eyes: Conjunctivae clear without exudates or hemorrhage Neck: Supple, no carotid bruits. Pulmonary: Clear to auscultation bilaterally   NEUROLOGICAL EXAM:  MENTAL STATUS: Speech/cognition: Awake, alert, oriented to history taking and casual conversation    07/05/2024    3:55 PM  Montreal Cognitive Assessment   Visuospatial/ Executive (0/5) 3  Naming (0/3) 2  Attention: Read list of digits (0/2) 2  Attention: Read list of letters (0/1) 1  Attention: Serial 7 subtraction starting at 100 (0/3) 0  Language: Repeat phrase (0/2) 1  Language : Fluency (0/1) 1  Abstraction (0/2) 2  Delayed Recall (0/5) 0  Orientation (0/6) 2  Total 14    CRANIAL NERVES: CN II: Right eye blind, OS 20/50 CN III, IV, VI:  extraocular movement are normal. No ptosis. CN V: Facial sensation is intact to light touch CN VII: Face is symmetric with normal eye closure  CN VIII: Hearing is normal to causal conversation. CN IX, X: Phonation is normal. CN XI: Head turning and shoulder shrug are intact  MOTOR: There is no pronator drift of out-stretched arms. Muscle bulk and tone are normal. Muscle strength is normal.  REFLEXES: Reflexes are 1 and symmetric at the biceps, triceps, knees, and ankles.  Plantar responses are flexor.  SENSORY: Mildly length-dependent decreased vibratory sensation COORDINATION: There is no trunk or limb dysmetria noted.  GAIT/STANCE: Push-up cautious  REVIEW OF SYSTEMS:  Full 14 system review of systems performed and notable only for as above All other review of systems were negative.   ALLERGIES: Allergies  Allergen Reactions   Aspirin     REACTION: gastritis with GERD   Codeine Nausea Only    HOME MEDICATIONS: Current Outpatient Medications  Medication Sig Dispense Refill   amLODipine  (NORVASC ) 10 MG tablet Take 1 tablet (10 mg total) by mouth daily. 90 tablet 3   citalopram  (CELEXA ) 10 MG tablet Take 1 tablet (10 mg total) by mouth daily. 30 tablet 11   donepezil  (ARICEPT ) 5 MG tablet Take 1 tablet (5 mg total) by mouth at bedtime. 30 tablet 2   gabapentin  (NEURONTIN ) 100 MG capsule TAKE 2 CAPSULES BY MOUTH IN THE MORNING, AND 4 CAPSULES AT BEDTIME 180 capsule 2   lisinopril  (ZESTRIL ) 40 MG tablet Take 1 tablet (40 mg total) by mouth daily. 90 tablet 3   nitroGLYCERIN  (NITROSTAT ) 0.4 MG SL tablet NEW PRESCRIPTION REQUEST: DISSOLVE 1 TABLET UNDER THE TONGUE EVERY 5 MINUTES AS NEEDED FOR CHEST PAIN. DO NOT EXCEED A TOTAL OF 3 DOSES IN 15 MINUTES. 75 tablet 0   pravastatin  (PRAVACHOL ) 20 MG tablet Take 1 tablet (20 mg total) by mouth daily. 90 tablet 3   No current facility-administered medications for this visit.    PAST MEDICAL HISTORY: Past Medical History:  Diagnosis Date   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   Arthritis    Blindness of right eye with low vision in contralateral eye 06/27/2011   Patient right eye is enucleated due to end stage glaucoma and she has a prosthesis in place. Left eye sees shadows and is bothered by bright lights. She wears specialty sunglasses which help her with sensitivity.    CAD (coronary artery disease)    nonobstructive, myoview normal 03/2007, done by Dr. Edith - EF = 75%   Dementia [facility])    Dementia  of the Alzheimer's type, with late onset, with depressed mood (HCC) 08/05/2016   Glaucoma    H/O enucleation of right eyeball 06/27/2011   Patient right eye is enucleated due to end stage glaucoma and she has a prosthesis in place. Left eye sees shadows and is bothered by bright lights. She wears specialty sunglasses which help her with sensitivity.     HLD (hyperlipidemia)    Hypertension    Recurrent falls 04/15/2016    PAST SURGICAL HISTORY: Past Surgical History:  Procedure Laterality Date   CARDIAC CATHETERIZATION     CHOLECYSTECTOMY N/A 05/04/2020   Procedure: LAPAROSCOPIC CHOLECYSTECTOMY WITH INTRAOPERATIVE CHOLANGIOGRAM;  Surgeon: Curvin Deward MOULD, MD;  Location: [facility] OR;  Service: General;  Laterality: N/A;   DILATION AND CURETTAGE OF UTERUS     ERCP N/A 05/05/2020   Procedure: ENDOSCOPIC RETROGRADE CHOLANGIOPANCREATOGRAPHY (ERCP);  Surgeon: Rollin Dover, MD;  Location: [facility] ENDOSCOPY;  Service: Endoscopy;  Laterality:  N/A;   EYE SURGERY     OCULAR PROSTHESIS REMOVAL     Left eye    REMOVAL OF STONES  05/05/2020   Procedure: REMOVAL OF STONES;  Surgeon: Rollin Dover, MD;  Location: [facility] ENDOSCOPY;  Service: Endoscopy;;   SPHINCTEROTOMY  05/05/2020   Procedure: ANNETT;  Surgeon: Rollin Dover, MD;  Location: [facility] ENDOSCOPY;  Service: Endoscopy;;   TONSILLECTOMY      FAMILY HISTORY: Family History  Problem Relation Age of Onset   Stroke Neg Hx    Cancer Neg Hx     SOCIAL HISTORY: Social History   Socioeconomic History   Marital status: Widowed    Spouse name: Not on file   Number of children: 2   Years of education: Not on file   Highest education level: 12th grade  Occupational History   Occupation: retired  Tobacco Use   Smoking status: Former    Types: Cigarettes   Smokeless tobacco: Never  Vaping Use   Vaping status: Never Used  Substance and Sexual Activity   Alcohol use: No   Drug use: No   Sexual activity: Not Currently  Other Topics Concern   Not on  file  Social History Narrative   Has a son and a daughter, granddaughter spends a lot of time at her house. Sister and she live together for many years. Sister was living with her mother and Ms. Mirelez was married but the mother passed away and her husband passed away so they moved in together.      Pt is right handed, widowed, and  just recently moved out of her 2 story house into a 1st floor apartment.  She drinks 2 glasses of green tea daily, and is somewhat active, though is limited by her inability to see. Pt previously ran an adult day care center.    Social Drivers of Health   Financial Resource Strain: High Risk (11/29/2022)   Overall Financial Resource Strain (CARDIA)    Difficulty of Paying Living Expenses: Hard  Food Insecurity: Food Insecurity Present (11/29/2022)   Hunger Vital Sign    Worried About Running Out of Food in the Last Year: Often true    Ran Out of Food in the Last Year: Often true  Transportation Needs: No Transportation Needs (11/29/2022)   PRAPARE - Administrator, Civil Service (Medical): No    Lack of Transportation (Non-Medical): No  Physical Activity: Inactive (11/29/2022)   Exercise Vital Sign    Days of Exercise per Week: 0 days    Minutes of Exercise per Session: 0 min  Stress: No Stress Concern Present (11/29/2022)   [employer] of Occupational Health - Occupational Stress Questionnaire    Feeling of Stress : Not at all  Social Connections: Socially Isolated (11/29/2022)   Social Connection and Isolation Panel    Frequency of Communication with Friends and Family: Three times a week    Frequency of Social Gatherings with Friends and Family: Twice a week    Attends Religious Services: Never    Database Administrator or Organizations: No    Attends Banker Meetings: Never    Marital Status: Widowed  Intimate Partner Violence: Not At Risk (11/29/2022)   Humiliation, Afraid, Rape, and Kick questionnaire    Fear of Current or  Ex-Partner: No    Emotionally Abused: No    Physically Abused: No    Sexually Abused: No      Modena Callander, M.D. Ph.D.  [facility] [address] [place], [place] [address] Ph: [phone] Fax: [phone]  CC:  Karna Fellows, MD [address] [place], [address] [place],  [place] [address]  Diamond Mliss Dragon, MD

## 2024-07-07 ENCOUNTER — Ambulatory Visit

## 2024-07-07 VITALS — Ht 66.0 in | Wt 131.0 lb

## 2024-07-07 DIAGNOSIS — Z0000 Encounter for general adult medical examination without abnormal findings: Secondary | ICD-10-CM

## 2024-07-07 NOTE — Progress Notes (Signed)
 I connected with  [name] on 07/07/24 by a audio enabled telemedicine application and verified that I am speaking with the correct person using two identifiers.  Patient Location: Home  Provider Location: Office/Clinic  Persons Participating in Visit: Patient.  I discussed the limitations of evaluation and management by telemedicine. The patient expressed understanding and agreed to proceed.   Vital Signs: Because this visit was a virtual/telehealth visit, some criteria may be missing or patient reported. Any vitals not documented were not able to be obtained and vitals that have been documented are patient reported.   If you're able to add these things as option in the [employer], we won't need it ... but for those who refuse to use the template, I guess it does need to be updated    Chief Complaint  Patient presents with   Medicare Wellness    SUBSEQUENT     Subjective:   [name] is a 88 y.o. female who presents for a Medicare Annual Wellness Visit.  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 Left eye sees shadows and is bothered by bright lights. She wears specialty sunglasses which help her with sensitivity.    CAD (coronary artery disease)    nonobstructive, myoview normal 03/2007, done by Dr. Edith - EF = 75%   Dementia [facility])    Dementia of the Alzheimer's type, with late onset, with depressed mood (HCC) 08/05/2016   Glaucoma    H/O enucleation of right eyeball 06/27/2011   Patient right eye is enucleated due to end stage glaucoma and she has a prosthesis in place. Left eye sees shadows and is bothered by bright lights. She wears specialty sunglasses which help her with  sensitivity.     HLD (hyperlipidemia)    Hypertension    Recurrent falls 04/15/2016   Past Surgical History:  Procedure Laterality Date   CARDIAC CATHETERIZATION     CHOLECYSTECTOMY N/A 05/04/2020   Procedure: LAPAROSCOPIC CHOLECYSTECTOMY WITH INTRAOPERATIVE CHOLANGIOGRAM;  Surgeon: Curvin Deward MOULD, MD;  Location: [facility] OR;  Service: General;  Laterality: N/A;   DILATION AND CURETTAGE OF UTERUS     ERCP N/A 05/05/2020   Procedure: ENDOSCOPIC RETROGRADE CHOLANGIOPANCREATOGRAPHY (ERCP);  Surgeon: Rollin Dover, MD;  Location: [facility] ENDOSCOPY;  Service: Endoscopy;  Laterality: N/A;   EYE SURGERY     OCULAR PROSTHESIS REMOVAL     Left eye    REMOVAL OF STONES  05/05/2020   Procedure: REMOVAL OF STONES;  Surgeon: Rollin Dover, MD;  Location: [facility] ENDOSCOPY;  Service: Endoscopy;;   SPHINCTEROTOMY  05/05/2020   Procedure: ANNETT;  Surgeon: Rollin Dover, MD;  Location: [facility] ENDOSCOPY;  Service: Endoscopy;;   TONSILLECTOMY     Family History  Problem Relation Age of Onset   Stroke Neg Hx    Cancer Neg Hx    Social History   Occupational History   Occupation: retired  Tobacco Use   Smoking status: Former    Types: Cigarettes   Smokeless tobacco: Never  Vaping Use   Vaping status: Never Used  Substance and Sexual Activity   Alcohol use: No   Drug use: No   Sexual activity: Not Currently   Tobacco Counseling Counseling given: Not Answered  SDOH Screenings   Food Insecurity: No Food Insecurity (07/07/2024)  Housing: Low Risk  (07/07/2024)  Transportation Needs: No Transportation Needs (07/07/2024)  Utilities: Not At Risk (07/07/2024)  Alcohol Screen: Low Risk  (11/29/2022)  Depression (PHQ2-9): Low Risk  (07/07/2024)  Financial Resource Strain: High Risk (11/29/2022)  Physical Activity: Inactive (07/07/2024)  Social Connections: Socially Isolated (07/07/2024)  Stress: No Stress Concern Present (07/07/2024)  Tobacco Use: Medium Risk (07/07/2024)   See flowsheets for full screening  details  Depression Screen PHQ 2 & 9 Depression Scale- Over the past 2 weeks, how often have you been bothered by any of the following problems? Little interest or pleasure in doing things: 0 Feeling down, depressed, or hopeless (PHQ Adolescent also includes...irritable): 0 PHQ-2 Total Score: 0 Trouble falling or staying asleep, or sleeping too much: 0 Feeling tired or having little energy: 0 Poor appetite or overeating (PHQ Adolescent also includes...weight loss): 0 Feeling bad about yourself - or that you are a failure or have let yourself or your family down: 0 Trouble concentrating on things, such as reading the newspaper or watching television (PHQ Adolescent also includes...like school work): 3 Moving or speaking so slowly that other people could have noticed. Or the opposite - being so fidgety or restless that you have been moving around a lot more than usual: 0 Thoughts that you would be better off dead, or of hurting yourself in some way: 0 PHQ-9 Total Score: 3 If you checked off any problems, how difficult have these problems made it for you to do your work, take care of things at home, or get along with other people?: Not difficult at all  Depression Treatment Depression Interventions/Treatment : EYV7-0 Score <4 Follow-up Not Indicated     Goals Addressed             This Visit's Progress    07/07/2024: To maintain and stay independent.         Visit info / Clinical Intake: Medicare Wellness Visit Type:: Subsequent Annual Wellness Visit Persons participating in visit:: patient Medicare Wellness Visit Mode:: Telephone If telephone:: video declined Because this visit was a virtual/telehealth visit:: pt reported vitals If Telephone or Video please confirm:: I discussed the limitations of evaluation and management by telemedicine; I connected with the patient using audio enabled telemedicine application and verified that I am speaking with the correct person using two  identifiers; The patient expressed understanding and agreed to proceed Patient Location:: HOME Provider Location:: [facility] Information given by:: patient Interpreter Needed?: No Pre-visit prep was completed: yes AWV questionnaire completed by patient prior to visit?: no Living arrangements:: with family/others Patient's Overall Health Status Rating: good Typical amount of pain: none Does pain affect daily life?: no Are you currently prescribed opioids?: no  Dietary Habits and Nutritional Risks How many meals a day?: 3 Eats fruit and vegetables daily?: yes Most meals are obtained by: preparing own meals; having others provide food (GRANDDAUGHTER) In the last 2 weeks, have you had any of the following?: none Diabetic:: no  Functional Status Activities of Daily Living (to include ambulation/medication): (!) Needs Assist Feeding: Independent Dressing/Grooming: Independent Bathing: Independent Toileting: Independent Transfer: Independent Ambulation: Independent with device- listed below Home Assistive Devices/Equipment: Johna Finder (specify Type); Dentures (specify type); Other (Comment); Shower/tub chair (grab bars, elevated toilet seat) Medication Administration: Independent Home Management: Independent Manage your own finances?: yes Primary transportation is: family/friends Concerns about vision?: (!) yes Concerns about hearing?: no  Fall Screening Falls in the past  year?: 0 Number of falls in past year: 0 Was there an injury with Fall?: 0 Fall Risk Category Calculator: 0 Patient Fall Risk Level: Low Fall Risk  Fall Risk Patient at Risk for Falls Due to: History of fall(s) Fall risk Follow up: Falls evaluation completed; Education provided  Home and Transportation Safety: All rugs have non-skid backing?: N/A, no rugs All stairs or steps have railings?: N/A, no stairs Grab bars in the bathtub or shower?: (!) no Have non-skid surface in bathtub or  shower?: (!) no Good home lighting?: yes Regular seat belt use?: yes Hospital stays in the last year:: no  Cognitive Assessment Difficulty concentrating, remembering, or making decisions? : yes Will 6CIT or Mini Cog be Completed: no 6CIT or Mini Cog Declined: patient has a diagnosis of dementia or cognitive impairment (Cognitive Testing was completed by Neuro on 07/05/2024. With a score of 14.)  Advance Directives (For Healthcare) Does Patient Have a Medical Advance Directive?: Yes Type of Advance Directive: Healthcare Power of Attorney Copy of Healthcare Power of Attorney in Chart?: Yes - validated most recent copy scanned in chart (See row information)  Reviewed/Updated  Reviewed/Updated: Reviewed All (Medical, Surgical, Family, Medications, Allergies, Care Teams, Patient Goals)        Objective:    Today's Vitals   07/07/24 1547  Weight: 131 lb (59.4 kg)  Height: 5' 6 (1.676 m)  PainSc: 0-No pain   Body mass index is 21.14 kg/m.  Current Medications (verified) Outpatient Encounter Medications as of 07/07/2024  Medication Sig   amLODipine  (NORVASC ) 10 MG tablet Take 1 tablet (10 mg total) by mouth daily.   citalopram  (CELEXA ) 10 MG tablet Take 1 tablet (10 mg total) by mouth daily.   donepezil  (ARICEPT ) 10 MG tablet Take 1 tablet (10 mg total) by mouth at bedtime.   gabapentin  (NEURONTIN ) 100 MG capsule TAKE 2 CAPSULES BY MOUTH IN THE MORNING, AND 4 CAPSULES AT BEDTIME   lisinopril  (ZESTRIL ) 40 MG tablet Take 1 tablet (40 mg total) by mouth daily.   memantine (NAMENDA) 10 MG tablet Take 1 tablet (10 mg total) by mouth 2 (two) times daily.   nitroGLYCERIN  (NITROSTAT ) 0.4 MG SL tablet NEW PRESCRIPTION REQUEST: DISSOLVE 1 TABLET UNDER THE TONGUE EVERY 5 MINUTES AS NEEDED FOR CHEST PAIN. DO NOT EXCEED A TOTAL OF 3 DOSES IN 15 MINUTES.   pravastatin  (PRAVACHOL ) 20 MG tablet Take 1 tablet (20 mg total) by mouth daily.   No facility-administered encounter medications on file  as of 07/07/2024.   Hearing/Vision screen Hearing Screening - Comments:: Adequate hearing, with no hearing aids. Vision Screening - Comments:: Patient has blindness of right eye with low vision in contralateral eye and Glaucoma. Immunizations and Health Maintenance Health Maintenance  Topic Date Due   Zoster Vaccines- Shingrix (1 of 2) Never done   Influenza Vaccine  03/26/2024   COVID-19 Vaccine (4 - 2025-26 season) 04/26/2024   Medicare Annual Wellness (AWV)  07/07/2025   DTaP/Tdap/Td (3 - Td or Tdap) 07/03/2031   Pneumococcal Vaccine: 50+ Years  Completed   DEXA SCAN  Completed   Meningococcal B Vaccine  Aged Out        Assessment/Plan:  This is a routine wellness examination for [name].  Patient Care Team: [name], MD as PCP - General (Internal Medicine)  I have personally reviewed and noted the following in the patient's chart:   Medical and social history Use of alcohol, tobacco or illicit drugs  Current medications and supplements including  opioid prescriptions. Functional ability and status Nutritional status Physical activity Advanced directives List of other physicians Hospitalizations, surgeries, and ER visits in previous 12 months Vitals Screenings to include cognitive, depression, and falls Referrals and appointments  No orders of the defined types were placed in this encounter.  In addition, I have reviewed and discussed with patient certain preventive protocols, quality metrics, and best practice recommendations. A written personalized care plan for preventive services as well as general preventive health recommendations were provided to patient.   Roz LOISE Fuller, LPN   [id number]/[id number]   Return in about 1 year (around 07/07/2025) for Medicare wellness.  After Visit Summary: (Declined) Due to this being a telephonic visit, with patients personalized plan was offered to patient but patient Declined AVS at this time   Nurse Notes: None at  this time.

## 2024-07-07 NOTE — Patient Instructions (Signed)
 [name],  Thank you for taking the time for your Medicare Wellness Visit. I appreciate your continued commitment to your health goals. Please review the care plan we discussed, and feel free to reach out if I can assist you further.  Please note that Annual Wellness Visits do not include a physical exam. Some assessments may be limited, especially if the visit was conducted virtually. If needed, we may recommend an in-person follow-up with your provider.  Ongoing Care Seeing your primary care provider every 3 to 6 months helps us  monitor your health and provide consistent, personalized care.   Referrals If a referral was made during today's visit and you haven't received any updates within two weeks, please contact the referred provider directly to check on the status.  Recommended Screenings:  Health Maintenance  Topic Date Due   Zoster (Shingles) Vaccine (1 of 2) Never done   Medicare Annual Wellness Visit  11/29/2023   Flu Shot  03/26/2024   COVID-19 Vaccine (4 - 2025-26 season) 04/26/2024   DTaP/Tdap/Td vaccine (3 - Td or Tdap) 07/03/2031   Pneumococcal Vaccine for age over 12  Completed   DEXA scan (bone density measurement)  Completed   Meningitis B Vaccine  Aged Out       07/07/2024    3:49 PM  Advanced Directives  Does Patient Have a Medical Advance Directive? Yes  Type of Advance Directive Healthcare Power of Attorney  Copy of Healthcare Power of Attorney in Chart? Yes - validated most recent copy scanned in chart (See row information)    Vision: Annual vision screenings are recommended for early detection of glaucoma, cataracts, and diabetic retinopathy. These exams can also reveal signs of chronic conditions such as diabetes and high blood pressure.  Dental: Annual dental screenings help detect early signs of oral cancer, gum disease, and other conditions linked to overall health, including heart disease and diabetes.  Please see the attached documents for  additional preventive care recommendations.

## 2024-08-31 ENCOUNTER — Other Ambulatory Visit: Payer: Self-pay | Admitting: *Deleted

## 2024-09-01 MED ORDER — GABAPENTIN 100 MG PO CAPS: ORAL_CAPSULE | ORAL | 2 refills | Status: AC

## 2024-09-14 ENCOUNTER — Ambulatory Visit: Payer: Self-pay

## 2024-09-14 VITALS — BP 128/72 | HR 76 | Temp 98.4°F | Ht 66.0 in | Wt 127.0 lb

## 2024-09-14 DIAGNOSIS — H543 Unqualified visual loss, both eyes: Secondary | ICD-10-CM

## 2024-09-14 DIAGNOSIS — I10 Essential (primary) hypertension: Secondary | ICD-10-CM

## 2024-09-14 DIAGNOSIS — Z23 Encounter for immunization: Secondary | ICD-10-CM | POA: Diagnosis not present

## 2024-09-14 DIAGNOSIS — K625 Hemorrhage of anus and rectum: Secondary | ICD-10-CM | POA: Diagnosis not present

## 2024-09-14 NOTE — Assessment & Plan Note (Signed)
 We did not discuss her hypertension today.  Her blood pressure 128/72 and pulse 76.  Patient expressed feeling more fatigued than usual.  Home regimen includes amlodipine  10 and lisinopril  40 daily.  I recommended that they get a blood pressure cuff and monitor the patient's blood pressures, if her systolics are less than 100 I recommended that patient only take a half of her lisinopril .  If she is having low blood pressures at home and if it could be contributing to her fatigue, we may be able to decrease her blood pressure regimen in the future. Plan Follow-up if they have been able to get a blood pressure cuff and if they were able to record any values Reevaluate blood pressure regimen at 1 month follow-up

## 2024-09-14 NOTE — Progress Notes (Signed)
 "  Established Patient Office Visit  Subjective   Patient ID: [name], female    DOB: 1936-06-07  Age: 89 y.o. MRN: [id number]  HPI Patient here with her daughter for an acute visit regarding blood in her stool.  Patient lives with her granddaughter and her granddaughter noticed there was bright red blood in the toilet bowl yesterday. Patient denies history of blood in her stool and daughter confirms this.  They do not know when her last colonoscopy was but it has been several years.  Patient says she has not had any pain with defecation or pain with eating or abdominal pain in general. Patient denies coughing up any brown or blood tinged sputum.   Patient has a normal bowel movement every day and has a good appetite.   She says that she loves food and is looking forward to eating breakfast after this appointment.  She denies night sweats or lymph nodes in her neck or groin.  She does feel like she is more tired than usual and she is especially tired of her twitching.  She has lost weight and has not been trying to, she think she is at a pretty good weight now. Aside from her twitching, decreased vision and a one-time bloody toilet bowl she has otherwise been in her normal state of health. 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 Left eye sees shadows and is bothered by bright lights. She wears specialty sunglasses which help her with sensitivity.    CAD (coronary artery disease)    nonobstructive, myoview normal 03/2007, done by Dr. Edith - EF = 75%   Dementia [facility])    Dementia of the Alzheimer's type, with late onset, with depressed mood (HCC) 08/05/2016   Glaucoma    H/O enucleation of right eyeball 06/27/2011   Patient right eye is enucleated due to end stage glaucoma and  she has a prosthesis in place. Left eye sees shadows and is bothered by bright lights. She wears specialty sunglasses which help her with sensitivity.     HLD (hyperlipidemia)    Hypertension    Recurrent falls 04/15/2016        Objective:     BP 128/72 (BP Location: Right Arm, Patient Position: Sitting, Cuff Size: Normal)   Pulse 76   Temp 98.4 F (36.9 C) (Oral)   Ht 5' 6 (1.676 m)   Wt 127 lb (57.6 kg)   BMI 20.50 kg/m  BP Readings from Last 3 Encounters:  09/14/24 128/72  07/05/24 (!) 148/74  04/13/24 125/62   Wt Readings from Last 3 Encounters:  09/14/24 127 lb (57.6 kg)  07/07/24 131 lb (59.4 kg)  07/05/24 131 lb (59.4 kg)      Physical Exam Vitals reviewed.  Constitutional:      Appearance: Normal appearance. She is normal weight. She is not ill-appearing.  HENT:     Nose: Nose normal.     Mouth/Throat:     Mouth: Mucous membranes are moist.     Pharynx: Oropharynx is clear.  Eyes:     Comments: Right eyelid closed Left cornea cloudy  Cardiovascular:     Rate and Rhythm: Normal rate and regular rhythm.     Pulses: Normal pulses.     Heart sounds:  Normal heart sounds.  Pulmonary:     Effort: Pulmonary effort is normal.     Breath sounds: Normal breath sounds.  Abdominal:     General: There is no distension.     Palpations: Abdomen is soft. There is no mass.     Tenderness: There is no abdominal tenderness. There is no guarding or rebound.  Musculoskeletal:     Comments: Myoclonus in upper right extremity, sporadic, inconsistent  Skin:    General: Skin is warm.  Neurological:     Comments: Oriented to self and place  Psychiatric:        Mood and Affect: Mood normal.        Behavior: Behavior normal.      No results found for any visits on 09/14/24.  Last CBC Lab Results  Component Value Date   WBC 5.6 12/15/2021   HGB 12.8 12/15/2021   HCT 37.7 12/15/2021   MCV 89.3 12/15/2021   MCH 30.3 12/15/2021   RDW 12.9 12/15/2021   PLT 230  12/15/2021   Last metabolic panel Lab Results  Component Value Date   GLUCOSE 85 01/29/2024   NA 144 01/29/2024   K 3.6 01/29/2024   CL 106 01/29/2024   CO2 21 01/29/2024   BUN 11 01/29/2024   CREATININE 1.09 (H) 01/29/2024   EGFR 49 (L) 01/29/2024   CALCIUM  9.4 01/29/2024   PROT 6.7 01/29/2024   ALBUMIN 4.2 01/29/2024   LABGLOB 2.5 01/29/2024   AGRATIO 1.6 03/25/2022   BILITOT 0.3 01/29/2024   ALKPHOS 147 (H) 01/29/2024   AST 13 01/29/2024   ALT 9 01/29/2024   ANIONGAP 8 12/14/2021      The ASCVD Risk score (Arnett DK, et al., 2019) failed to calculate for the following reasons:   The 2019 ASCVD risk score is only valid for ages 10 to 25   * - Cholesterol units were assumed    Assessment & Plan:   Assessment & Plan Bright red blood per rectum Since 09/2023, she has lost about 25 pounds.  Given her dementia, it is possible that she is eating less despite her subjectively normal appetite.  Although, I worry for malignancy given her weight loss, advanced age and no recent colonoscopy.  Given her otherwise benign review of systems and exam, differential for a one-time occurrence of painless hematochezia includes diverticular bleed, external hemorrhoids, colorectal polyp or neoplasm, angioectasias and less likely ischemic colitis given vitals stable, afebrile and painless.  Patient is currently not in distress, breathing comfortably on room air with stable vitals.  Discussed with patient and her daughter about the different options for working up the one-time painless hematochezia.  We discussed pursuing a colonoscopy or even a limited sigmoidoscopy versus obtaining labs today and continuing to monitor for future episodes or symptoms.  Discussed symptoms to watch out for such as continued bleeding, abdominal pain, rectal pain, chest pain, increased dizziness or fatigue.  Patient and family opted to start with labs today to assess her hemoglobin.  Ultimately I would like for this patient  to come back within a month and have a goals of care conversation , eventually they will need to update their advance directive (formally documented in 2021).  Last CBC was 2 years ago and hemoglobin was 12.8.  If her labs come back and her hemoglobin is low, can discuss referral to gastroenterology for further workup.  If her hemoglobin is stable, can manage with 1 month follow-up. Plan CBC and BMP today Follow-up in  1 month, or sooner if she has another episode of bleeding Continue goals of care conversations and the need to escalate workup for bleeding Orders:   CBC no Diff   Basic metabolic panel with GFR  Immunization due Given today. 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 Trudy in 03/2024 she was referred to ophthalmology for a history of glaucoma but concern for cataracts.  The referral is now closed and patient and family would still like to pursue this.  I have placed another referral. Plan Follow-up on referral at next office visit Orders:   Ambulatory referral to Ophthalmology  Essential hypertension We did not discuss her hypertension today.  Her blood pressure 128/72 and pulse 76.  Patient expressed feeling more fatigued than usual.  Home regimen includes amlodipine  10 and lisinopril  40 daily.  I recommended that they get a blood pressure cuff and monitor the patient's blood pressures, if her systolics are less than 100 I recommended that patient only take a half of her lisinopril .  If she is having low blood pressures at home and if it could be contributing to her fatigue, we may be able to decrease her blood pressure regimen in the future. Plan Follow-up if they have been able to get a blood pressure cuff and if they were able to record any values Reevaluate blood pressure regimen at 1 month follow-up     Return in about 4 weeks (around 10/12/2024) for goals of  care and 6 month follow up.    Viktoria King, DO "

## 2024-09-14 NOTE — Patient Instructions (Addendum)
 It was wonderful seeing you today!   1) We're going to do some labs today, I will call you with the results  2) If you notice anymore blood in the stool, feel short of breath, have chest pain, feel dizzy, or have any abdominal or rectal pain, please go to the emergency department.   3) Please monitor your BP at home. If you notice the top number is below 100, you can take half of your lisinopril  that day.   If you have any questions please feel free to the call the clinic at anytime at [phone].  Have a blessed day,  Dr. Charmayne

## 2024-09-15 ENCOUNTER — Ambulatory Visit: Payer: Self-pay

## 2024-09-15 LAB — CBC
Hematocrit: 39.6 % (ref 34.0–46.6)
Hemoglobin: 12.9 g/dL (ref 11.1–15.9)
MCH: 29.7 pg (ref 26.6–33.0)
MCHC: 32.6 g/dL (ref 31.5–35.7)
MCV: 91 fL (ref 79–97)
Platelets: 261 x10E3/uL (ref 150–450)
RBC: 4.34 x10E6/uL (ref 3.77–5.28)
RDW: 12.8 % (ref 11.7–15.4)
WBC: 4.4 x10E3/uL (ref 3.4–10.8)

## 2024-09-15 LAB — BASIC METABOLIC PANEL WITH GFR
BUN/Creatinine Ratio: 11 — ABNORMAL LOW (ref 12–28)
BUN: 14 mg/dL (ref 8–27)
CO2: 22 mmol/L (ref 20–29)
Calcium: 9.2 mg/dL (ref 8.7–10.3)
Chloride: 106 mmol/L (ref 96–106)
Creatinine, Ser: 1.26 mg/dL — ABNORMAL HIGH (ref 0.57–1.00)
Glucose: 107 mg/dL — ABNORMAL HIGH (ref 70–99)
Potassium: 4 mmol/L (ref 3.5–5.2)
Sodium: 146 mmol/L — ABNORMAL HIGH (ref 134–144)
eGFR: 42 mL/min/1.73 — ABNORMAL LOW

## 2024-09-15 NOTE — Progress Notes (Signed)
 Internal Medicine Clinic Attending  Case discussed with the resident at the time of the visit.  We reviewed the resident's history and exam and pertinent patient test results.  I agree with the assessment, diagnosis, and plan of care documented in the resident's note.

## 2024-09-17 NOTE — Telephone Encounter (Signed)
 Called [facility] back and spoke with the patient in regards to her Referral.  Copied from CRM [id number]. Topic: Referral - Status >> Sep 15, 2024 11:21 AM Susanna ORN wrote: Reason for CRM: Reynolds, with [facility], called stating that they received a referral for this patient. States that she spoke with the patient and was told it's only her eyelids that needs checked. Reynolds states that patient needs to go to [facility] instead.

## 2024-10-13 ENCOUNTER — Ambulatory Visit: Payer: Self-pay
